# Patient Record
Sex: Male | Born: 1935 | Race: White | Hispanic: No | Marital: Single | Smoking: Former smoker
Health system: Southern US, Community
[De-identification: ages and names within clinical notes are randomized; demographics above are authoritative.]

## PROBLEM LIST (undated history)

## (undated) DIAGNOSIS — N400 Enlarged prostate without lower urinary tract symptoms: Secondary | ICD-10-CM

## (undated) DIAGNOSIS — K229 Disease of esophagus, unspecified: Secondary | ICD-10-CM

## (undated) DIAGNOSIS — J189 Pneumonia, unspecified organism: Secondary | ICD-10-CM

## (undated) DIAGNOSIS — L570 Actinic keratosis: Secondary | ICD-10-CM

## (undated) DIAGNOSIS — E440 Moderate protein-calorie malnutrition: Secondary | ICD-10-CM

## (undated) DIAGNOSIS — E785 Hyperlipidemia, unspecified: Secondary | ICD-10-CM

## (undated) DIAGNOSIS — D638 Anemia in other chronic diseases classified elsewhere: Secondary | ICD-10-CM

## (undated) DIAGNOSIS — M109 Gout, unspecified: Secondary | ICD-10-CM

## (undated) DIAGNOSIS — H25019 Cortical age-related cataract, unspecified eye: Secondary | ICD-10-CM

## (undated) DIAGNOSIS — K219 Gastro-esophageal reflux disease without esophagitis: Secondary | ICD-10-CM

## (undated) DIAGNOSIS — C801 Malignant (primary) neoplasm, unspecified: Secondary | ICD-10-CM

## (undated) DIAGNOSIS — F0390 Unspecified dementia without behavioral disturbance: Secondary | ICD-10-CM

## (undated) DIAGNOSIS — I10 Essential (primary) hypertension: Secondary | ICD-10-CM

## (undated) DIAGNOSIS — M1990 Unspecified osteoarthritis, unspecified site: Secondary | ICD-10-CM

## (undated) DIAGNOSIS — K224 Dyskinesia of esophagus: Secondary | ICD-10-CM

## (undated) DIAGNOSIS — I739 Peripheral vascular disease, unspecified: Secondary | ICD-10-CM

## (undated) HISTORY — DX: Dyskinesia of esophagus: K22.4

## (undated) HISTORY — PX: FOOT SURGERY: SHX648

## (undated) HISTORY — DX: Unspecified osteoarthritis, unspecified site: M19.90

## (undated) HISTORY — DX: Essential (primary) hypertension: I10

## (undated) HISTORY — DX: Hyperlipidemia, unspecified: E78.5

## (undated) HISTORY — DX: Gastro-esophageal reflux disease without esophagitis: K21.9

## (undated) HISTORY — PX: ESOPHAGOGASTRODUODENOSCOPY: SHX1529

## (undated) HISTORY — DX: Benign prostatic hyperplasia without lower urinary tract symptoms: N40.0

## (undated) HISTORY — PX: JOINT REPLACEMENT: SHX530

## (undated) HISTORY — DX: Cortical age-related cataract, unspecified eye: H25.019

## (undated) HISTORY — DX: Unspecified dementia without behavioral disturbance: F03.90

## (undated) HISTORY — PX: TONSILLECTOMY: SUR1361

## (undated) HISTORY — DX: Actinic keratosis: L57.0

## (undated) HISTORY — DX: Pneumonia, unspecified organism: J18.9

## (undated) HISTORY — DX: Peripheral vascular disease, unspecified: I73.9

## (undated) HISTORY — DX: Moderate protein-calorie malnutrition: E44.0

## (undated) HISTORY — DX: Anemia in other chronic diseases classified elsewhere: D63.8

---

## 2007-05-07 ENCOUNTER — Emergency Department: Payer: Self-pay | Admitting: Emergency Medicine

## 2007-05-08 ENCOUNTER — Other Ambulatory Visit: Payer: Self-pay

## 2008-05-20 ENCOUNTER — Emergency Department: Payer: Self-pay | Admitting: Emergency Medicine

## 2008-12-28 ENCOUNTER — Ambulatory Visit: Payer: Self-pay | Admitting: Internal Medicine

## 2009-01-15 ENCOUNTER — Ambulatory Visit: Payer: Self-pay | Admitting: Gastroenterology

## 2009-02-23 DEATH — deceased

## 2009-10-28 ENCOUNTER — Inpatient Hospital Stay: Payer: Self-pay | Admitting: Internal Medicine

## 2009-12-17 ENCOUNTER — Ambulatory Visit: Payer: Self-pay | Admitting: Gastroenterology

## 2010-02-11 ENCOUNTER — Emergency Department: Payer: Self-pay | Admitting: Emergency Medicine

## 2014-05-22 ENCOUNTER — Ambulatory Visit: Payer: Self-pay | Admitting: Gastroenterology

## 2015-08-29 ENCOUNTER — Emergency Department (HOSPITAL_COMMUNITY)
Admission: EM | Admit: 2015-08-29 | Discharge: 2015-08-29 | Disposition: A | Discharge status: Home / Self Care | Payer: Medicare Other | Source: Home / Self Care | Attending: Emergency Medicine | Admitting: Emergency Medicine

## 2015-08-29 ENCOUNTER — Encounter (HOSPITAL_COMMUNITY): Payer: Self-pay | Admitting: Emergency Medicine

## 2015-08-29 ENCOUNTER — Emergency Department (HOSPITAL_COMMUNITY): Payer: Medicare Other

## 2015-08-29 DIAGNOSIS — Z79899 Other long term (current) drug therapy: Secondary | ICD-10-CM

## 2015-08-29 DIAGNOSIS — Z7982 Long term (current) use of aspirin: Secondary | ICD-10-CM

## 2015-08-29 DIAGNOSIS — R000 Tachycardia, unspecified: Secondary | ICD-10-CM

## 2015-08-29 DIAGNOSIS — R05 Cough: Secondary | ICD-10-CM

## 2015-08-29 DIAGNOSIS — Z87891 Personal history of nicotine dependence: Secondary | ICD-10-CM | POA: Insufficient documentation

## 2015-08-29 DIAGNOSIS — R410 Disorientation, unspecified: Secondary | ICD-10-CM

## 2015-08-29 DIAGNOSIS — Z8719 Personal history of other diseases of the digestive system: Secondary | ICD-10-CM

## 2015-08-29 DIAGNOSIS — R1110 Vomiting, unspecified: Secondary | ICD-10-CM | POA: Insufficient documentation

## 2015-08-29 DIAGNOSIS — M109 Gout, unspecified: Secondary | ICD-10-CM

## 2015-08-29 DIAGNOSIS — Z859 Personal history of malignant neoplasm, unspecified: Secondary | ICD-10-CM

## 2015-08-29 DIAGNOSIS — A419 Sepsis, unspecified organism: Secondary | ICD-10-CM | POA: Diagnosis not present

## 2015-08-29 DIAGNOSIS — R112 Nausea with vomiting, unspecified: Secondary | ICD-10-CM | POA: Diagnosis not present

## 2015-08-29 HISTORY — DX: Malignant (primary) neoplasm, unspecified: C80.1

## 2015-08-29 HISTORY — DX: Gout, unspecified: M10.9

## 2015-08-29 HISTORY — DX: Disease of esophagus, unspecified: K22.9

## 2015-08-29 LAB — COMPREHENSIVE METABOLIC PANEL
ALT: 12 U/L — ABNORMAL LOW (ref 17–63)
AST: 24 U/L (ref 15–41)
Albumin: 3.6 g/dL (ref 3.5–5.0)
Alkaline Phosphatase: 78 U/L (ref 38–126)
Anion gap: 11 (ref 5–15)
BILIRUBIN TOTAL: 1 mg/dL (ref 0.3–1.2)
BUN: 11 mg/dL (ref 6–20)
CHLORIDE: 105 mmol/L (ref 101–111)
CO2: 23 mmol/L (ref 22–32)
CREATININE: 0.76 mg/dL (ref 0.61–1.24)
Calcium: 9.3 mg/dL (ref 8.9–10.3)
Glucose, Bld: 111 mg/dL — ABNORMAL HIGH (ref 65–99)
POTASSIUM: 3.7 mmol/L (ref 3.5–5.1)
Sodium: 139 mmol/L (ref 135–145)
TOTAL PROTEIN: 6.4 g/dL — AB (ref 6.5–8.1)

## 2015-08-29 LAB — CBC WITH DIFFERENTIAL/PLATELET
BASOS ABS: 0 10*3/uL (ref 0.0–0.1)
Basophils Relative: 0 %
EOS PCT: 0 %
Eosinophils Absolute: 0 10*3/uL (ref 0.0–0.7)
HEMATOCRIT: 40.3 % (ref 39.0–52.0)
Hemoglobin: 13.1 g/dL (ref 13.0–17.0)
LYMPHS ABS: 0.4 10*3/uL — AB (ref 0.7–4.0)
LYMPHS PCT: 2 %
MCH: 28.9 pg (ref 26.0–34.0)
MCHC: 32.5 g/dL (ref 30.0–36.0)
MCV: 88.8 fL (ref 78.0–100.0)
MONO ABS: 1.1 10*3/uL — AB (ref 0.1–1.0)
MONOS PCT: 6 %
NEUTROS ABS: 15.9 10*3/uL — AB (ref 1.7–7.7)
Neutrophils Relative %: 92 %
PLATELETS: 209 10*3/uL (ref 150–400)
RBC: 4.54 MIL/uL (ref 4.22–5.81)
RDW: 13.9 % (ref 11.5–15.5)
WBC: 17.4 10*3/uL — ABNORMAL HIGH (ref 4.0–10.5)

## 2015-08-29 LAB — URINE MICROSCOPIC-ADD ON

## 2015-08-29 LAB — URINALYSIS, ROUTINE W REFLEX MICROSCOPIC
Glucose, UA: NEGATIVE mg/dL
HGB URINE DIPSTICK: NEGATIVE
Ketones, ur: 15 mg/dL — AB
Leukocytes, UA: NEGATIVE
NITRITE: NEGATIVE
PROTEIN: 100 mg/dL — AB
Specific Gravity, Urine: 1.02 (ref 1.005–1.030)
pH: 7 (ref 5.0–8.0)

## 2015-08-29 LAB — I-STAT TROPONIN, ED: TROPONIN I, POC: 0.01 ng/mL (ref 0.00–0.08)

## 2015-08-29 LAB — LIPASE, BLOOD: LIPASE: 23 U/L (ref 11–51)

## 2015-08-29 MED ORDER — LEVOFLOXACIN 750 MG PO TABS: 750.0000 mg | ORAL_TABLET | Freq: Every day | ORAL | Status: DC

## 2015-08-29 MED ORDER — LEVOFLOXACIN 750 MG PO TABS
750.0000 mg | ORAL_TABLET | Freq: Once | ORAL | Status: AC
  Administered 2015-08-29: 750 mg via ORAL
  Filled 2015-08-29: qty 1

## 2015-08-29 MED ORDER — SODIUM CHLORIDE 0.9 % IV BOLUS (SEPSIS): 500.0000 mL | Freq: Once | INTRAVENOUS | Status: DC

## 2015-08-29 MED ORDER — SODIUM CHLORIDE 0.9 % IV BOLUS (SEPSIS)
500.0000 mL | Freq: Once | INTRAVENOUS | Status: AC
  Administered 2015-08-29: 500 mL via INTRAVENOUS

## 2015-08-29 NOTE — ED Provider Notes (Signed)
CSN: [id number]     Arrival date & time 08/29/15  1859 History   First MD Initiated Contact with Patient 08/29/15 1920     Chief Complaint  Patient presents with  . Altered Mental Status      HPI  82 year old male with history of hiatal hernia and gout presenting after he became transiently altered and confused at his skilled nursing facility. Per the patient's son who accompanies him to the ED, the patient was seen swerving in his powered wheelchair and at the time was confused and answered questions inappropriately (did not know where he was). This acute period of confusion lasted for about 5 minutes. Patient has now returned back to his baseline. He had no focal motor or sensory deficits at the time and has acted normal since. He has no history of stroke or TIA. Patient has had a cough for the last 2 or 3 days and has been coughing up yellow sputum. He chronically vomits up small amounts of whatever he eats or drinks, and has done so for years secondary to his hiatal hernia. He denies dysuria, frequency, abdominal pain. No chest pain or shortness of breath.  Past Medical History  Diagnosis Date  . Cancer ([place])   . Esophageal abnormality   . Gout    History reviewed. No pertinent past surgical history. No family history on file. Social History  Substance Use Topics  . Smoking status: Former Research scientist (life sciences)  . Smokeless tobacco: None  . Alcohol Use: No    Review of Systems  Constitutional: Negative for fever, chills, activity change and appetite change.  HENT: Negative for congestion, facial swelling, rhinorrhea and sore throat.   Eyes: Negative for visual disturbance.  Respiratory: Positive for cough. Negative for shortness of breath and wheezing.   Cardiovascular: Negative for chest pain, palpitations and leg swelling.  Gastrointestinal: Positive for vomiting (chronic, at baseline). Negative for nausea, abdominal pain, diarrhea, constipation and blood in stool.  Genitourinary: Negative for  dysuria, frequency, hematuria, flank pain and difficulty urinating.  Musculoskeletal: Negative for myalgias, back pain, joint swelling, arthralgias, neck pain and neck stiffness.  Skin: Negative for rash.  Neurological: Negative for dizziness, tremors, seizures, syncope, facial asymmetry, speech difficulty, weakness, light-headedness, numbness and headaches.  Psychiatric/Behavioral: Positive for confusion (resolved). Negative for behavioral problems and agitation.      Allergies  Review of patient's allergies indicates no known allergies.  Home Medications   Prior to Admission medications   Medication Sig Start Date End Date Taking? Authorizing Provider  allopurinol (ZYLOPRIM) 100 MG tablet Take 300 mg by mouth daily.  07/06/15  Yes Historical Provider, MD  aspirin EC 81 MG tablet Take 81 mg by mouth daily.   Yes Historical Provider, MD  donepezil (ARICEPT) 5 MG tablet Take 5 mg by mouth every evening. 07/27/15  Yes Historical Provider, MD  finasteride (PROSCAR) 5 MG tablet Take 5 mg by mouth daily. 06/15/15  Yes Historical Provider, MD  hyoscyamine (LEVBID) 0.375 MG 12 hr tablet Take 0.375 mg by mouth 2 (two) times daily. Cramping 08/18/15  Yes Historical Provider, MD  loratadine (CLARITIN) 10 MG tablet Take 10 mg by mouth daily.   Yes Historical Provider, MD  omeprazole (PRILOSEC) 40 MG capsule Take 40 mg by mouth daily. 08/03/15  Yes Historical Provider, MD  VITAMIN E PO Take 1 capsule by mouth every evening.   Yes Historical Provider, MD  levofloxacin (LEVAQUIN) 750 MG tablet Take 1 tablet (750 mg total) by mouth daily. 08/29/15 09/07/15  Eyal Greenhaw Algernon Huxley, MD   BP 143/85 mmHg  Pulse 85  Temp(Src) 97.6 F (36.4 C) (Oral)  Resp 16  Ht 5\' 3"  (1.6 m)  Wt 70.308 kg  BMI 27.46 kg/m2  SpO2 95% Physical Exam  Constitutional: He is oriented to person, place, and time. He appears well-developed and well-nourished. No distress.  HENT:  Head: Normocephalic and atraumatic.  Right Ear:  External ear normal.  Left Ear: External ear normal.  Nose: Nose normal.  Mouth/Throat: Oropharynx is clear and moist. No oropharyngeal exudate.  Eyes: Conjunctivae are normal. Pupils are equal, round, and reactive to light. Right eye exhibits no discharge. Left eye exhibits no discharge. No scleral icterus.  Neck: Normal range of motion. Neck supple. No tracheal deviation present.  Cardiovascular: Regular rhythm and normal heart sounds.  Exam reveals no gallop and no friction rub.   No murmur heard. tachcyardic to 110 bpm  Pulmonary/Chest: Effort normal and breath sounds normal. No respiratory distress. He has no wheezes. He has no rales.  Abdominal: Soft. Bowel sounds are normal. He exhibits no distension and no mass. There is no tenderness. There is no rebound and no guarding.  Musculoskeletal: Normal range of motion. He exhibits no edema or tenderness.  Neurological: He is alert and oriented to person, place, and time. He exhibits normal muscle tone.  5/5 strength in the upper and lower extremities, with intact sensation to light touch. Normal speech.   Skin: Skin is warm and dry. No rash noted. He is not diaphoretic.  Psychiatric: He has a normal mood and affect. His behavior is normal. Judgment and thought content normal.    ED Course  Procedures (including critical care time) Labs Review Labs Reviewed  CBC WITH DIFFERENTIAL/PLATELET - Abnormal; Notable for the following:    WBC 17.4 (*)    Neutro Abs 15.9 (*)    Lymphs Abs 0.4 (*)    Monocytes Absolute 1.1 (*)    All other components within normal limits  COMPREHENSIVE METABOLIC PANEL - Abnormal; Notable for the following:    Glucose, Bld 111 (*)    Total Protein 6.4 (*)    ALT 12 (*)    All other components within normal limits  URINALYSIS, ROUTINE W REFLEX MICROSCOPIC (NOT AT [facility]) - Abnormal; Notable for the following:    Color, Urine AMBER (*)    APPearance CLOUDY (*)    Bilirubin Urine SMALL (*)    Ketones, ur 15 (*)     Protein, ur 100 (*)    All other components within normal limits  URINE MICROSCOPIC-ADD ON - Abnormal; Notable for the following:    Squamous Epithelial / LPF 0-5 (*)    Bacteria, UA RARE (*)    All other components within normal limits  LIPASE, BLOOD  I-STAT TROPOININ, ED    Imaging Review Dg Chest 2 View  08/29/2015  CLINICAL DATA:  Nausea and vomiting for 2-3 days EXAM: CHEST  2 VIEW COMPARISON:  None. FINDINGS: Cardiac shadow is at the upper limits of normal in size. Elevation of left hemidiaphragm is noted. No focal infiltrate or sizable effusion is seen. No acute bony abnormality is noted. IMPRESSION: No active cardiopulmonary disease. Electronically Signed   By: Inez Catalina M.D.   On: 08/29/2015 21:56   Ct Head Wo Contrast  08/29/2015  CLINICAL DATA:  Confusion. EXAM: CT HEAD WITHOUT CONTRAST TECHNIQUE: Contiguous axial images were obtained from the base of the skull through the vertex without intravenous contrast. COMPARISON:  None. FINDINGS:  Atrophy and periventricular small vessel disease is identified. There are likely some old lacunar infarcts as well in the deep white matter structures. No evidence of hemorrhage, acute infarction, edema, mass effect, extra-axial fluid collection, hydrocephalus or mass lesion. The skull is unremarkable. IMPRESSION: No acute findings. Atrophy, small vessel disease and old lacunar infarcts identified. Electronically Signed   By: Aletta Edouard M.D.   On: 08/29/2015 20:53   I have personally reviewed and evaluated these images and lab results as part of my medical decision-making.   EKG Interpretation   Date/Time:  Wednesday August 29 2015 20:52:21 EDT Ventricular Rate:  107 PR Interval:  222 QRS Duration: 79 QT Interval:  286 QTC Calculation: 381 R Axis:   56 Text Interpretation:  Sinus tachycardia Prolonged PR interval Borderline  repolarization abnormality Confirmed by Christy Gentles  MD, DONALD ([id number]) on  08/29/2015 9:01:40 PM      MDM    Final diagnoses:  Confusion   Patient is generally well-appearing. Per both the patient and his son, he is back to his cognitive baseline. Patient does not remember his period of confusion earlier and states that "it was just because I was tired."  CBC reveals leukocytosis to 17.4 with left shift. Patient is noted to be coughing up thick yellow sputum. No pneumonia on chest x-ray however given leukocytosis and history of productive cough, will treat clinically for pneumonia with ten-day course of Levaquin as patient resides in a nursing facility.   Troponin is 0.01 and there are no ischemic changes on EKG. Patient denies chest pain and I do not suspect cardiac etiology as the cause of the patient's episodes of confusion. No evidence of UTI on urinalysis the patient denies dysuria or frequency. Doubt CVA or TIA as patient had no focal neurologic deficits during the episode.  He was noted to be tachycardic to 110 bpm on arrival. Patient and his son both report that he does not drink enough fluids. He was given 1 L of normal saline while in the ED with resolution of his tachycardia.  I had an extensive discussion with the patient and his son regarding the need to return to the emergency department immediately should he develop fevers, shortness of breath, recurrence of confusion, worsening symptoms. They expressed agreement and understanding with this plan and the patient was discharged in good condition.    Antonetta Clanton Algernon Huxley, MD 08/29/15 [id number]  Ripley Fraise, MD 08/29/15 2351

## 2015-08-29 NOTE — ED Notes (Signed)
Patient till off unit. Explained plan of care to son at bedside.

## 2015-08-29 NOTE — ED Notes (Signed)
Patient going to xray

## 2015-08-29 NOTE — ED Notes (Signed)
Dr Adela Glimpse explains levaquin for pneumonia, after review of xray, symptoms with cough and elevated Deysy Schabel count, antibiotic is needed at this time.

## 2015-08-29 NOTE — ED Notes (Addendum)
[facility] from [facility], per staff, nausea/vomiting X 2-3 days (chronic), then was acutely altered pta, difficulty with electric wheelchair, mental status improved quickly, per EMS, is A/O on arrival, VSS,   156/92, ST 110, CBG 113  Pupils unequal at baseline Speech normal per staff

## 2015-08-29 NOTE — ED Notes (Signed)
Spoke with Dr. Christy Gentles, no antibiotics needed for urine. Planning for discharge home.

## 2015-08-29 NOTE — ED Notes (Signed)
Dr. at the bedside. Allows patient to take home meds.

## 2015-08-29 NOTE — Discharge Instructions (Signed)
Please follow-up with your primary care doctor in 3 days for reevaluation after your emergency department visit. Return to the emergency department for confusion, lightheadedness, or new or concerning symptoms.

## 2015-08-29 NOTE — ED Notes (Signed)
reviwed plan of care with dr. Christy Gentles, Marvens Hollars count 17, patient has been to ct, awaiting results. MD acknowledges, no new orders.

## 2015-08-30 ENCOUNTER — Encounter (HOSPITAL_COMMUNITY): Payer: Self-pay | Admitting: Emergency Medicine

## 2015-08-30 ENCOUNTER — Inpatient Hospital Stay (HOSPITAL_COMMUNITY)
Admission: EM | Admit: 2015-08-30 | Discharge: 2015-09-07 | DRG: 871 | Disposition: A | Discharge status: Skilled Nursing Facility | Payer: Medicare Other | Attending: Internal Medicine | Admitting: Internal Medicine

## 2015-08-30 ENCOUNTER — Emergency Department (HOSPITAL_COMMUNITY): Payer: Medicare Other

## 2015-08-30 DIAGNOSIS — T18128A Food in esophagus causing other injury, initial encounter: Secondary | ICD-10-CM | POA: Diagnosis present

## 2015-08-30 DIAGNOSIS — A419 Sepsis, unspecified organism: Principal | ICD-10-CM | POA: Diagnosis present

## 2015-08-30 DIAGNOSIS — J9601 Acute respiratory failure with hypoxia: Secondary | ICD-10-CM

## 2015-08-30 DIAGNOSIS — E876 Hypokalemia: Secondary | ICD-10-CM | POA: Diagnosis present

## 2015-08-30 DIAGNOSIS — R609 Edema, unspecified: Secondary | ICD-10-CM

## 2015-08-30 DIAGNOSIS — R0600 Dyspnea, unspecified: Secondary | ICD-10-CM

## 2015-08-30 DIAGNOSIS — E860 Dehydration: Secondary | ICD-10-CM | POA: Diagnosis present

## 2015-08-30 DIAGNOSIS — F0390 Unspecified dementia without behavioral disturbance: Secondary | ICD-10-CM | POA: Diagnosis present

## 2015-08-30 DIAGNOSIS — N400 Enlarged prostate without lower urinary tract symptoms: Secondary | ICD-10-CM | POA: Diagnosis present

## 2015-08-30 DIAGNOSIS — F0391 Unspecified dementia with behavioral disturbance: Secondary | ICD-10-CM | POA: Diagnosis not present

## 2015-08-30 DIAGNOSIS — J690 Pneumonitis due to inhalation of food and vomit: Secondary | ICD-10-CM

## 2015-08-30 DIAGNOSIS — M109 Gout, unspecified: Secondary | ICD-10-CM | POA: Diagnosis present

## 2015-08-30 DIAGNOSIS — L89151 Pressure ulcer of sacral region, stage 1: Secondary | ICD-10-CM | POA: Diagnosis present

## 2015-08-30 DIAGNOSIS — J189 Pneumonia, unspecified organism: Secondary | ICD-10-CM | POA: Diagnosis present

## 2015-08-30 DIAGNOSIS — Z66 Do not resuscitate: Secondary | ICD-10-CM | POA: Diagnosis present

## 2015-08-30 DIAGNOSIS — L8990 Pressure ulcer of unspecified site, unspecified stage: Secondary | ICD-10-CM | POA: Diagnosis present

## 2015-08-30 DIAGNOSIS — T17908A Unspecified foreign body in respiratory tract, part unspecified causing other injury, initial encounter: Secondary | ICD-10-CM

## 2015-08-30 DIAGNOSIS — K449 Diaphragmatic hernia without obstruction or gangrene: Secondary | ICD-10-CM | POA: Diagnosis present

## 2015-08-30 DIAGNOSIS — K224 Dyskinesia of esophagus: Secondary | ICD-10-CM | POA: Diagnosis present

## 2015-08-30 DIAGNOSIS — Z87891 Personal history of nicotine dependence: Secondary | ICD-10-CM | POA: Diagnosis not present

## 2015-08-30 DIAGNOSIS — R1310 Dysphagia, unspecified: Secondary | ICD-10-CM

## 2015-08-30 DIAGNOSIS — H9193 Unspecified hearing loss, bilateral: Secondary | ICD-10-CM | POA: Diagnosis present

## 2015-08-30 DIAGNOSIS — K222 Esophageal obstruction: Secondary | ICD-10-CM | POA: Diagnosis present

## 2015-08-30 DIAGNOSIS — R1319 Other dysphagia: Secondary | ICD-10-CM

## 2015-08-30 DIAGNOSIS — R000 Tachycardia, unspecified: Secondary | ICD-10-CM

## 2015-08-30 DIAGNOSIS — R112 Nausea with vomiting, unspecified: Secondary | ICD-10-CM | POA: Diagnosis present

## 2015-08-30 DIAGNOSIS — R410 Disorientation, unspecified: Secondary | ICD-10-CM

## 2015-08-30 HISTORY — DX: Pneumonia, unspecified organism: J18.9

## 2015-08-30 LAB — COMPREHENSIVE METABOLIC PANEL
ALBUMIN: 4 g/dL (ref 3.5–5.0)
ALK PHOS: 84 U/L (ref 38–126)
ALT: 13 U/L — AB (ref 17–63)
ANION GAP: 9 (ref 5–15)
AST: 23 U/L (ref 15–41)
BILIRUBIN TOTAL: 1.4 mg/dL — AB (ref 0.3–1.2)
BUN: 19 mg/dL (ref 6–20)
CALCIUM: 9.7 mg/dL (ref 8.9–10.3)
CO2: 25 mmol/L (ref 22–32)
CREATININE: 0.82 mg/dL (ref 0.61–1.24)
Chloride: 106 mmol/L (ref 101–111)
GFR calc Af Amer: >60 mL/min (ref >60)
GFR calc non Af Amer: >60 mL/min (ref >60)
GLUCOSE: 133 mg/dL — AB (ref 65–99)
Potassium: 4 mmol/L (ref 3.5–5.1)
Sodium: 140 mmol/L (ref 135–145)
TOTAL PROTEIN: 7 g/dL (ref 6.5–8.1)

## 2015-08-30 LAB — URINALYSIS, ROUTINE W REFLEX MICROSCOPIC
BILIRUBIN URINE: NEGATIVE
Glucose, UA: NEGATIVE mg/dL
Hgb urine dipstick: NEGATIVE
Ketones, ur: NEGATIVE mg/dL
LEUKOCYTES UA: NEGATIVE
NITRITE: NEGATIVE
PROTEIN: 30 mg/dL — AB
Specific Gravity, Urine: 1.026 (ref 1.005–1.030)
pH: 6 (ref 5.0–8.0)

## 2015-08-30 LAB — I-STAT CG4 LACTIC ACID, ED
Lactic Acid, Venous: 1.71 mmol/L (ref 0.5–2.0)
Lactic Acid, Venous: 2.19 mmol/L (ref 0.5–2.0)

## 2015-08-30 LAB — CBC WITH DIFFERENTIAL/PLATELET
BASOS ABS: 0 10*3/uL (ref 0.0–0.1)
BASOS PCT: 0 %
EOS ABS: 0 10*3/uL (ref 0.0–0.7)
EOS PCT: 0 %
HEMATOCRIT: 42.9 % (ref 39.0–52.0)
Hemoglobin: 14.3 g/dL (ref 13.0–17.0)
Lymphocytes Relative: 2 %
Lymphs Abs: 0.5 10*3/uL — ABNORMAL LOW (ref 0.7–4.0)
MCH: 29.9 pg (ref 26.0–34.0)
MCHC: 33.3 g/dL (ref 30.0–36.0)
MCV: 89.6 fL (ref 78.0–100.0)
MONO ABS: 0.9 10*3/uL (ref 0.1–1.0)
Monocytes Relative: 4 %
NEUTROS ABS: 18.8 10*3/uL — AB (ref 1.7–7.7)
Neutrophils Relative %: 94 %
PLATELETS: 243 10*3/uL (ref 150–400)
RBC: 4.79 MIL/uL (ref 4.22–5.81)
RDW: 14.3 % (ref 11.5–15.5)
WBC: 20.1 10*3/uL — ABNORMAL HIGH (ref 4.0–10.5)

## 2015-08-30 LAB — I-STAT TROPONIN, ED
TROPONIN I, POC: 0.01 ng/mL (ref 0.00–0.08)
TROPONIN I, POC: 0.01 ng/mL (ref 0.00–0.08)

## 2015-08-30 LAB — URINE MICROSCOPIC-ADD ON

## 2015-08-30 LAB — MRSA PCR SCREENING: MRSA BY PCR: NEGATIVE

## 2015-08-30 LAB — BRAIN NATRIURETIC PEPTIDE: B NATRIURETIC PEPTIDE 5: 120.6 pg/mL — AB (ref 0.0–100.0)

## 2015-08-30 MED ORDER — FLUTICASONE PROPIONATE 50 MCG/ACT NA SUSP
1.0000 | Freq: Every day | NASAL | Status: DC | PRN
  Filled 2015-08-30: qty 16

## 2015-08-30 MED ORDER — ONDANSETRON HCL 4 MG/2ML IJ SOLN
4.0000 mg | Freq: Four times a day (QID) | INTRAMUSCULAR | Status: DC
  Administered 2015-08-31 – 2015-09-07 (×23): 4 mg via INTRAVENOUS
  Filled 2015-08-30 (×23): qty 2

## 2015-08-30 MED ORDER — GUAIFENESIN ER 600 MG PO TB12
600.0000 mg | ORAL_TABLET | Freq: Two times a day (BID) | ORAL | Status: DC
  Administered 2015-08-30: 600 mg via ORAL
  Filled 2015-08-30 (×3): qty 1

## 2015-08-30 MED ORDER — LEVALBUTEROL HCL 0.63 MG/3ML IN NEBU
0.6300 mg | INHALATION_SOLUTION | Freq: Once | RESPIRATORY_TRACT | Status: AC
  Administered 2015-08-30: 0.63 mg via RESPIRATORY_TRACT
  Filled 2015-08-30: qty 3

## 2015-08-30 MED ORDER — SODIUM CHLORIDE 0.9 % IV BOLUS (SEPSIS)
500.0000 mL | INTRAVENOUS | Status: AC
  Administered 2015-08-30: 500 mL via INTRAVENOUS

## 2015-08-30 MED ORDER — PANTOPRAZOLE SODIUM 40 MG IV SOLR
40.0000 mg | Freq: Two times a day (BID) | INTRAVENOUS | Status: DC
  Administered 2015-08-31 – 2015-09-04 (×9): 40 mg via INTRAVENOUS
  Filled 2015-08-30 (×12): qty 40

## 2015-08-30 MED ORDER — LORATADINE 10 MG PO TABS
10.0000 mg | ORAL_TABLET | Freq: Every day | ORAL | Status: DC
  Administered 2015-08-30: 10 mg via ORAL
  Filled 2015-08-30 (×2): qty 1

## 2015-08-30 MED ORDER — SODIUM CHLORIDE 0.9 % IV BOLUS (SEPSIS)
1000.0000 mL | INTRAVENOUS | Status: AC
  Administered 2015-08-30: 1000 mL via INTRAVENOUS

## 2015-08-30 MED ORDER — FINASTERIDE 5 MG PO TABS
5.0000 mg | ORAL_TABLET | Freq: Every day | ORAL | Status: DC
  Filled 2015-08-30: qty 1

## 2015-08-30 MED ORDER — ENOXAPARIN SODIUM 40 MG/0.4ML ~~LOC~~ SOLN
40.0000 mg | SUBCUTANEOUS | Status: DC
Start: 2015-08-30 — End: 2015-08-31
  Administered 2015-08-30: 40 mg via SUBCUTANEOUS
  Filled 2015-08-30 (×2): qty 0.4

## 2015-08-30 MED ORDER — VANCOMYCIN HCL IN DEXTROSE 750-5 MG/150ML-% IV SOLN
750.0000 mg | Freq: Two times a day (BID) | INTRAVENOUS | Status: DC
  Administered 2015-08-31: 750 mg via INTRAVENOUS
  Filled 2015-08-30: qty 150

## 2015-08-30 MED ORDER — PIPERACILLIN-TAZOBACTAM 3.375 G IVPB 30 MIN
3.3750 g | Freq: Once | INTRAVENOUS | Status: AC
  Administered 2015-08-30: 3.375 g via INTRAVENOUS
  Filled 2015-08-30: qty 50

## 2015-08-30 MED ORDER — IPRATROPIUM BROMIDE 0.02 % IN SOLN: 0.5000 mg | RESPIRATORY_TRACT | Status: DC | PRN

## 2015-08-30 MED ORDER — ASPIRIN EC 81 MG PO TBEC
81.0000 mg | DELAYED_RELEASE_TABLET | Freq: Every day | ORAL | Status: DC
  Filled 2015-08-30: qty 1

## 2015-08-30 MED ORDER — DONEPEZIL HCL 5 MG PO TABS
5.0000 mg | ORAL_TABLET | Freq: Every day | ORAL | Status: DC
  Administered 2015-08-30: 5 mg via ORAL
  Filled 2015-08-30 (×2): qty 1

## 2015-08-30 MED ORDER — ALLOPURINOL 300 MG PO TABS
300.0000 mg | ORAL_TABLET | Freq: Every day | ORAL | Status: DC
  Filled 2015-08-30: qty 1

## 2015-08-30 MED ORDER — RISAQUAD PO CAPS
1.0000 | ORAL_CAPSULE | Freq: Every day | ORAL | Status: DC
  Filled 2015-08-30: qty 1

## 2015-08-30 MED ORDER — VANCOMYCIN HCL IN DEXTROSE 1-5 GM/200ML-% IV SOLN
1000.0000 mg | Freq: Once | INTRAVENOUS | Status: AC
  Administered 2015-08-30: 1000 mg via INTRAVENOUS
  Filled 2015-08-30: qty 200

## 2015-08-30 MED ORDER — HYOSCYAMINE SULFATE ER 0.375 MG PO TB12
0.3750 mg | ORAL_TABLET | Freq: Two times a day (BID) | ORAL | Status: DC
  Administered 2015-08-30 – 2015-09-04 (×8): 0.375 mg via ORAL
  Filled 2015-08-30 (×11): qty 1

## 2015-08-30 MED ORDER — PIPERACILLIN-TAZOBACTAM 3.375 G IVPB
3.3750 g | Freq: Three times a day (TID) | INTRAVENOUS | Status: DC
  Administered 2015-08-31 (×2): 3.375 g via INTRAVENOUS
  Filled 2015-08-30 (×3): qty 50

## 2015-08-30 MED ORDER — ADULT MULTIVITAMIN W/MINERALS CH
1.0000 | ORAL_TABLET | Freq: Every day | ORAL | Status: DC
  Administered 2015-08-30: 1 via ORAL
  Filled 2015-08-30 (×2): qty 1

## 2015-08-30 MED ORDER — SODIUM CHLORIDE 0.9 % IV SOLN
INTRAVENOUS | Status: DC
  Administered 2015-08-30 – 2015-09-03 (×5): via INTRAVENOUS

## 2015-08-30 MED ORDER — PANTOPRAZOLE SODIUM 40 MG PO TBEC
40.0000 mg | DELAYED_RELEASE_TABLET | Freq: Two times a day (BID) | ORAL | Status: DC
  Administered 2015-08-30: 40 mg via ORAL
  Filled 2015-08-30: qty 1

## 2015-08-30 MED ORDER — LEVALBUTEROL HCL 0.63 MG/3ML IN NEBU: 0.6300 mg | INHALATION_SOLUTION | RESPIRATORY_TRACT | Status: DC | PRN

## 2015-08-30 NOTE — ED Notes (Signed)
Emesis is similar to phlegm. Patient coughs then spits.

## 2015-08-30 NOTE — ED Notes (Signed)
Pt desat on 2L oxygen increased to 3L now 100% previous sat 82

## 2015-08-30 NOTE — Progress Notes (Signed)
EDCM spoke to patient's son at bedside Jack Cantrell [phone] or [phone].  Patient is a resident at the [facility] on [address]. Per patient's son.  Patient lives in the [place] living section of the facility.  Patient's son reports patient receives all of his meals there.  Patient's son patient has a power chair and a chair lift at home.  Patient's son reports patient is unable to walk.  Patient's son reports he is at the facility every day assisting patient with his ADL's.  Patient's son reports patient does not have home health services.  He reports [name] is at the facility at takes patient's blood pressure weekly.  Patient has oxygen at home, he just doesn't use it per patient's son.  Patient has had oxygen at home for six months.  Patient's son reports patient has a concentrator and nasal cannula at home.  Patient's son rpeorts the patient was wearing the oxygen for a little while but then just stopped wearing it.  Patient's son reports the plan is to return to the patient's facility without a change in level of care.  Patient and patient's son are pleased with this facility.

## 2015-08-30 NOTE — H&P (Signed)
[facility] History and Physical  AMJED STRIKE [id number] DOB: 02-16-1915 DOA: 08/30/2015  Referring physician: ED PCP: Kirk Ruths., MD   Chief Complaint: Nausea and emesis  HPI:  Mr. Willhoite is a 82 year old male with a past medical history of a hiatal hernia, gout, nutcracker esophagus; who presents from the [facility] facility with a 4 day history of coughing. appearing to be dehydrated and confused. Was seen in the ED yesterday and thought to have pneumonia for which he was given Levaquin. He only took about 2 doses. Son notes that he is coughing and spitting up anytime he tries to eat or drink anything. Denies vomiting blood. This morning he had red Jell-O around 11 AM, but still appears to be spitting up thick reddish material. Son notes that he has previously been treated for esophageal dysmotility with a dilation in his early 35s and subsequently was advised to eat a repeat dilation for which the family declined due to his age. Putting him under general anesthesia. History of esophageal nutcracker is being currently treated with Hyoscyamine. Due to the patient's frequent coughing he's had decreased overall appetite and generalized weakness. Denies any chest pain, shortness of breath, fever, or diarrhea.  Upon arrival patient is evaluated and had a repeat chest x-ray which showed worsening bilateral pulmonary infiltrates. BNP was seen to be slightly elevated at 120.6. Initial lactic acid was 1.7 1 repeat was 2.19.  Review of Systems  Constitutional: Positive for chills and malaise/fatigue. Negative for fever.       Decreased appetite  HENT: Positive for hearing loss. Negative for tinnitus.   Eyes: Negative for pain and discharge.  Respiratory: Positive for cough and sputum production. Negative for hemoptysis and shortness of breath.   Cardiovascular: Negative for chest pain, palpitations and leg swelling.  Gastrointestinal: Positive for vomiting and  constipation. Negative for abdominal pain and diarrhea.  Genitourinary: Negative for urgency and frequency.  Musculoskeletal: Positive for back pain. Negative for neck pain.  Skin: Negative for itching and rash.  Neurological: Positive for tremors and weakness. Negative for sensory change.  Endo/Heme/Allergies: Negative for environmental allergies. Does not bruise/bleed easily.  Psychiatric/Behavioral: Positive for memory loss. Negative for hallucinations.     Past Medical History  Diagnosis Date  . Cancer ([place])   . Esophageal abnormality   . Gout      History reviewed. No pertinent past surgical history.    Social History:  reports that he has quit smoking. He does not have any smokeless tobacco history on file. He reports that he does not drink alcohol. His drug history is not on file. Where does patient live--SNF   Can patient participate in ADLs? Needs some assistance  No Known Allergies  No family history on file.      Prior to Admission medications   Medication Sig Start Date End Date Taking? Authorizing Provider  allopurinol (ZYLOPRIM) 300 MG tablet Take 300 mg by mouth daily.   Yes Historical Provider, MD  aspirin EC 81 MG tablet Take 81 mg by mouth daily.   Yes Historical Provider, MD  donepezil (ARICEPT) 5 MG tablet Take 5 mg by mouth every evening. 07/27/15  Yes Historical Provider, MD  finasteride (PROSCAR) 5 MG tablet Take 5 mg by mouth daily. 06/15/15  Yes Historical Provider, MD  hyoscyamine (LEVBID) 0.375 MG 12 hr tablet Take 0.375 mg by mouth 2 (two) times daily. Cramping 08/18/15  Yes Historical Provider, MD  levofloxacin (LEVAQUIN) 750 MG tablet Take 1 tablet (750  mg total) by mouth daily. 08/29/15 09/07/15 Yes Jenifer Algernon Huxley, MD  loratadine (CLARITIN) 10 MG tablet Take 10 mg by mouth daily.   Yes Historical Provider, MD  mometasone (NASONEX) 50 MCG/ACT nasal spray Place 1 spray into the nose daily as needed (cold symptoms).   Yes Historical Provider, MD   Multiple Vitamin (MULTIVITAMIN WITH MINERALS) TABS tablet Take 1 tablet by mouth daily.   Yes Historical Provider, MD  omeprazole (PRILOSEC) 40 MG capsule Take 40 mg by mouth daily. 08/03/15  Yes Historical Provider, MD  Probiotic CAPS Take 1 capsule by mouth daily.   Yes Historical Provider, MD     Physical Exam: Filed Vitals:   08/30/15 1619 08/30/15 1631 08/30/15 1735 08/30/15 1941  BP: 163/82  153/79 159/147  Pulse: 94  97 98  Temp:  98.5 F (36.9 C)    TempSrc:  Rectal    Resp: 18  18 20   SpO2: 100%  100% 98%     Constitutional: Vital signs reviewed. Patient is a elderly male who appears to be in some mild distress coughing. Head: Normocephalic and atraumatic  Ear: TM normal bilaterally  Mouth: no erythema or exudates, MMM  Eyes: PERRL, EOMI, conjunctivae normal, No scleral icterus.  Neck: Supple, Trachea midline normal ROM, No JVD, mass, thyromegaly, or carotid bruit present.  Cardiovascular: RRR, S1 normal, S2 normal, no MRG, pulses symmetric and intact bilaterally  Pulmonary/Chest: Frequently coughing up a thick reddish material which tested guaiac negative. Diffuse rhonchi appreciated and both lung fields. Abdominal: Soft. Non-tender, non-distended, bowel sounds are normal, no masses, organomegaly, or guarding present.   GU: no CVA tenderness Musculoskeletal: No joint deformities, erythema, or stiffness, ROM full and no nontender Ext: no edema and no cyanosis, pulses palpable bilaterally (DP and PT)  Hematology: no cervical, inginal, or axillary adenopathy.  Neurological: A&O x3, Strenght is normal and symmetric bilaterally, cranial nerve II-XII are grossly intact, no focal motor deficit, sensory intact to light touch bilaterally.  Skin: Warm, dry and intact. No rash, cyanosis, or clubbing.  Psychiatric: Normal mood and affect. speech and behavior is normal. Judgment and thought content normal. Cognition and memory are normal.      Data Review   Micro Results No  results found for this or any previous visit (from the past 240 hour(s)).  Radiology Reports Dg Chest 2 View  08/30/2015  CLINICAL DATA:  Nausea, vomiting and cough. EXAM: CHEST  2 VIEW COMPARISON:  None. FINDINGS: Normal heart size. Aortic atherosclerosis is identified. Decreased lung volumes with asymmetric elevation of the left hemidiaphragm. Diffuse opacities are identified in both lungs. Are identified bilaterally which may reflect multifocal pneumonia and or edema. IMPRESSION: 1. Diffuse bilateral pulmonary opacities compatible with edema versus multifocal infection. Electronically Signed   By: Kerby Moors M.D.   On: 08/30/2015 17:14   Dg Chest 2 View  08/29/2015  CLINICAL DATA:  Nausea and vomiting for 2-3 days EXAM: CHEST  2 VIEW COMPARISON:  None. FINDINGS: Cardiac shadow is at the upper limits of normal in size. Elevation of left hemidiaphragm is noted. No focal infiltrate or sizable effusion is seen. No acute bony abnormality is noted. IMPRESSION: No active cardiopulmonary disease. Electronically Signed   By: Inez Catalina M.D.   On: 08/29/2015 21:56   Ct Head Wo Contrast  08/29/2015  CLINICAL DATA:  Confusion. EXAM: CT HEAD WITHOUT CONTRAST TECHNIQUE: Contiguous axial images were obtained from the base of the skull through the vertex without intravenous contrast. COMPARISON:  None.  FINDINGS: Atrophy and periventricular small vessel disease is identified. There are likely some old lacunar infarcts as well in the deep white matter structures. No evidence of hemorrhage, acute infarction, edema, mass effect, extra-axial fluid collection, hydrocephalus or mass lesion. The skull is unremarkable. IMPRESSION: No acute findings. Atrophy, small vessel disease and old lacunar infarcts identified. Electronically Signed   By: Aletta Edouard M.D.   On: 08/29/2015 20:53     CBC  Recent Labs Lab 08/29/15 2025 08/30/15 1533  WBC 17.4* 20.1*  HGB 13.1 14.3  HCT 40.3 42.9  PLT 209 243  MCV 88.8 89.6   MCH 28.9 29.9  MCHC 32.5 33.3  RDW 13.9 14.3  LYMPHSABS 0.4* 0.5*  MONOABS 1.1* 0.9  EOSABS 0.0 0.0  BASOSABS 0.0 0.0    Chemistries   Recent Labs Lab 08/29/15 2025 08/30/15 1533  NA 139 140  K 3.7 4.0  CL 105 106  CO2 23 25  GLUCOSE 111* 133*  BUN 11 19  CREATININE 0.76 0.82  CALCIUM 9.3 9.7  AST 24 23  ALT 12* 13*  ALKPHOS 78 84  BILITOT 1.0 1.4*   ------------------------------------------------------------------------------------------------------------------ estimated creatinine clearance is 43.3 mL/min (by C-G formula based on Cr of 0.82). ------------------------------------------------------------------------------------------------------------------ No results for input(s): HGBA1C in the last 72 hours. ------------------------------------------------------------------------------------------------------------------ No results for input(s): CHOL, HDL, LDLCALC, TRIG, CHOLHDL, LDLDIRECT in the last 72 hours. ------------------------------------------------------------------------------------------------------------------ No results for input(s): TSH, T4TOTAL, T3FREE, THYROIDAB in the last 72 hours.  Invalid input(s): FREET3 ------------------------------------------------------------------------------------------------------------------ No results for input(s): VITAMINB12, FOLATE, FERRITIN, TIBC, IRON, RETICCTPCT in the last 72 hours.  Coagulation profile No results for input(s): INR, PROTIME in the last 168 hours.  No results for input(s): DDIMER in the last 72 hours.  Cardiac Enzymes No results for input(s): CKMB, TROPONINI, MYOGLOBIN in the last 168 hours.  Invalid input(s): CK ------------------------------------------------------------------------------------------------------------------ Invalid input(s): POCBNP   CBG: No results for input(s): GLUCAP in the last 168 hours.     EKG: Independently reviewed. Sinus  tachycardia   Assessment/Plan Sepsis secondary to Suspected Pneumonia: Acute. Patient reported to be coughing over the last 4 days previously treated as an outpatient with Levaquin. Patient developing worsening coughing/spitting up unable to tolerate po food or liquids. Initial white blood cell count 20.1, lactic acid 2.19, and heart rate up to 101 on EKG. Chest x-ray showing possible worsening bilateral infiltrates. Question healthcare associated versus aspiration pneumonia. - Admit to telemetry - Antibiotics of vancomycin and Zosyn - NS @ 75 cc/hr - Fever control prn tylenol - Repeat CBC in am - Follow-up sputum and blood cultures - DuoNeb' - mucinex  Cough/nausea and vomiting: Acute. Unclear if the patient has possibly aspirating food versus having nausea and vomiting and/or possibly aspirating. 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 for which she is on hyoscyamine  - Speech therapy consulted for barium esophagram - Will likely benefit from a consult to GI prior to trying any additional therapies  Benign prostatic hypertrophy - Continue Proscar when able  Dementia - Continue Aricept when able  History of gout  - Continue allopurinol when able  Jerrye Bushy - changed Protonix to IV  Pressure ulcer - Wound care consult - Low air mattress  Code Status:   Full for now, but will need to  address CODE STATUS  Family Communication: bedside Disposition Plan: admit   Total time spent 55 minutes.Greater than 50% of this time was spent in counseling, explanation of diagnosis, planning of further  management, and coordination of care  [facility] Pager [phone]  If 7PM-7AM, please contact night-coverage [email or web address] Password [facility] 08/30/2015, 8:18 PM

## 2015-08-30 NOTE — ED Provider Notes (Signed)
CSN: [id number]     Arrival date & time 08/30/15  1440 History   First MD Initiated Contact with Patient 08/30/15 1502     Chief Complaint  Patient presents with  . Nausea  . Emesis     (Consider location/radiation/quality/duration/timing/severity/associated sxs/prior Treatment) HPI Comments: [facility] reported he was confused, running into things with walker Productive cough, yellow/brown sputum Sunday was 89th birthday 4 days ago began to have emesis,  Appeared dehydrated and confused Went to hospital last night Felt clinically to have pneumonia and given levaquin  Cough since Tuesday, became productive last 2 days  Hx of nutcracker esophagus, 29yrs ago, having nausea/vomiting  Emesis every time he tries to eat or drink, not vomiting blood, vomiting mucous    Patient is a 82 y.o. male presenting with vomiting.  Emesis Associated symptoms: chills   Associated symptoms: no abdominal pain, no diarrhea, no headaches and no sore throat     Past Medical History  Diagnosis Date  . Cancer ([place])   . Esophageal abnormality   . Gout    History reviewed. No pertinent past surgical history. No family history on file. Social History  Substance Use Topics  . Smoking status: Former Research scientist (life sciences)  . Smokeless tobacco: None  . Alcohol Use: No    Review of Systems  Constitutional: Positive for chills and appetite change. Negative for fever.  HENT: Negative for sore throat.   Eyes: Negative for visual disturbance.  Respiratory: Positive for cough. Negative for shortness of breath.   Cardiovascular: Negative for chest pain.  Gastrointestinal: Positive for nausea, vomiting and constipation. Negative for abdominal pain and diarrhea.  Genitourinary: Negative for dysuria and difficulty urinating.  Musculoskeletal: Positive for back pain ("bottom of spine"). Negative for neck stiffness.  Skin: Negative for rash.  Neurological: Negative for syncope and  headaches.      Allergies  Review of patient's allergies indicates no known allergies.  Home Medications   Prior to Admission medications   Medication Sig Start Date End Date Taking? Authorizing Provider  allopurinol (ZYLOPRIM) 300 MG tablet Take 300 mg by mouth daily.   Yes Historical Provider, MD  aspirin EC 81 MG tablet Take 81 mg by mouth daily.   Yes Historical Provider, MD  donepezil (ARICEPT) 5 MG tablet Take 5 mg by mouth every evening. 07/27/15  Yes Historical Provider, MD  finasteride (PROSCAR) 5 MG tablet Take 5 mg by mouth daily. 06/15/15  Yes Historical Provider, MD  hyoscyamine (LEVBID) 0.375 MG 12 hr tablet Take 0.375 mg by mouth 2 (two) times daily. Cramping 08/18/15  Yes Historical Provider, MD  levofloxacin (LEVAQUIN) 750 MG tablet Take 1 tablet (750 mg total) by mouth daily. 08/29/15 09/07/15 Yes Jenifer Algernon Huxley, MD  loratadine (CLARITIN) 10 MG tablet Take 10 mg by mouth daily.   Yes Historical Provider, MD  mometasone (NASONEX) 50 MCG/ACT nasal spray Place 1 spray into the nose daily as needed (cold symptoms).   Yes Historical Provider, MD  Multiple Vitamin (MULTIVITAMIN WITH MINERALS) TABS tablet Take 1 tablet by mouth daily.   Yes Historical Provider, MD  omeprazole (PRILOSEC) 40 MG capsule Take 40 mg by mouth daily. 08/03/15  Yes Historical Provider, MD  Probiotic CAPS Take 1 capsule by mouth daily.   Yes Historical Provider, MD   BP 159/147 mmHg  Pulse 98  Temp(Src) 98.5 F (36.9 C) (Rectal)  Resp 20  Wt 135 lb 9.6 oz (61.508 kg)  SpO2 97% Physical Exam  Constitutional: He  is oriented to person, place, and time. He appears well-developed and well-nourished. No distress.  HENT:  Head: Normocephalic and atraumatic.  Eyes: Conjunctivae and EOM are normal.  Neck: Normal range of motion.  Cardiovascular: Normal rate, regular rhythm, normal heart sounds and intact distal pulses.  Exam reveals no gallop and no friction rub.   No murmur heard. Pulmonary/Chest:  Effort normal. No respiratory distress. He has no wheezes. He has rhonchi (RLL). He has no rales.  Abdominal: Soft. He exhibits no distension. There is no tenderness. There is no guarding.  Musculoskeletal: He exhibits no edema.  Neurological: He is alert and oriented to person, place, and time.  Skin: Skin is warm and dry. He is not diaphoretic.  Nursing note and vitals reviewed.   ED Course  Procedures (including critical care time) Labs Review Labs Reviewed  COMPREHENSIVE METABOLIC PANEL - Abnormal; Notable for the following:    Glucose, Bld 133 (*)    ALT 13 (*)    Total Bilirubin 1.4 (*)    All other components within normal limits  CBC WITH DIFFERENTIAL/PLATELET - Abnormal; Notable for the following:    WBC 20.1 (*)    Neutro Abs 18.8 (*)    Lymphs Abs 0.5 (*)    All other components within normal limits  URINALYSIS, ROUTINE W REFLEX MICROSCOPIC (NOT AT [facility]) - Abnormal; Notable for the following:    Color, Urine AMBER (*)    Protein, ur 30 (*)    All other components within normal limits  BRAIN NATRIURETIC PEPTIDE - Abnormal; Notable for the following:    B Natriuretic Peptide 120.6 (*)    All other components within normal limits  URINE MICROSCOPIC-ADD ON - Abnormal; Notable for the following:    Squamous Epithelial / LPF 0-5 (*)    Bacteria, UA RARE (*)    All other components within normal limits  I-STAT CG4 LACTIC ACID, ED - Abnormal; Notable for the following:    Lactic Acid, Venous 2.19 (*)    All other components within normal limits  MRSA PCR SCREENING  CULTURE, BLOOD (ROUTINE X 2)  CULTURE, BLOOD (ROUTINE X 2)  URINE CULTURE  CULTURE, EXPECTORATED SPUTUM-ASSESSMENT  GRAM STAIN  STREP PNEUMONIAE URINARY ANTIGEN  I-STAT CG4 LACTIC ACID, ED  I-STAT TROPOININ, ED  I-STAT TROPOININ, ED  Randolm Idol, ED    Imaging Review Dg Chest 2 View  08/30/2015  CLINICAL DATA:  Nausea, vomiting and cough. EXAM: CHEST  2 VIEW COMPARISON:  None. FINDINGS: Normal  heart size. Aortic atherosclerosis is identified. Decreased lung volumes with asymmetric elevation of the left hemidiaphragm. Diffuse opacities are identified in both lungs. Are identified bilaterally which may reflect multifocal pneumonia and or edema. IMPRESSION: 1. Diffuse bilateral pulmonary opacities compatible with edema versus multifocal infection. Electronically Signed   By: Kerby Moors M.D.   On: 08/30/2015 17:14   Dg Chest 2 View  08/29/2015  CLINICAL DATA:  Nausea and vomiting for 2-3 days EXAM: CHEST  2 VIEW COMPARISON:  None. FINDINGS: Cardiac shadow is at the upper limits of normal in size. Elevation of left hemidiaphragm is noted. No focal infiltrate or sizable effusion is seen. No acute bony abnormality is noted. IMPRESSION: No active cardiopulmonary disease. Electronically Signed   By: Inez Catalina M.D.   On: 08/29/2015 21:56   Ct Head Wo Contrast  08/29/2015  CLINICAL DATA:  Confusion. EXAM: CT HEAD WITHOUT CONTRAST TECHNIQUE: Contiguous axial images were obtained from the base of the skull through the vertex  without intravenous contrast. COMPARISON:  None. FINDINGS: Atrophy and periventricular small vessel disease is identified. There are likely some old lacunar infarcts as well in the deep white matter structures. No evidence of hemorrhage, acute infarction, edema, mass effect, extra-axial fluid collection, hydrocephalus or mass lesion. The skull is unremarkable. IMPRESSION: No acute findings. Atrophy, small vessel disease and old lacunar infarcts identified. Electronically Signed   By: Aletta Edouard M.D.   On: 08/29/2015 20:53   I have personally reviewed and evaluated these images and lab results as part of my medical decision-making.   EKG Interpretation   Date/Time:  Thursday August 30 2015 15:27:42 EDT Ventricular Rate:  101 PR Interval:  215 QRS Duration: 77 QT Interval:  372 QTC Calculation: 482 R Axis:   52 Text Interpretation:  Sinus tachycardia Atrial premature  complex  Borderline prolonged PR interval Abnormal R-wave progression, early  transition Borderline T abnormalities, lateral leads Borderline prolonged  QT interval No significant change since last tracing Confirmed by  [facility] MD, Shankar Silber ([id number]) on 08/30/2015 4:31:21 PM      MDM   Final diagnoses:  Aspiration pneumonia, unspecified aspiration pneumonia type  Acute respiratory failure with hypoxia (HCC)  Tachycardia  Disorientation   82 year old male with a history of gout and nutcracker esophagus presents with concern for 3 days of cough, emesis, and confusion. Patient was evaluated in the emergency department last night, and was given a clinical diagnosis of pneumonia with concern of leukocytosis of 17,000 and a productive cough, and he was started on Levaquin. Son reports since this time, every time he tries to eat or drink, he vomits.   Patient tachycardic and hypoxic to 88% on room air and evaluation in the emergency department. Chest x-ray from yesterday reviewed, and does not show signs of pneumonia, however feel clinically with cough, hypoxia and tachycardia, patient may be developing sepsis secondary to pneumonia. Pt in independent living, with symptoms concerning for aspiration. Vancomycin and Zosyn were ordered, and patient was given 30 mL/kg of IV fluids. Urinalysis shows no sign of UTI. White count today is 20,000. Lactic acid is within normal limits.  XR today shows concern for bilateral pneumonia.    Gareth Morgan, MD 08/31/15 [phone]

## 2015-08-30 NOTE — ED Notes (Signed)
[place], MD given patient lactic result.

## 2015-08-30 NOTE — ED Notes (Addendum)
Per EMS with n/v x2 days recent dx of pneumonia and is on Levaquin. Unable to keep much down, denies pain.   Received 4 mg Zofran and 200 ml fluid in route

## 2015-08-30 NOTE — ED Notes (Signed)
Bed: [id number] Expected date:  Expected time:  Means of arrival:  Comments: 34M/n/v

## 2015-08-30 NOTE — Progress Notes (Signed)
Pharmacy Antibiotic Follow-up Note  Jack Cantrell is a 82 y.o. year-old male admitted on 08/30/2015.  The patient is currently on day 1 of Vancomycin & Zosyn for rule out PNA. SNF patient, back to ED via EMS on 4/6 with confusion. Hx of N/V x 2 days, dx with PNA on ED visit 4/5 - prescribed Levaquin, sent back to SNF. CXray 4/5 negative, CXray 4/6 with bilateral opacities: edema vs infection  Assessment/Plan: Vancomycin 1gm x1, then 750mg   IV every q12 hours.  Goal trough 15-20 mcg/mL. Zosyn 3.375g IV q8h (4 hour infusion).  Temp (24hrs), Avg:97.6 F (36.4 C), Min:97.6 F (36.4 C), Max:97.6 F (36.4 C)   Recent Labs Lab 08/29/15 2025  WBC 17.4*    Recent Labs Lab 08/29/15 2025  CREATININE 0.76   Estimated Creatinine Clearance: 44.3 mL/min (by C-G formula based on Cr of 0.76).    No Known Allergies  Antimicrobials this admission: 4/6 Vancomycin  >>  4/6 Zosyn >>   Levels/dose changes this admission:  Microbiology results: 4/6 BCx: sent 4/6 UCx: sent  Thank you for allowing pharmacy to be a part of this patient's care.  Minda Ditto PharmD 08/30/2015 3:18 PM

## 2015-08-30 NOTE — Progress Notes (Signed)
Utilization Review completed.  Slate Debroux RN CM  

## 2015-08-31 ENCOUNTER — Inpatient Hospital Stay (HOSPITAL_COMMUNITY): Payer: Medicare Other

## 2015-08-31 ENCOUNTER — Encounter (HOSPITAL_COMMUNITY): Payer: Self-pay

## 2015-08-31 ENCOUNTER — Encounter (HOSPITAL_COMMUNITY)
Admission: EM | Disposition: A | Discharge status: Skilled Nursing Facility | Payer: Self-pay | Source: Home / Self Care | Attending: Internal Medicine

## 2015-08-31 ENCOUNTER — Inpatient Hospital Stay (HOSPITAL_COMMUNITY): Payer: Medicare Other | Admitting: Certified Registered"

## 2015-08-31 DIAGNOSIS — L8990 Pressure ulcer of unspecified site, unspecified stage: Secondary | ICD-10-CM | POA: Diagnosis present

## 2015-08-31 DIAGNOSIS — K224 Dyskinesia of esophagus: Secondary | ICD-10-CM

## 2015-08-31 DIAGNOSIS — N400 Enlarged prostate without lower urinary tract symptoms: Secondary | ICD-10-CM

## 2015-08-31 DIAGNOSIS — F0390 Unspecified dementia without behavioral disturbance: Secondary | ICD-10-CM

## 2015-08-31 DIAGNOSIS — A419 Sepsis, unspecified organism: Secondary | ICD-10-CM | POA: Diagnosis present

## 2015-08-31 HISTORY — DX: Unspecified dementia, unspecified severity, without behavioral disturbance, psychotic disturbance, mood disturbance, and anxiety: F03.90

## 2015-08-31 HISTORY — PX: ESOPHAGOGASTRODUODENOSCOPY (EGD) WITH PROPOFOL: SHX5813

## 2015-08-31 HISTORY — DX: Dyskinesia of esophagus: K22.4

## 2015-08-31 HISTORY — DX: Benign prostatic hyperplasia without lower urinary tract symptoms: N40.0

## 2015-08-31 LAB — URINE CULTURE: CULTURE: NO GROWTH

## 2015-08-31 LAB — STREP PNEUMONIAE URINARY ANTIGEN: Strep Pneumo Urinary Antigen: NEGATIVE

## 2015-08-31 SURGERY — ESOPHAGOGASTRODUODENOSCOPY (EGD) WITH PROPOFOL
Anesthesia: Monitor Anesthesia Care

## 2015-08-31 MED ORDER — PROPOFOL 10 MG/ML IV BOLUS
INTRAVENOUS | Status: AC
  Filled 2015-08-31: qty 20

## 2015-08-31 MED ORDER — PROPOFOL 10 MG/ML IV BOLUS
INTRAVENOUS | Status: DC | PRN
  Administered 2015-08-31: 20 mg via INTRAVENOUS
  Administered 2015-08-31: 30 mg via INTRAVENOUS
  Administered 2015-08-31: 80 mg via INTRAVENOUS
  Administered 2015-08-31: 30 mg via INTRAVENOUS

## 2015-08-31 MED ORDER — ONDANSETRON HCL 4 MG/2ML IJ SOLN
INTRAMUSCULAR | Status: DC | PRN
  Administered 2015-08-31: 4 mg via INTRAVENOUS

## 2015-08-31 MED ORDER — LIDOCAINE HCL (CARDIAC) 20 MG/ML IV SOLN
INTRAVENOUS | Status: AC
  Filled 2015-08-31: qty 5

## 2015-08-31 MED ORDER — VANCOMYCIN HCL IN DEXTROSE 1-5 GM/200ML-% IV SOLN: 1000.0000 mg | INTRAVENOUS | Status: DC

## 2015-08-31 MED ORDER — CETYLPYRIDINIUM CHLORIDE 0.05 % MT LIQD
7.0000 mL | Freq: Two times a day (BID) | OROMUCOSAL | Status: DC
  Administered 2015-08-31 – 2015-09-07 (×16): 7 mL via OROMUCOSAL

## 2015-08-31 MED ORDER — LIDOCAINE HCL (PF) 2 % IJ SOLN
INTRAMUSCULAR | Status: DC | PRN
  Administered 2015-08-31: 40 mg via INTRADERMAL

## 2015-08-31 MED ORDER — ONDANSETRON HCL 4 MG/2ML IJ SOLN
INTRAMUSCULAR | Status: AC
  Filled 2015-08-31: qty 2

## 2015-08-31 MED ORDER — AMPICILLIN-SULBACTAM SODIUM 3 (2-1) G IJ SOLR
3.0000 g | Freq: Four times a day (QID) | INTRAMUSCULAR | Status: DC
  Administered 2015-08-31 – 2015-09-07 (×29): 3 g via INTRAVENOUS
  Filled 2015-08-31 (×32): qty 3

## 2015-08-31 MED ORDER — SODIUM CHLORIDE 0.9 % IV SOLN: INTRAVENOUS | Status: DC

## 2015-08-31 SURGICAL SUPPLY — 14 items

## 2015-08-31 NOTE — Progress Notes (Signed)
Per [facility], patient will go to room 1512 with telemetry. Transfer order cancelled. Dr. Penelope Coop aware.

## 2015-08-31 NOTE — Progress Notes (Signed)
Patient in recovery in endoscopy after general anesthesia for EGD. Patient is responsive to voice, pulse ox 100% on simple mask, denies any pain. Order received from Dr. Penelope Coop to transfer patient to telemetry bed with continuous pulse oximetry. Dr. Conley Canal notified of transfer request, she agrees. Patient son Jack Cantrell notified of transfer.

## 2015-08-31 NOTE — Anesthesia Procedure Notes (Signed)
Procedure Name: Intubation Date/Time: 08/31/2015 2:44 PM Performed by: Lajuana Carry E Pre-anesthesia Checklist: Patient identified, Emergency Drugs available, Suction available and Patient being monitored Patient Re-evaluated:Patient Re-evaluated prior to inductionOxygen Delivery Method: Circle System Utilized Preoxygenation: Pre-oxygenation with 100% oxygen Intubation Type: IV induction and Cricoid Pressure applied Ventilation: Mask ventilation without difficulty Laryngoscope Size: Miller and 2 Grade View: Grade I Tube type: Oral Tube size: 7.5 mm Number of attempts: 1 Airway Equipment and Method: Stylet Placement Confirmation: ETT inserted through vocal cords under direct vision,  positive ETCO2 and breath sounds checked- equal and bilateral Secured at: 21 cm Tube secured with: Tape Dental Injury: Teeth and Oropharynx as per pre-operative assessment  Comments: Gentle mask ventilation prior to intubation, oral airway to posterior pharynx suctioned prior to mask ventilation

## 2015-08-31 NOTE — Progress Notes (Signed)
[facility] PROGRESS NOTE  Jack Cantrell [id number] DOB: 1914-08-21 DOA: 08/30/2015 PCP: Kirk Ruths., MD  Assessment/Plan:  Principal Problem:   Sepsis [facility]): per admitting MD. Does not appear septic at this time.  Active Problems:   Pneumonia, likely aspiration. From ILF, so NOT HCAP. Will narrow abx to unasyn only.  Dysphagia: Patient immediately regurgitates anything a swallows. Has been a problem in the past with history of nutcracker esophagus and also stricture per report. Last endoscopy was several years ago. Suspect stricture as he's been unable to swallow anything for at least 4 days. Will stop all by mouth medications and consult GI. Cancel speech therapy consult for now as it likely will not add anything. Patient and family willing to undergo endoscopy. He is 89, but otherwise quite independent. Would proceed with endoscopy if GI agrees. His gastroenterologist is Dr. Rivka Spring in [place] who performed EGD and dilatation. Will consult [facility] who is on-call for unassigned today. I don't think that speech therapy consult or esophagram would be terribly helpful. My suspicion for stricture is high, but will see what gastroenterology has to say. Discussed with Dr. Penelope Coop.    Pressure ulcer     BPH (benign prostatic hypertrophy)    Nutcracker esophagus/history of stricture per report.    hard of hearing: Patient wears hearing aids which assists and history gathering.  long discussion with patient who is lucid, and his son. They will like to be aggressive, but would not want resuscitation. Have changed CODE STATUS to DO NOT RESUSCITATE. He has a good quality of life and is fairly independent and a Management consultant".   Code Status:  DNR Family Communication:  son Disposition Plan:  From ILF. Will get PT eval to to see if higher level of care   Consultants:  [facility]  Procedures:     Antibiotics: Antibiotics Given (last 72 hours)    Date/Time Action Medication Dose  Rate   08/31/15 0029 Given   piperacillin-tazobactam (ZOSYN) IVPB 3.375 g 3.375 g 12.5 mL/hr   08/31/15 0520 Given   vancomycin (VANCOCIN) IVPB 750 mg/150 ml premix 750 mg 150 mL/hr   08/31/15 0752 Given   piperacillin-tazobactam (ZOSYN) IVPB 3.375 g 3.375 g 12.5 mL/hr      HPI/Subjective:  "I want another ice cold glass of water." Unable to swallow liquids or anything since Sunday. liquids "come right back up."   Objective: Filed Vitals:   08/30/15 1941 08/31/15 0529  BP: 159/147 148/74  Pulse: 98 82  Temp:  98.5 F (36.9 C)  Resp: 20 20   No intake or output data in the 24 hours ending 08/31/15 0856 Filed Weights   08/31/15 0100  Weight: 61.508 kg (135 lb 9.6 oz)    Exam:   General:   alert, oriented and appropriate.  HEENT: Dry mucous membranes. Oropharynx without obstruction. Hearing aids in place.   Cardiovascular:  regular rate rhythm without murmurs gallops rubs  Respiratory:  clear to auscultation bilaterally without wheezes rhonchi or rales   Abdomen:  soft nontender nondistended   Ext:  cyanosis or edema    psychiatric: Calm cooperative and appropriate. Normal affect.  Neurologic: Nonfocal.  Basic Metabolic Panel:  Recent Labs Lab 08/29/15 2025 08/30/15 1533  NA 139 140  K 3.7 4.0  CL 105 106  CO2 23 25  GLUCOSE 111* 133*  BUN 11 19  CREATININE 0.76 0.82  CALCIUM 9.3 9.7   Liver Function Tests:  Recent Labs Lab 08/29/15 2025 08/30/15 1533  AST 24 23  ALT 12* 13*  ALKPHOS 78 84  BILITOT 1.0 1.4*  PROT 6.4* 7.0  ALBUMIN 3.6 4.0    Recent Labs Lab 08/29/15 2035  LIPASE 23   No results for input(s): AMMONIA in the last 168 hours. CBC:  Recent Labs Lab 08/29/15 2025 08/30/15 1533  WBC 17.4* 20.1*  NEUTROABS 15.9* 18.8*  HGB 13.1 14.3  HCT 40.3 42.9  MCV 88.8 89.6  PLT 209 243   Cardiac Enzymes: No results for input(s): CKTOTAL, CKMB, CKMBINDEX, TROPONINI in the last 168 hours. BNP (last 3 results)  Recent Labs   08/30/15 1533  BNP 120.6*    ProBNP (last 3 results) No results for input(s): PROBNP in the last 8760 hours.  CBG: No results for input(s): GLUCAP in the last 168 hours.  Recent Results (from the past 240 hour(s))  Urine culture     Status: None (Preliminary result)   Collection Time: 08/30/15  5:24 PM  Result Value Ref Range Status   Specimen Description URINE, CLEAN CATCH  Final   Special Requests NONE  Final   Culture   Final    NO GROWTH < 12 HOURS Performed at [facility]    Report Status PENDING  Incomplete  MRSA PCR Screening     Status: None   Collection Time: 08/30/15  9:16 PM  Result Value Ref Range Status   MRSA by PCR NEGATIVE NEGATIVE Final    Comment:        The GeneXpert MRSA Assay (FDA approved for NASAL specimens only), is one component of a comprehensive MRSA colonization surveillance program. It is not intended to diagnose MRSA infection nor to guide or monitor treatment for MRSA infections.      Studies: Dg Chest 2 View  08/30/2015  CLINICAL DATA:  Nausea, vomiting and cough. EXAM: CHEST  2 VIEW COMPARISON:  None. FINDINGS: Normal heart size. Aortic atherosclerosis is identified. Decreased lung volumes with asymmetric elevation of the left hemidiaphragm. Diffuse opacities are identified in both lungs. Are identified bilaterally which may reflect multifocal pneumonia and or edema. IMPRESSION: 1. Diffuse bilateral pulmonary opacities compatible with edema versus multifocal infection. Electronically Signed   By: Kerby Moors M.D.   On: 08/30/2015 17:14   Dg Chest 2 View  08/29/2015  CLINICAL DATA:  Nausea and vomiting for 2-3 days EXAM: CHEST  2 VIEW COMPARISON:  None. FINDINGS: Cardiac shadow is at the upper limits of normal in size. Elevation of left hemidiaphragm is noted. No focal infiltrate or sizable effusion is seen. No acute bony abnormality is noted. IMPRESSION: No active cardiopulmonary disease. Electronically Signed   By: Inez Catalina  M.D.   On: 08/29/2015 21:56   Ct Head Wo Contrast  08/29/2015  CLINICAL DATA:  Confusion. EXAM: CT HEAD WITHOUT CONTRAST TECHNIQUE: Contiguous axial images were obtained from the base of the skull through the vertex without intravenous contrast. COMPARISON:  None. FINDINGS: Atrophy and periventricular small vessel disease is identified. There are likely some old lacunar infarcts as well in the deep white matter structures. No evidence of hemorrhage, acute infarction, edema, mass effect, extra-axial fluid collection, hydrocephalus or mass lesion. The skull is unremarkable. IMPRESSION: No acute findings. Atrophy, small vessel disease and old lacunar infarcts identified. Electronically Signed   By: Aletta Edouard M.D.   On: 08/29/2015 20:53    Scheduled Meds: . acidophilus  1 capsule Oral Daily  . allopurinol  300 mg Oral Daily  . antiseptic oral rinse  7  mL Mouth Rinse BID  . aspirin EC  81 mg Oral Daily  . donepezil  5 mg Oral QHS  . enoxaparin (LOVENOX) injection  40 mg Subcutaneous Q24H  . finasteride  5 mg Oral Daily  . guaiFENesin  600 mg Oral BID  . hyoscyamine  0.375 mg Oral BID  . loratadine  10 mg Oral Daily  . multivitamin with minerals  1 tablet Oral Daily  . ondansetron (ZOFRAN) IV  4 mg Intravenous 4 times per day  . pantoprazole (PROTONIX) IV  40 mg Intravenous Q12H  . piperacillin-tazobactam (ZOSYN)  IV  3.375 g Intravenous Q8H  . vancomycin  750 mg Intravenous Q12H   Continuous Infusions: . sodium chloride 75 mL/hr at 08/30/15 2209    Time spent: 35 minutes  [facility]  [email or web address], password [facility] 08/31/2015, 8:56 AM  LOS: 1 day

## 2015-08-31 NOTE — Consult Note (Signed)
Subjective:   HPI  The patient is a 82 year old male who has been experiencing problems with dysphagia for the past 5 days. He went out to eat on Sunday night and was eating chicken as well as other foods and seem to be fine. He was not having any problems swallowing prior to this recently. His son states that he saw him the following day and then after breakfast started to notice that he was vomiting up anything he swallowed and ate. According to the patient feels like foods not getting down into his stomach. He does have a history of esophageal problems in the past and has either had a stricture or some type of motility disorder. He states that he had his esophagus stretched in the past. He also states that he has been on hyoscyamine for his esophagus. In any event he is having ongoing problems with dysphagia. He denies heartburn.  Review of Systems Denies chest pain or shortness of breath  Past Medical History  Diagnosis Date  . Cancer ([place])   . Esophageal abnormality   . Gout    History reviewed. No pertinent past surgical history. Social History   Social History  . Marital Status: Single    Spouse Name: N/A  . Number of Children: N/A  . Years of Education: N/A   Occupational History  . Not on file.   Social History Main Topics  . Smoking status: Former Research scientist (life sciences)  . Smokeless tobacco: Not on file  . Alcohol Use: No  . Drug Use: Not on file  . Sexual Activity: Not on file   Other Topics Concern  . Not on file   Social History Narrative   family history is not on file.  Current facility-administered medications:  .  0.9 %  sodium chloride infusion, , Intravenous, Continuous, Delfina Redwood, MD, Last Rate: 100 mL/hr at 08/31/15 0950 .  Ampicillin-Sulbactam (UNASYN) 3 g in sodium chloride 0.9 % 100 mL IVPB, 3 g, Intravenous, Q6H, Delfina Redwood, MD, 3 g at 08/31/15 1041 .  antiseptic oral rinse (CPC / CETYLPYRIDINIUM CHLORIDE 0.05%) solution 7 mL, 7 mL, Mouth Rinse,  BID, Rondell A Smith, MD, 7 mL at 08/31/15 1000 .  fluticasone (FLONASE) 50 MCG/ACT nasal spray 1 spray, 1 spray, Each Nare, Daily PRN, Norval Morton, MD .  hyoscyamine (LEVBID) 0.375 MG 12 hr tablet 0.375 mg, 0.375 mg, Oral, BID, Norval Morton, MD, 0.375 mg at 08/30/15 2210 .  ipratropium (ATROVENT) nebulizer solution 0.5 mg, 0.5 mg, Nebulization, Q4H PRN, Norval Morton, MD .  levalbuterol (XOPENEX) nebulizer solution 0.63 mg, 0.63 mg, Nebulization, Q4H PRN, Norval Morton, MD .  ondansetron (ZOFRAN) injection 4 mg, 4 mg, Intravenous, 4 times per day, Norval Morton, MD, 4 mg at 08/31/15 1215 .  pantoprazole (PROTONIX) injection 40 mg, 40 mg, Intravenous, Q12H, Norval Morton, MD, 40 mg at 08/31/15 1041 No Known Allergies   Objective:     BP 148/74 mmHg  Pulse 82  Temp(Src) 98.5 F (36.9 C) (Oral)  Resp 20  Ht 5\' 3"  (1.6 m)  Wt 61.508 kg (135 lb 9.6 oz)  BMI 24.03 kg/m2  SpO2 95%  He is alert and oriented  No acute distress  Seems quite healthy for 82 years old  Heart regular rhythm grade 3/6 systolic murmur  Lungs clear  Abdomen: Bowel sounds normal, soft, nontender  Laboratory No components found for: D1    Assessment:     Dysphagia. The abrupt onset  of this makes me wonder whether or not he might have a food impaction in the esophagus. He wasn't having any problems swallowing recently before beginning of the week. He could also have an esophageal stricture.      Plan:     I discussed with patient and his son the options. We talked about doing a barium swallow. We talked about doing endoscopy. They are aware of the potential risks of endoscopy but would like to proceed with this not only for diagnostic but therapeutic benefit if something is found.

## 2015-08-31 NOTE — Anesthesia Postprocedure Evaluation (Signed)
Anesthesia Post Note  Patient: ARIAM LAMPER  Procedure(s) Performed: Procedure(s) (LRB): ESOPHAGOGASTRODUODENOSCOPY (EGD) WITH PROPOFOL (N/A)  Patient location during evaluation: PACU Anesthesia Type: General Level of consciousness: awake and alert and oriented Pain management: pain level controlled Vital Signs Assessment: post-procedure vital signs reviewed and stable Respiratory status: spontaneous breathing, nonlabored ventilation, respiratory function stable and patient connected to face mask oxygen Cardiovascular status: blood pressure returned to baseline and stable Postop Assessment: no signs of nausea or vomiting Anesthetic complications: no Comments: Case converted to GETA with RSI after esophagus found to be full of food. ETT suctioned with some particulate matter noted.     Last Vitals:  Filed Vitals:   08/31/15 1540 08/31/15 1550  BP: 177/76 181/78  Pulse: 79 78  Temp:    Resp: 24 24    Last Pain: There were no vitals filed for this visit.               Syana Degraffenreid A.

## 2015-08-31 NOTE — Consult Note (Signed)
WOC wound consult note Reason for Consult: sacral pressure ulcer Wound type: Stage 1 Pressure INjury coccyx Noted to have skin tear to the right arm upon my arrival to the patient's room  Pressure Ulcer POA: Yes Measurement: 2cm x 2cm x 0 on coccyx non blanchable.  Skin tear aprox. 0.7cm x 5cm x 0.2cm  Wound bed: Coccyx: pink, skin intact Skin tear right arm: unable to completely reaproximate the skin flap (happened Sunday), exposed wound bed is clean, pink, a bit dry Drainage (amount, consistency, odor) none either site, no dressing on the right arm at the time of my assessment Periwound: intact, some bruising on the right arm Dressing procedure/placement/frequency: Add Vaseline gauze for the skin tear, cover and change every 3 days. NO TAPE.  Silicone foam dressing for the sacrum and coccyx wound.  Change both every 3 days.  Patient turns well, and since he only has one affected turning surface will DC order for air mattress at this time.  May need chair pressure redistribution pad for his chair at home, however after discussion with son will wait until after his GI workup to order.  Nada Godley [place] RN,CWOCN [id number]

## 2015-08-31 NOTE — Transfer of Care (Signed)
Immediate Anesthesia Transfer of Care Note  Patient: Jack Cantrell  Procedure(s) Performed: Procedure(s): ESOPHAGOGASTRODUODENOSCOPY (EGD) WITH PROPOFOL (N/A)  Patient Location: PACU  Anesthesia Type:General  Level of Consciousness:  sedated, patient cooperative and responds to stimulation  Airway & Oxygen Therapy:Patient Spontanous Breathing and Patient connected to face mask oxgen  Post-op Assessment:  Report given to PACU RN and Post -op Vital signs reviewed and stable  Post vital signs:  Reviewed and stable  Last Vitals:  Filed Vitals:   08/31/15 1355 08/31/15 1356  BP:    Pulse: 75   Temp:  37.1 C  Resp: 25     Complications: No apparent anesthesia complications

## 2015-08-31 NOTE — Progress Notes (Signed)
Pharmacy Antibiotic Follow-up Note  Jack Cantrell is a 82 y.o. year-old male admitted on 08/30/2015.  The patient is currently on day 1 of Vancomycin & Zosyn for rule out PNA. SNF patient, back to ED via EMS on 4/6 with confusion. Hx of N/V x 2 days, dx with PNA on ED visit 4/5 - prescribed Levaquin, sent back to SNF. CXray 4/5 negative, CXray 4/6 with bilateral opacities: edema vs infection  Assessment/Plan: Adjust vancomycin to 1g IV q24h, goal trough 15-20 mcg/ml Continue Zosyn 3.375gm IV q8h (4hr extended infusions) Follow up renal function & cultures De-escalate as appropriate  Temp (24hrs), Avg:98.5 F (36.9 C), Min:98.5 F (36.9 C), Max:98.5 F (36.9 C)   Recent Labs Lab 08/29/15 2025 08/30/15 1533  WBC 17.4* 20.1*     Recent Labs Lab 08/29/15 2025 08/30/15 1533  CREATININE 0.76 0.82   Estimated Creatinine Clearance: 39.5 mL/min (by C-G formula based on Cr of 0.82).    No Known Allergies  Antimicrobials this admission: 4/6 Vancomycin  >>  4/6 Zosyn >>   Levels/dose changes this admission: 4/7: adjust vancomycin from 750mg  IV q12h to 1g IV q24h  Microbiology results: 4/6 BCx: sent 4/6 UCx: sent 4/6 MRSA PCR (-) 4/7 Urine strep (-)  Thank you for allowing pharmacy to be a part of this patient's care.  Peggyann Juba, PharmD, BCPS Pager: [phone] 08/31/2015 8:56 AM

## 2015-08-31 NOTE — Anesthesia Preprocedure Evaluation (Addendum)
Anesthesia Evaluation  Patient identified by MRN, date of birth, ID band Patient awake    Reviewed: Allergy & Precautions, NPO status , Patient's Chart, lab work & pertinent test results  Airway Mallampati: III  TM Distance: >3 FB Neck ROM: Full    Dental  (+) Lower Dentures, Upper Dentures, Edentulous Upper, Edentulous Lower   Pulmonary pneumonia, resolved, former smoker,    Pulmonary exam normal breath sounds clear to auscultation       Cardiovascular negative cardio ROS Normal cardiovascular exam Rhythm:Regular Rate:Normal     Neuro/Psych negative neurological ROS  negative psych ROS   GI/Hepatic Neg liver ROS, Esophageal FB vs. Stricture Dysphagia Nutcracker esophagus   Endo/Other  Hyperglycemia  Renal/GU negative Renal ROS  negative genitourinary   Musculoskeletal negative musculoskeletal ROS (+)   Abdominal   Peds  Hematology sepsis   Anesthesia Other Findings   Reproductive/Obstetrics                            Anesthesia Physical Anesthesia Plan  ASA: III  Anesthesia Plan: MAC   Post-op Pain Management:    Induction: Intravenous  Airway Management Planned: Natural Airway and Nasal Cannula  Additional Equipment:   Intra-op Plan:   Post-operative Plan:   Informed Consent: I have reviewed the patients History and Physical, chart, labs and discussed the procedure including the risks, benefits and alternatives for the proposed anesthesia with the patient or authorized representative who has indicated his/her understanding and acceptance.     Plan Discussed with: CRNA, Anesthesiologist and Surgeon  Anesthesia Plan Comments:         Anesthesia Quick Evaluation

## 2015-08-31 NOTE — Op Note (Signed)
[facility] Patient Name: Jack Cantrell Procedure Date: 08/31/2015 MRN: [id number] Attending MD: Wonda Horner , MD Date of Birth: 08/02/1914 CSN:  Age: 82 Admit Type: Inpatient Procedure:                Upper GI endoscopy Indications:              Dysphagia Providers:                Wonda Horner, MD, Laverta Baltimore, RN, William Dalton, Technician Referring MD:              Medicines:                General Anesthesia, patient was intubated to                            protect airway Complications:            No immediate complications. Estimated Blood Loss:     Estimated blood loss: none. Procedure:                Pre-Anesthesia Assessment:                           - Prior to the procedure, a History and Physical                            was performed, and patient medications and                            allergies were reviewed. The patient's tolerance of                            previous anesthesia was also reviewed. The risks                            and benefits of the procedure and the sedation                            options and risks were discussed with the patient.                            All questions were answered, and informed consent                            was obtained. Prior Anticoagulants: The patient has                            taken no previous anticoagulant or antiplatelet                            agents. ASA Grade Assessment: II - A patient with                            mild systemic  disease. After reviewing the risks                            and benefits, the patient was deemed in                            satisfactory condition to undergo the procedure.                           After obtaining informed consent, the endoscope was                            passed under direct vision. Throughout the                            procedure, the patient's blood pressure, pulse, and                 oxygen saturations were monitored continuously. The                            [id number]) scope was introduced through the                            mouth, and advanced to the fundus of the stomach.                            The upper GI endoscopy was performed with                            difficulty due to presence of food. The patient                            tolerated the procedure well. Scope In: Scope Out: Findings:      Food was found in the entire esophagus. Medications were found in the       esophagus. Removal of food was accomplished. There was still some       liquidy food in the esophgus but it should pass.      Patchy erythema was found in the entire esophagus.      One moderate stenosis was found. This measured 1 cm (inner diameter) and       was traversed. Friable at this time and not dilated. Impression:               - Food in the esophagus. Removal was successful.                           - Erythema in the esophagus.                           - Esophageal stenosis. Moderate Sedation:      . Recommendation:           - Full liquid diet.                           - Continue present medications. Procedure Code(s):        ---  Professional ---                           [phone], Esophagoscopy, flexible, transoral; with                            removal of foreign body(s) Diagnosis Code(s):        --- Professional ---                           [id number], Food in esophagus causing other injury,                            initial encounter                           K22.8, Other specified diseases of esophagus                           K22.2, Esophageal obstruction                           R13.10, Dysphagia, unspecified CPT copyright 2016 American [employer] Association. All rights reserved. The codes documented in this report are preliminary and upon coder review may  be revised to meet current compliance requirements. Anson Fret, MD Wonda Horner, MD 08/31/2015 3:24:12 PM This report has been signed electronically. Number of Addenda: 0

## 2015-08-31 NOTE — Progress Notes (Signed)
Patient was able to tolerate PO medications however he does "spit up" water after drinking it. Some coughing was noted. No pills were spit up. MD made patient NPO shortly after med administration. Pt made aware.

## 2015-09-01 LAB — CBC
HEMATOCRIT: 33.9 % — AB (ref 39.0–52.0)
HEMOGLOBIN: 10.9 g/dL — AB (ref 13.0–17.0)
MCH: 29.7 pg (ref 26.0–34.0)
MCHC: 32.2 g/dL (ref 30.0–36.0)
MCV: 92.4 fL (ref 78.0–100.0)
Platelets: 177 10*3/uL (ref 150–400)
RBC: 3.67 MIL/uL — AB (ref 4.22–5.81)
RDW: 14.6 % (ref 11.5–15.5)
WBC: 9.4 10*3/uL (ref 4.0–10.5)

## 2015-09-01 LAB — BASIC METABOLIC PANEL
ANION GAP: 8 (ref 5–15)
BUN: 19 mg/dL (ref 6–20)
CHLORIDE: 113 mmol/L — AB (ref 101–111)
CO2: 22 mmol/L (ref 22–32)
Calcium: 8.8 mg/dL — ABNORMAL LOW (ref 8.9–10.3)
Creatinine, Ser: 0.72 mg/dL (ref 0.61–1.24)
GLUCOSE: 74 mg/dL (ref 65–99)
POTASSIUM: 3.2 mmol/L — AB (ref 3.5–5.1)
Sodium: 143 mmol/L (ref 135–145)

## 2015-09-01 LAB — LACTIC ACID, PLASMA: Lactic Acid, Venous: 0.8 mmol/L (ref 0.5–2.0)

## 2015-09-01 NOTE — Progress Notes (Signed)
PROGRESS NOTE  Jack Cantrell [id number] DOB: [birth year] DOA: 08/30/2015 PCP: Kirk Ruths., MD Outpatient Specialists:    LOS: 2 days   Brief Narrative: 82 year old male with a past medical history of a hiatal hernia, gout, nutcracker esophagus; who presents from the [facility] living facility with a 4 day history of coughing. Appearing to be dehydrated and confused.   Assessment & Plan: Principal Problem:   Sepsis ([place]) Active Problems:   Pneumonia   Pressure ulcer   BPH (benign prostatic hypertrophy)   Nutcracker esophagus  Dysphagia - This problem has been chronic, history of nutcracker esophagus and strictures. Last endoscopy several years ago. - Gastroenterology was consulted, and patient underwent an EGD yesterday which showed food stuck in the esophagus status post removal, with local irritation and erythema as well as esophageal stenosis which was not dilated due to friable appearance. I discussed with Dr. Oletta Lamas today, we'll continue clear liquid diet for now and consider repeat endoscopy early next week for dilatation / and a barium swallow - continue zofran and protonix  Sepsis likely due to aspiration pneumonia  - On admission, sepsis physiology resolved, continue Unasyn  BPH (benign prostatic hypertrophy)  HOH  Dementia - continue Aricept  DVT prophylaxis: SCDs Code Status: DO NOT RESUSCITATE Family Communication: Discussed with son Nadara Mustard over the phone Disposition Plan: ILF when ready Barriers for discharge: GI workup  Consultants:   Gastroenterology  Procedures:   EGD 4/7  Antimicrobials:  Unasyn   Subjective: - No complaints this morning, he is confused, denies any nausea or vomiting. No chest pain or shortness of breath  Objective: Filed Vitals:   08/31/15 1640 08/31/15 1654 08/31/15 2100 09/01/15 0442  BP: 188/76 171/70 151/69 149/70  Pulse: 76 74 68 68  Temp:   98.2 F (36.8 C) 98.9 F (37.2 C)  TempSrc:   Oral  Oral  Resp: 22 22 20 19   Height:      Weight:      SpO2: 98% 100% 100% 99%    Intake/Output Summary (Last 24 hours) at 09/01/15 1257 Last data filed at 09/01/15 0917  Gross per 24 hour  Intake    520 ml  Output    100 ml  Net    420 ml   Filed Weights   08/31/15 0100  Weight: 61.508 kg (135 lb 9.6 oz)   Examination: Constitutional: NAD, calm, comfortable, confused Filed Vitals:   08/31/15 1640 08/31/15 1654 08/31/15 2100 09/01/15 0442  BP: 188/76 171/70 151/69 149/70  Pulse: 76 74 68 68  Temp:   98.2 F (36.8 C) 98.9 F (37.2 C)  TempSrc:   Oral Oral  Resp: 22 22 20 19   Height:      Weight:      SpO2: 98% 100% 100% 99%  Eyes: PERRL ENMT: Mucous membranes are moist. Respiratory: clear to auscultation bilaterally, no wheezing, no crackles.  Cardiovascular: Regular rate and rhythm, no murmurs / rubs / gallops.  Abdomen: no tenderness, no masses palpated. Bowel sounds positive.  Musculoskeletal: no clubbing / cyanosis.  Neurologic: non focal  Psychiatric: AxO to person   Data Reviewed: I have personally reviewed following labs and imaging studies  CBC:  Recent Labs Lab 08/29/15 2025 08/30/15 1533 09/01/15 0557  WBC 17.4* 20.1* 9.4  NEUTROABS 15.9* 18.8*  --   HGB 13.1 14.3 10.9*  HCT 40.3 42.9 33.9*  MCV 88.8 89.6 92.4  PLT 209 243 [id number]   Basic Metabolic Panel:  Recent Labs Lab 08/29/15 2025 08/30/15  1533 09/01/15 0557  NA 139 140 143  K 3.7 4.0 3.2*  CL 105 106 113*  CO2 23 25 22   GLUCOSE 111* 133* 74  BUN 11 19 19   CREATININE 0.76 0.82 0.72  CALCIUM 9.3 9.7 8.8*   GFR: Estimated Creatinine Clearance: 40.5 mL/min (by C-G formula based on Cr of 0.72). Liver Function Tests:  Recent Labs Lab 08/29/15 2025 08/30/15 1533  AST 24 23  ALT 12* 13*  ALKPHOS 78 84  BILITOT 1.0 1.4*  PROT 6.4* 7.0  ALBUMIN 3.6 4.0    Recent Labs Lab 08/29/15 2035  LIPASE 23   No results for input(s): AMMONIA in the last 168 hours. Coagulation  Profile: No results for input(s): INR, PROTIME in the last 168 hours. Cardiac Enzymes: No results for input(s): CKTOTAL, CKMB, CKMBINDEX, TROPONINI in the last 168 hours. BNP (last 3 results) No results for input(s): PROBNP in the last 8760 hours. HbA1C: No results for input(s): HGBA1C in the last 72 hours. CBG: No results for input(s): GLUCAP in the last 168 hours. Lipid Profile: No results for input(s): CHOL, HDL, LDLCALC, TRIG, CHOLHDL, LDLDIRECT in the last 72 hours. Thyroid Function Tests: No results for input(s): TSH, T4TOTAL, FREET4, T3FREE, THYROIDAB in the last 72 hours. Anemia Panel: No results for input(s): VITAMINB12, FOLATE, FERRITIN, TIBC, IRON, RETICCTPCT in the last 72 hours. Urine analysis:    Component Value Date/Time   COLORURINE AMBER* 08/30/2015 [place] 08/30/2015 1724   LABSPEC 1.026 08/30/2015 1724   PHURINE 6.0 08/30/2015 1724   GLUCOSEU NEGATIVE 08/30/2015 1724   HGBUR NEGATIVE 08/30/2015 1724   BILIRUBINUR NEGATIVE 08/30/2015 1724   [place] 08/30/2015 1724   PROTEINUR 30* 08/30/2015 1724   NITRITE NEGATIVE 08/30/2015 1724   LEUKOCYTESUR NEGATIVE 08/30/2015 1724   Sepsis Labs: Invalid input(s): PROCALCITONIN, LACTICIDVEN  Recent Results (from the past 240 hour(s))  Blood Culture (routine x 2)     Status: None (Preliminary result)   Collection Time: 08/30/15  3:37 PM  Result Value Ref Range Status   Specimen Description BLOOD LEFT ANTECUBITAL  Final   Special Requests BOTTLES DRAWN AEROBIC AND ANAEROBIC 5CC  Final   Culture   Final    NO GROWTH 2 DAYS Performed at [facility]    Report Status PENDING  Incomplete  Blood Culture (routine x 2)     Status: None (Preliminary result)   Collection Time: 08/30/15  3:38 PM  Result Value Ref Range Status   Specimen Description BLOOD RIGHT ANTECUBITAL  Final   Special Requests BOTTLES DRAWN AEROBIC AND ANAEROBIC 5CC  Final   Culture   Final    NO GROWTH 2  DAYS Performed at [facility]    Report Status PENDING  Incomplete  Urine culture     Status: None   Collection Time: 08/30/15  5:24 PM  Result Value Ref Range Status   Specimen Description URINE, CLEAN CATCH  Final   Special Requests NONE  Final   Culture   Final    NO GROWTH 1 DAY Performed at [facility]    Report Status 08/31/2015 FINAL  Final  MRSA PCR Screening     Status: None   Collection Time: 08/30/15  9:16 PM  Result Value Ref Range Status   MRSA by PCR NEGATIVE NEGATIVE Final    Comment:        The GeneXpert MRSA Assay (FDA approved for NASAL specimens only), is one component of a comprehensive MRSA  colonization surveillance program. It is not intended to diagnose MRSA infection nor to guide or monitor treatment for MRSA infections.     Radiology Studies: Dg Chest 2 View  08/30/2015  CLINICAL DATA:  Nausea, vomiting and cough. EXAM: CHEST  2 VIEW COMPARISON:  None. FINDINGS: Normal heart size. Aortic atherosclerosis is identified. Decreased lung volumes with asymmetric elevation of the left hemidiaphragm. Diffuse opacities are identified in both lungs. Are identified bilaterally which may reflect multifocal pneumonia and or edema. IMPRESSION: 1. Diffuse bilateral pulmonary opacities compatible with edema versus multifocal infection. Electronically Signed   By: Kerby Moors M.D.   On: 08/30/2015 17:14   Dg Chest Port 1 View  08/31/2015  CLINICAL DATA:  82 year old male status post endoscopy with food impaction in esophagus discovered. Query aspiration. Initial encounter. EXAM: PORTABLE CHEST 1 VIEW COMPARISON:  08/30/2015. FINDINGS: Portable AP semi upright view at 1541 hours. Continued moderate elevation of the left hemidiaphragm and patchy opacity at the left lung base. No confluent opacity in the right lung. Stable cardiomegaly and mediastinal contours. No pneumothorax. No definite effusion. IMPRESSION: Continued moderate elevation of the left  hemidiaphragm with nonspecific left lung base opacity which could reflect some left lung aspiration. No definite acute findings on the right. Electronically Signed   By: Genevie Ann M.D.   On: 08/31/2015 15:56   Scheduled Meds: . ampicillin-sulbactam (UNASYN) IV  3 g Intravenous Q6H  . antiseptic oral rinse  7 mL Mouth Rinse BID  . hyoscyamine  0.375 mg Oral BID  . ondansetron (ZOFRAN) IV  4 mg Intravenous 4 times per day  . pantoprazole (PROTONIX) IV  40 mg Intravenous Q12H   Continuous Infusions: . sodium chloride 100 mL/hr at 09/01/15 0317   Time spent: 25 minutes, > 50% bedside and discussing with son  Marzetta Board, MD, PhD [facility] Pager [phone]  If 7PM-7AM, please contact night-coverage [email or web address] Password [facility] 09/01/2015, 12:57 PM

## 2015-09-01 NOTE — Evaluation (Signed)
Physical Therapy Evaluation Patient Details Name: XAIVIER BELDIN MRN: [id number] DOB: [birth year] Today's Date: 09/01/2015   History of Present Illness  Pt admitted with coughing, dehydration and altered mental status. Found to have esophageal impaction and is now on liquid only diet   Clinical Impression  Mr. Ekleberry has been doing a different technique for his pivot transfer at home than the way we tried to do it today and he had difficulty adjusting to the IV, telemetry, oxygen and catheter tubes.  I recommend that nursing use the [facility] for bed to chair/BSC transfers while he is here and home health PT visit him in his environment to work on his functional independence at home.  We will see him here for strengthening and sit to stand work.    Follow Up Recommendations Home health PT    Equipment Recommendations  None recommended by PT    Recommendations for Other Services       Precautions / Restrictions Precautions Precautions: Fall Restrictions Weight Bearing Restrictions: No      Mobility  Bed Mobility Overal bed mobility: +2 for physical assistance             General bed mobility comments: needs assist to bring shoulders back and bring legs up onto bed   Transfers Overall transfer level: Needs assistance   Transfers: Squat Pivot Transfers     Squat pivot transfers: +2 physical assistance     General transfer comment: pt to bedside commode for BM, then back to bed via [place].  He is able to stand, but is not able to move feet to complete pivot to chair  Ambulation/Gait                Stairs            Wheelchair Mobility    Modified Rankin (Stroke Patients Only)       Balance Overall balance assessment: Modified Independent (maintains kyphotic posture)                                           Pertinent Vitals/Pain Pain Assessment: No/denies pain    Home Living Family/patient expects to be discharged to:: Assisted  living                 Additional Comments: lives at assisted living     Prior Function Level of Independence: Needs assistance   Gait / Transfers Assistance Needed: pt has only been doing stand pivot only . he has not ambulated            Hand Dominance        Extremity/Trunk Assessment   Upper Extremity Assessment: Overall WFL for tasks assessed           Lower Extremity Assessment: Generalized weakness      Cervical / Trunk Assessment: Kyphotic (severe, unable to stand up straight )  Communication   Communication: No difficulties  Cognition Arousal/Alertness: Awake/alert Behavior During Therapy: Anxious (pt wanted to do things his way, bothered by lines and tubes) Overall Cognitive Status: Within Functional Limits for tasks assessed (Pt not able to adjust to transfers in different setting )                      General Comments      Exercises        Assessment/Plan  PT Assessment Patient needs continued PT services  PT Diagnosis Generalized weakness   PT Problem List Decreased strength;Decreased range of motion;Decreased activity tolerance;Decreased balance;Decreased mobility;Decreased knowledge of use of DME;Decreased safety awareness  PT Treatment Interventions DME instruction;Functional mobility training;Therapeutic activities;Therapeutic exercise   PT Goals (Current goals can be found in the Care Plan section) Acute Rehab PT Goals Patient Stated Goal: to return to his assisted living PT Goal Formulation: With patient Time For Goal Achievement: 09/15/15 Potential to Achieve Goals: Good    Frequency Min 3X/week   Barriers to discharge        Co-evaluation               End of Session Equipment Utilized During Treatment: Oxygen Activity Tolerance: Patient tolerated treatment well Patient left: in bed;with family/visitor present Nurse Communication: Mobility status;Need for lift equipment         Time:  [id number] PT Time Calculation (min) (ACUTE ONLY): 34 min   Charges:   PT Evaluation $PT Eval Low Complexity: 1 Procedure     PT G Codes:       Jamicia Haaland K. Owens Shark, PT  Norwood Levo 09/01/2015, 5:42 PM

## 2015-09-01 NOTE — Progress Notes (Signed)
[facility] PROGRESS NOTE Subjective patient had EGD with an esophagus full food that was this impacted and removed by Dr. Penelope Coop yesterday. Did have slight stricture. Due to the difficulty removing all the food material marked irritation to the esophagus no dilation has yet been done.  Objective: Vital signs in last 24 hours: Temp:  [97.6 F (36.4 C)-98.9 F (37.2 C)] 98.9 F (37.2 C) (04/08 0442) Pulse Rate:  [68-80] 68 (04/08 0442) Resp:  [19-25] 19 (04/08 0442) BP: (138-194)/(61-81) 149/70 mmHg (04/08 0442) SpO2:  [93 %-100 %] 99 % (04/08 0442) Last BM Date: 08/29/15  Intake/Output from previous day: 04/07 0701 - 04/08 0700 In: 400 [I.V.:400] Out: 300 [Urine:300] Intake/Output this shift:    PE: General-- alert and oriented somewhat hard of hearing  Lungs-- grossly normal Abdomen-- soft and nontender  Lab Results:  Recent Labs  08/29/15 2025 08/30/15 1533 09/01/15 0557  WBC 17.4* 20.1* 9.4  HGB 13.1 14.3 10.9*  HCT 40.3 42.9 33.9*  PLT 209 243 177   BMET  Recent Labs  08/29/15 2025 08/30/15 1533 09/01/15 0557  NA 139 140 143  K 3.7 4.0 3.2*  CL 105 106 113*  CO2 23 25 22   CREATININE 0.76 0.82 0.72   LFT  Recent Labs  08/29/15 2025 08/30/15 1533  PROT 6.4* 7.0  AST 24 23  ALT 12* 13*  ALKPHOS 78 84  BILITOT 1.0 1.4*   PT/INR No results for input(s): LABPROT, INR in the last 72 hours. PANCREAS  Recent Labs  08/29/15 2035  LIPASE 23         Studies/Results: Dg Chest 2 View  08/30/2015  CLINICAL DATA:  Nausea, vomiting and cough. EXAM: CHEST  2 VIEW COMPARISON:  None. FINDINGS: Normal heart size. Aortic atherosclerosis is identified. Decreased lung volumes with asymmetric elevation of the left hemidiaphragm. Diffuse opacities are identified in both lungs. Are identified bilaterally which may reflect multifocal pneumonia and or edema. IMPRESSION: 1. Diffuse bilateral pulmonary opacities compatible with edema versus multifocal  infection. Electronically Signed   By: Kerby Moors M.D.   On: 08/30/2015 17:14   Dg Chest Port 1 View  08/31/2015  CLINICAL DATA:  82 year old male status post endoscopy with food impaction in esophagus discovered. Query aspiration. Initial encounter. EXAM: PORTABLE CHEST 1 VIEW COMPARISON:  08/30/2015. FINDINGS: Portable AP semi upright view at 1541 hours. Continued moderate elevation of the left hemidiaphragm and patchy opacity at the left lung base. No confluent opacity in the right lung. Stable cardiomegaly and mediastinal contours. No pneumothorax. No definite effusion. IMPRESSION: Continued moderate elevation of the left hemidiaphragm with nonspecific left lung base opacity which could reflect some left lung aspiration. No definite acute findings on the right. Electronically Signed   By: Genevie Ann M.D.   On: 08/31/2015 15:56    Medications: I have reviewed the patient's current medications.  Assessment/Plan: 1. Food material in the esophagus. Question is this due to dysmotility or to the minimal stricture seen. The stricture was not very impressive but if he does have some stricture in some dysmotility due to aging that may be enough to cause [place] problems. At this point, we will resume a liquid diet have him eat while sitting up and likely start with a barium swallow to evaluate the stricture and evaluate for motility.   Allura Doepke JR,Uziel Covault L 09/01/2015, 7:36 AM  This note was created using voice recognition software. Minor errors may Have occurred unintentionally.  Pager: [phone] If no answer or after hours  call [phone]

## 2015-09-02 ENCOUNTER — Inpatient Hospital Stay (HOSPITAL_COMMUNITY): Payer: Medicare Other

## 2015-09-02 LAB — COMPREHENSIVE METABOLIC PANEL
ALT: 11 U/L — AB (ref 17–63)
AST: 17 U/L (ref 15–41)
Albumin: 3.1 g/dL — ABNORMAL LOW (ref 3.5–5.0)
Alkaline Phosphatase: 60 U/L (ref 38–126)
Anion gap: 6 (ref 5–15)
BILIRUBIN TOTAL: 0.9 mg/dL (ref 0.3–1.2)
BUN: 12 mg/dL (ref 6–20)
CHLORIDE: 109 mmol/L (ref 101–111)
CO2: 28 mmol/L (ref 22–32)
CREATININE: 0.73 mg/dL (ref 0.61–1.24)
Calcium: 8.9 mg/dL (ref 8.9–10.3)
Glucose, Bld: 118 mg/dL — ABNORMAL HIGH (ref 65–99)
Potassium: 3 mmol/L — ABNORMAL LOW (ref 3.5–5.1)
Sodium: 143 mmol/L (ref 135–145)
TOTAL PROTEIN: 5.5 g/dL — AB (ref 6.5–8.1)

## 2015-09-02 LAB — CBC
HCT: 33.1 % — ABNORMAL LOW (ref 39.0–52.0)
Hemoglobin: 11.1 g/dL — ABNORMAL LOW (ref 13.0–17.0)
MCH: 29.8 pg (ref 26.0–34.0)
MCHC: 33.5 g/dL (ref 30.0–36.0)
MCV: 89 fL (ref 78.0–100.0)
PLATELETS: 172 10*3/uL (ref 150–400)
RBC: 3.72 MIL/uL — ABNORMAL LOW (ref 4.22–5.81)
RDW: 14 % (ref 11.5–15.5)
WBC: 11.7 10*3/uL — AB (ref 4.0–10.5)

## 2015-09-02 MED ORDER — ALLOPURINOL 300 MG PO TABS
300.0000 mg | ORAL_TABLET | Freq: Every day | ORAL | Status: DC
  Administered 2015-09-02 – 2015-09-07 (×6): 300 mg via ORAL
  Filled 2015-09-02 (×6): qty 1

## 2015-09-02 MED ORDER — COLCHICINE 0.6 MG PO TABS
0.6000 mg | ORAL_TABLET | Freq: Every day | ORAL | Status: DC
  Administered 2015-09-02 – 2015-09-07 (×6): 0.6 mg via ORAL
  Filled 2015-09-02 (×6): qty 1

## 2015-09-02 NOTE — Progress Notes (Signed)
PROGRESS NOTE  Jack Cantrell [id number] DOB: [birth year] DOA: 08/30/2015 PCP: Jack Ruths., MD Outpatient Specialists:    LOS: 3 days   Brief Narrative: 82 year old male with a past medical history of a hiatal hernia, gout, nutcracker esophagus; who presents from the [facility] living facility with a 4 day history of coughing. Appearing to be dehydrated and confused.   Assessment & Plan: Principal Problem:   Sepsis ([place]) Active Problems:   Pneumonia   Pressure ulcer   BPH (benign prostatic hypertrophy)   Nutcracker esophagus  Dysphagia - This problem has been chronic, history of nutcracker esophagus and strictures. Last endoscopy several years ago. - Gastroenterology was consulted, and patient underwent an EGD 4/7 which showed food stuck in the esophagus status post removal, with local irritation and erythema as well as esophageal stenosis which was not dilated due to friable appearance. I discussed with Dr. Oletta Cantrell 4/8, we'll continue clear liquid diet for now and consider repeat endoscopy early next week for dilatation and / or a barium swallow - continue zofran and protonix  Sepsis likely due to aspiration pneumonia  - On admission, sepsis physiology resolved, continue Unasyn  BPH (benign prostatic hypertrophy)  HOH  Dementia - continue Aricept  Knee swelling - chronic, bilateral, patient endorses history of gout, resumed his home allopurinol, give colchicine for few days - XR negative for fractures, has swelling   DVT prophylaxis: SCDs Code Status: DO NOT RESUSCITATE Family Communication: no family at bedside Disposition Plan: ILF when ready Barriers for discharge: GI workup  Consultants:   Gastroenterology  Procedures:   EGD 4/7  Antimicrobials:  Unasyn   Subjective: - complains of left knee pain  Objective: Filed Vitals:   09/01/15 0442 09/01/15 2116 09/01/15 2304 09/02/15 0628  BP: 149/70 167/66 149/68 158/60  Pulse: 68 81 87  72  Temp: 98.9 F (37.2 C) 98.4 F (36.9 C) 99.5 F (37.5 C) 97.6 F (36.4 C)  TempSrc: Oral Oral Axillary Axillary  Resp: 19 18  16   Height:      Weight:      SpO2: 99% 100% 98% 99%    Intake/Output Summary (Last 24 hours) at 09/02/15 1321 Last data filed at 09/02/15 1100  Gross per 24 hour  Intake   2387 ml  Output      0 ml  Net   2387 ml   Filed Weights   08/31/15 0100  Weight: 61.508 kg (135 lb 9.6 oz)   Examination: Constitutional: NAD, calm, comfortable, confused Filed Vitals:   09/01/15 0442 09/01/15 2116 09/01/15 2304 09/02/15 0628  BP: 149/70 167/66 149/68 158/60  Pulse: 68 81 87 72  Temp: 98.9 F (37.2 C) 98.4 F (36.9 C) 99.5 F (37.5 C) 97.6 F (36.4 C)  TempSrc: Oral Oral Axillary Axillary  Resp: 19 18  16   Height:      Weight:      SpO2: 99% 100% 98% 99%  Eyes: PERRL ENMT: Mucous membranes are moist. Respiratory: clear to auscultation bilaterally, no wheezing, no crackles.  Cardiovascular: Regular rate and rhythm, no murmurs / rubs / gallops.  Abdomen: no tenderness, no masses palpated. Bowel sounds positive.  Musculoskeletal: no clubbing / cyanosis. Bilateral knee swelling, L > R, no erythema, minimally tender to palpation Neurologic: non focal  Psychiatric: AxO to person   Data Reviewed: I have personally reviewed following labs and imaging studies  CBC:  Recent Labs Lab 08/29/15 2025 08/30/15 1533 09/01/15 0557 09/02/15 0541  WBC 17.4* 20.1* 9.4 11.7*  NEUTROABS 15.9* 18.8*  --   --   HGB 13.1 14.3 10.9* 11.1*  HCT 40.3 42.9 33.9* 33.1*  MCV 88.8 89.6 92.4 89.0  PLT 209 243 177 [id number]   Basic Metabolic Panel:  Recent Labs Lab 08/29/15 2025 08/30/15 1533 09/01/15 0557 09/02/15 0541  NA 139 140 143 143  K 3.7 4.0 3.2* 3.0*  CL 105 106 113* 109  CO2 23 25 22 28   GLUCOSE 111* 133* 74 118*  BUN 11 19 19 12   CREATININE 0.76 0.82 0.72 0.73  CALCIUM 9.3 9.7 8.8* 8.9   GFR: Estimated Creatinine Clearance: 40.5 mL/min (by C-G  formula based on Cr of 0.73). Liver Function Tests:  Recent Labs Lab 08/29/15 2025 08/30/15 1533 09/02/15 0541  AST 24 23 17   ALT 12* 13* 11*  ALKPHOS 78 84 60  BILITOT 1.0 1.4* 0.9  PROT 6.4* 7.0 5.5*  ALBUMIN 3.6 4.0 3.1*    Recent Labs Lab 08/29/15 2035  LIPASE 23   No results for input(s): AMMONIA in the last 168 hours. Coagulation Profile: No results for input(s): INR, PROTIME in the last 168 hours. Cardiac Enzymes: No results for input(s): CKTOTAL, CKMB, CKMBINDEX, TROPONINI in the last 168 hours. BNP (last 3 results) No results for input(s): PROBNP in the last 8760 hours. HbA1C: No results for input(s): HGBA1C in the last 72 hours. CBG: No results for input(s): GLUCAP in the last 168 hours. Lipid Profile: No results for input(s): CHOL, HDL, LDLCALC, TRIG, CHOLHDL, LDLDIRECT in the last 72 hours. Thyroid Function Tests: No results for input(s): TSH, T4TOTAL, FREET4, T3FREE, THYROIDAB in the last 72 hours. Anemia Panel: No results for input(s): VITAMINB12, FOLATE, FERRITIN, TIBC, IRON, RETICCTPCT in the last 72 hours. Urine analysis:    Component Value Date/Time   COLORURINE AMBER* 08/30/2015 Jack Cantrell 08/30/2015 1724   LABSPEC 1.026 08/30/2015 1724   PHURINE 6.0 08/30/2015 1724   GLUCOSEU NEGATIVE 08/30/2015 1724   HGBUR NEGATIVE 08/30/2015 1724   BILIRUBINUR NEGATIVE 08/30/2015 1724   Jack Cantrell 08/30/2015 1724   PROTEINUR 30* 08/30/2015 1724   NITRITE NEGATIVE 08/30/2015 1724   LEUKOCYTESUR NEGATIVE 08/30/2015 1724   Sepsis Labs: Invalid input(s): PROCALCITONIN, LACTICIDVEN  Recent Results (from the past 240 hour(s))  Blood Culture (routine x 2)     Status: None (Preliminary result)   Collection Time: 08/30/15  3:37 PM  Result Value Ref Range Status   Specimen Description BLOOD LEFT ANTECUBITAL  Final   Special Requests BOTTLES DRAWN AEROBIC AND ANAEROBIC 5CC  Final   Culture   Final    NO GROWTH 2 DAYS Performed at [facility]    Report Status PENDING  Incomplete  Blood Culture (routine x 2)     Status: None (Preliminary result)   Collection Time: 08/30/15  3:38 PM  Result Value Ref Range Status   Specimen Description BLOOD RIGHT ANTECUBITAL  Final   Special Requests BOTTLES DRAWN AEROBIC AND ANAEROBIC 5CC  Final   Culture   Final    NO GROWTH 2 DAYS Performed at [facility]    Report Status PENDING  Incomplete  Urine culture     Status: None   Collection Time: 08/30/15  5:24 PM  Result Value Ref Range Status   Specimen Description URINE, CLEAN CATCH  Final   Special Requests NONE  Final   Culture   Final    NO GROWTH 1 DAY Performed at [facility]    Report Status 08/31/2015  FINAL  Final  MRSA PCR Screening     Status: None   Collection Time: 08/30/15  9:16 PM  Result Value Ref Range Status   MRSA by PCR NEGATIVE NEGATIVE Final    Comment:        The GeneXpert MRSA Assay (FDA approved for NASAL specimens only), is one component of a comprehensive MRSA colonization surveillance program. It is not intended to diagnose MRSA infection nor to guide or monitor treatment for MRSA infections.     Radiology Studies: Dg Knee 1-2 Views Left  09/12/15  CLINICAL DATA:  82 year old with acute onset of left knee pain and swelling which began today. No known injury. EXAM: LEFT KNEE - 1-2 VIEW COMPARISON:  None. FINDINGS: Very large joint effusion. No evidence of acute or subacute fracture or dislocation. Chondrocalcinosis involving the lateral and medial menisci. Mild to moderate medial compartment joint space narrowing. Patellofemoral and lateral compartment joint spaces well preserved. Mild osseous demineralization. No other intrinsic osseous abnormality. IMPRESSION: 1. No acute or subacute osseous abnormality. 2. CPPD. 3. 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 4. Very large joint effusion. Electronically Signed   By: Evangeline Dakin M.D.   On: 2015/09/12 09:28    Dg Chest Port 1 View  08/31/2015  CLINICAL DATA:  82 year old male status post endoscopy with food impaction in esophagus discovered. Query aspiration. Initial encounter. EXAM: PORTABLE CHEST 1 VIEW COMPARISON:  08/30/2015. FINDINGS: Portable AP semi upright view at 1541 hours. Continued moderate elevation of the left hemidiaphragm and patchy opacity at the left lung base. No confluent opacity in the right lung. Stable cardiomegaly and mediastinal contours. No pneumothorax. No definite effusion. IMPRESSION: Continued moderate elevation of the left hemidiaphragm with nonspecific left lung base opacity which could reflect some left lung aspiration. No definite acute findings on the right. Electronically Signed   By: Genevie Ann M.D.   On: 08/31/2015 15:56   Scheduled Meds: . allopurinol  300 mg Oral Daily  . ampicillin-sulbactam (UNASYN) IV  3 g Intravenous Q6H  . antiseptic oral rinse  7 mL Mouth Rinse BID  . colchicine  0.6 mg Oral Daily  . hyoscyamine  0.375 mg Oral BID  . ondansetron (ZOFRAN) IV  4 mg Intravenous 4 times per day  . pantoprazole (PROTONIX) IV  40 mg Intravenous Q12H   Continuous Infusions: . sodium chloride 100 mL/hr at 09/01/15 0317   Time spent: 25 minutes, > 50% bedside and discussing with son  Marzetta Board, MD, PhD [facility] Pager [phone]  If 7PM-7AM, please contact night-coverage [email or web address] Password [email or web address] 09/12/15, 1:21 PM

## 2015-09-02 NOTE — Progress Notes (Signed)
[facility] PROGRESS NOTE Subjective Pt tolerating liquids ok with some coughing Son in room notes pt has had this problem for some time. He was dilated by GI in [place] about 7-8 years ago  Objective: Vital signs in last 24 hours: Temp:  [97.6 F (36.4 C)-99.5 F (37.5 C)] 97.6 F (36.4 C) (04/09 0628) Pulse Rate:  [72-87] 72 (04/09 0628) Resp:  [16-18] 16 (04/09 0628) BP: (149-167)/(60-68) 158/60 mmHg (04/09 0628) SpO2:  [98 %-100 %] 99 % (04/09 0628) Last BM Date: 08/29/15  Intake/Output from previous day: 04/08 0701 - 04/09 0700 In: 2147 [P.O.:420; I.V.:1727] Out: -  Intake/Output this shift: Total I/O In: 240 [P.O.:240] Out: -     Lab Results:  Recent Labs  08/30/15 1533 09/01/15 0557 09/02/15 0541  WBC 20.1* 9.4 11.7*  HGB 14.3 10.9* 11.1*  HCT 42.9 33.9* 33.1*  PLT 243 177 172   BMET  Recent Labs  08/30/15 1533 09/01/15 0557 09/02/15 0541  NA 140 143 143  K 4.0 3.2* 3.0*  CL 106 113* 109  CO2 25 22 28   CREATININE 0.82 0.72 0.73   LFT  Recent Labs  08/30/15 1533 09/02/15 0541  PROT 7.0 5.5*  AST 23 17  ALT 13* 11*  ALKPHOS 84 60  BILITOT 1.4* 0.9   PT/INR No results for input(s): LABPROT, INR in the last 72 hours. PANCREAS No results for input(s): LIPASE in the last 72 hours.       Studies/Results: Dg Knee 1-2 Views Left  09/02/2015  CLINICAL DATA:  82 year old with acute onset of left knee pain and swelling which began today. No known injury. EXAM: LEFT KNEE - 1-2 VIEW COMPARISON:  None. FINDINGS: Very large joint effusion. No evidence of acute or subacute fracture or dislocation. Chondrocalcinosis involving the lateral and medial menisci. Mild to moderate medial compartment joint space narrowing. Patellofemoral and lateral compartment joint spaces well preserved. Mild osseous demineralization. No other intrinsic osseous abnormality. IMPRESSION: 1. No acute or subacute osseous abnormality. 2. CPPD. 3. Mild to moderate  medial compartment osteoarthritis. 4. Very large joint effusion. Electronically Signed   By: Evangeline Dakin M.D.   On: 09/02/2015 09:28   Dg Chest Port 1 View  08/31/2015  CLINICAL DATA:  82 year old male status post endoscopy with food impaction in esophagus discovered. Query aspiration. Initial encounter. EXAM: PORTABLE CHEST 1 VIEW COMPARISON:  08/30/2015. FINDINGS: Portable AP semi upright view at 1541 hours. Continued moderate elevation of the left hemidiaphragm and patchy opacity at the left lung base. No confluent opacity in the right lung. Stable cardiomegaly and mediastinal contours. No pneumothorax. No definite effusion. IMPRESSION: Continued moderate elevation of the left hemidiaphragm with nonspecific left lung base opacity which could reflect some left lung aspiration. No definite acute findings on the right. Electronically Signed   By: Genevie Ann M.D.   On: 08/31/2015 15:56    Medications: I have reviewed the patient's current medications.  Assessment/Plan: 1. Dysphagia. Esophagus packed with food ? Motility or due to stricture or both. Will get BS with pill and problem gentle dilation next few days. Discussed with son.   Letticia Bhattacharyya JR,Brealyn Baril L 09/02/2015, 12:37 PM  This note was created using voice recognition software. Minor errors may Have occurred unintentionally.  Pager: [phone] If no answer or after hours call [phone]

## 2015-09-03 ENCOUNTER — Inpatient Hospital Stay (HOSPITAL_COMMUNITY): Payer: Medicare Other

## 2015-09-03 MED ORDER — POTASSIUM CHLORIDE 20 MEQ/15ML (10%) PO SOLN
40.0000 meq | Freq: Once | ORAL | Status: AC
  Administered 2015-09-03: 40 meq via ORAL
  Filled 2015-09-03: qty 30

## 2015-09-03 NOTE — Progress Notes (Signed)
Jack Cantrell 3:10 PM  Subjective: Patient without any new complaints and his case discussed with my partner Dr. Oletta Lamas and he initially had not had his barium swallow when I saw him but has since had it  Objective: Vital signs stable afebrile no acute distress abdomen is soft nontender barium swallow with severe esophageal dysmotility  Assessment: Severe esophageal dysmotility  Plan: Might need to have speech therapy evaluate to help decide on appropriate diet and nutrition team as well and can hold off on endoscopy and possible dilation for now but consider it at some point in the future if symptoms continue and might need to consider possibly low-dose Reglan or erythromycin as motility agents  Jack Cantrell E  Pager [phone] After 5PM or if no answer call [phone]

## 2015-09-03 NOTE — Progress Notes (Signed)
PROGRESS NOTE  Jack Cantrell [id number] DOB: 02/06/1915 DOA: 08/30/2015 PCP: Kirk Ruths., MD Outpatient Specialists:    LOS: 4 days   Brief Narrative: 82 year old male with a past medical history of a hiatal hernia, gout, nutcracker esophagus; who presents from the [place] living facility with a 4 day history of coughing. Appearing to be dehydrated and confused.   Assessment & Plan: Principal Problem:   Sepsis ([place]) Active Problems:   Pneumonia   Pressure ulcer   BPH (benign prostatic hypertrophy)   Nutcracker esophagus  Dysphagia - This problem has been chronic, history of nutcracker esophagus and strictures. Last endoscopy several years ago. - Gastroenterology was consulted, and patient underwent an EGD 4/7 which showed food stuck in the esophagus status post removal, with local irritation and erythema as well as esophageal stenosis which was not dilated due to friable appearance. - management per GI, esophagogram today and will be re-evaluated for repeat EGD - continue zofran and protonix  Sepsis likely due to aspiration pneumonia  - On admission, sepsis physiology resolved, continue Unasyn  BPH (benign prostatic hypertrophy)  HOH  Dementia - continue Aricept  Knee swelling - chronic, bilateral, patient endorses history of gout, resumed his home allopurinol, give colchicine for few days - XR negative for fractures, has swelling - improving today    DVT prophylaxis: SCDs Code Status: DO NOT RESUSCITATE Family Communication: no family at bedside Disposition Plan: ILF when ready Barriers for discharge: GI workup  Consultants:   Gastroenterology  Procedures:   EGD 4/7  Antimicrobials:  Unasyn   Subjective: - no complaints this morning, knee pain improving  Objective: Filed Vitals:   09/02/15 0628 09/02/15 2158 09/03/15 0243 09/03/15 0623  BP: 158/60 167/78 157/71 149/74  Pulse: 72 96 76 76  Temp: 97.6 F (36.4 C) 100.1 F  (37.8 C) 98.6 F (37 C) 99.2 F (37.3 C)  TempSrc: Axillary Oral Oral Oral  Resp: 16   20  Height:      Weight:      SpO2: 99% 96% 100% 98%    Intake/Output Summary (Last 24 hours) at 09/03/15 1155 Last data filed at 09/03/15 0512  Gross per 24 hour  Intake   1523 ml  Output    700 ml  Net    823 ml   Filed Weights   08/31/15 0100  Weight: 61.508 kg (135 lb 9.6 oz)   Examination: Constitutional: NAD, calm, comfortable, confused  Filed Vitals:   09/02/15 0628 09/02/15 2158 09/03/15 0243 09/03/15 0623  BP: 158/60 167/78 157/71 149/74  Pulse: 72 96 76 76  Temp: 97.6 F (36.4 C) 100.1 F (37.8 C) 98.6 F (37 C) 99.2 F (37.3 C)  TempSrc: Axillary Oral Oral Oral  Resp: 16   20  Height:      Weight:      SpO2: 99% 96% 100% 98%  Eyes: PERRL  ENMT: Mucous membranes are moist.  Respiratory: clear to auscultation bilaterally, no wheezing, no crackles.  Cardiovascular: Regular rate and rhythm, no murmurs / rubs / gallops.  Abdomen: no tenderness, no masses palpated. Bowel sounds positive.  Musculoskeletal: no clubbing / cyanosis. Bilateral knee swelling, L > R, no erythema, minimally tender to palpation  Neurologic: non focal  Psychiatric: AxO to person    Data Reviewed: I have personally reviewed following labs and imaging studies  CBC:  Recent Labs Lab 08/29/15 2025 08/30/15 1533 09/01/15 0557 09/02/15 0541  WBC 17.4* 20.1* 9.4 11.7*  NEUTROABS 15.9* 18.8*  --   --  HGB 13.1 14.3 10.9* 11.1*  HCT 40.3 42.9 33.9* 33.1*  MCV 88.8 89.6 92.4 89.0  PLT 209 243 177 [id number]   Basic Metabolic Panel:  Recent Labs Lab 08/29/15 2025 08/30/15 1533 09/01/15 0557 09/02/15 0541  NA 139 140 143 143  K 3.7 4.0 3.2* 3.0*  CL 105 106 113* 109  CO2 23 25 22 28   GLUCOSE 111* 133* 74 118*  BUN 11 19 19 12   CREATININE 0.76 0.82 0.72 0.73  CALCIUM 9.3 9.7 8.8* 8.9   GFR: Estimated Creatinine Clearance: 40.5 mL/min (by C-G formula based on Cr of 0.73). Liver Function  Tests:  Recent Labs Lab 08/29/15 2025 08/30/15 1533 09/02/15 0541  AST 24 23 17   ALT 12* 13* 11*  ALKPHOS 78 84 60  BILITOT 1.0 1.4* 0.9  PROT 6.4* 7.0 5.5*  ALBUMIN 3.6 4.0 3.1*    Recent Labs Lab 08/29/15 2035  LIPASE 23   No results for input(s): AMMONIA in the last 168 hours. Coagulation Profile: No results for input(s): INR, PROTIME in the last 168 hours. Cardiac Enzymes: No results for input(s): CKTOTAL, CKMB, CKMBINDEX, TROPONINI in the last 168 hours. BNP (last 3 results) No results for input(s): PROBNP in the last 8760 hours. HbA1C: No results for input(s): HGBA1C in the last 72 hours. CBG: No results for input(s): GLUCAP in the last 168 hours. Lipid Profile: No results for input(s): CHOL, HDL, LDLCALC, TRIG, CHOLHDL, LDLDIRECT in the last 72 hours. Thyroid Function Tests: No results for input(s): TSH, T4TOTAL, FREET4, T3FREE, THYROIDAB in the last 72 hours. Anemia Panel: No results for input(s): VITAMINB12, FOLATE, FERRITIN, TIBC, IRON, RETICCTPCT in the last 72 hours. Urine analysis:    Component Value Date/Time   COLORURINE AMBER* 08/30/2015 [place] 08/30/2015 1724   LABSPEC 1.026 08/30/2015 1724   PHURINE 6.0 08/30/2015 1724   GLUCOSEU NEGATIVE 08/30/2015 1724   HGBUR NEGATIVE 08/30/2015 1724   BILIRUBINUR NEGATIVE 08/30/2015 1724   [place] 08/30/2015 1724   PROTEINUR 30* 08/30/2015 1724   NITRITE NEGATIVE 08/30/2015 1724   LEUKOCYTESUR NEGATIVE 08/30/2015 1724   Sepsis Labs: Invalid input(s): PROCALCITONIN, LACTICIDVEN  Recent Results (from the past 240 hour(s))  Blood Culture (routine x 2)     Status: None (Preliminary result)   Collection Time: 08/30/15  3:37 PM  Result Value Ref Range Status   Specimen Description BLOOD LEFT ANTECUBITAL  Final   Special Requests BOTTLES DRAWN AEROBIC AND ANAEROBIC 5CC  Final   Culture   Final    NO GROWTH 3 DAYS Performed at [facility]    Report Status PENDING   Incomplete  Blood Culture (routine x 2)     Status: None (Preliminary result)   Collection Time: 08/30/15  3:38 PM  Result Value Ref Range Status   Specimen Description BLOOD RIGHT ANTECUBITAL  Final   Special Requests BOTTLES DRAWN AEROBIC AND ANAEROBIC 5CC  Final   Culture   Final    NO GROWTH 3 DAYS Performed at [facility]    Report Status PENDING  Incomplete  Urine culture     Status: None   Collection Time: 08/30/15  5:24 PM  Result Value Ref Range Status   Specimen Description URINE, CLEAN CATCH  Final   Special Requests NONE  Final   Culture   Final    NO GROWTH 1 DAY Performed at [facility]    Report Status 08/31/2015 FINAL  Final  MRSA PCR Screening  Status: None   Collection Time: 08/30/15  9:16 PM  Result Value Ref Range Status   MRSA by PCR NEGATIVE NEGATIVE Final    Comment:        The GeneXpert MRSA Assay (FDA approved for NASAL specimens only), is one component of a comprehensive MRSA colonization surveillance program. It is not intended to diagnose MRSA infection nor to guide or monitor treatment for MRSA infections.     Radiology Studies: Dg Knee 1-2 Views Left  09-29-15  CLINICAL DATA:  82 year old with acute onset of left knee pain and swelling which began today. No known injury. EXAM: LEFT KNEE - 1-2 VIEW COMPARISON:  None. FINDINGS: Very large joint effusion. No evidence of acute or subacute fracture or dislocation. Chondrocalcinosis involving the lateral and medial menisci. Mild to moderate medial compartment joint space narrowing. Patellofemoral and lateral compartment joint spaces well preserved. Mild osseous demineralization. No other intrinsic osseous abnormality. IMPRESSION: 1. No acute or subacute osseous abnormality. 2. CPPD. 3. 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 4. Very large joint effusion. Electronically Signed   By: Evangeline Dakin M.D.   On: 09-29-15 09:28   Scheduled Meds: . allopurinol  300 mg Oral  Daily  . ampicillin-sulbactam (UNASYN) IV  3 g Intravenous Q6H  . antiseptic oral rinse  7 mL Mouth Rinse BID  . colchicine  0.6 mg Oral Daily  . hyoscyamine  0.375 mg Oral BID  . ondansetron (ZOFRAN) IV  4 mg Intravenous 4 times per day  . pantoprazole (PROTONIX) IV  40 mg Intravenous Q12H   Continuous Infusions: . sodium chloride 100 mL/hr at 09/01/15 0317   Marzetta Board, MD, PhD [facility] Pager [phone]  If 7PM-7AM, please contact night-coverage [email or web address] Password [email or web address] 09/03/2015, 11:55 AM

## 2015-09-03 NOTE — Care Management Important Message (Signed)
Important Message  Patient Details  Name: Jack [place] MRN: [id number] Date of Birth: [birth year]   Medicare Important Message Given:  Yes    Camillo Flaming 09/03/2015, 11:15 AMImportant Message  Patient Details  Name: Jack [place] MRN: [id number] Date of Birth: Nov 01, 1914   Medicare Important Message Given:  Yes    Camillo Flaming 09/03/2015, 11:15 AM

## 2015-09-04 ENCOUNTER — Inpatient Hospital Stay (HOSPITAL_COMMUNITY): Payer: Medicare Other

## 2015-09-04 ENCOUNTER — Encounter (HOSPITAL_COMMUNITY): Payer: Self-pay | Admitting: Gastroenterology

## 2015-09-04 LAB — URINALYSIS, ROUTINE W REFLEX MICROSCOPIC
BILIRUBIN URINE: NEGATIVE
Glucose, UA: NEGATIVE mg/dL
HGB URINE DIPSTICK: NEGATIVE
Ketones, ur: NEGATIVE mg/dL
Leukocytes, UA: NEGATIVE
Nitrite: NEGATIVE
PROTEIN: 30 mg/dL — AB
Specific Gravity, Urine: 1.03 (ref 1.005–1.030)
pH: 6 (ref 5.0–8.0)

## 2015-09-04 LAB — COMPREHENSIVE METABOLIC PANEL
ALBUMIN: 2.5 g/dL — AB (ref 3.5–5.0)
ALK PHOS: 52 U/L (ref 38–126)
ALT: 11 U/L — AB (ref 17–63)
AST: 15 U/L (ref 15–41)
Anion gap: 9 (ref 5–15)
BUN: 11 mg/dL (ref 6–20)
CALCIUM: 8.6 mg/dL — AB (ref 8.9–10.3)
CO2: 25 mmol/L (ref 22–32)
CREATININE: 0.63 mg/dL (ref 0.61–1.24)
Chloride: 106 mmol/L (ref 101–111)
GFR calc Af Amer: >60 mL/min (ref >60)
GFR calc non Af Amer: >60 mL/min (ref >60)
GLUCOSE: 112 mg/dL — AB (ref 65–99)
Potassium: 3.2 mmol/L — ABNORMAL LOW (ref 3.5–5.1)
SODIUM: 140 mmol/L (ref 135–145)
Total Bilirubin: 0.6 mg/dL (ref 0.3–1.2)
Total Protein: 5.3 g/dL — ABNORMAL LOW (ref 6.5–8.1)

## 2015-09-04 LAB — CBC
HCT: 32.5 % — ABNORMAL LOW (ref 39.0–52.0)
Hemoglobin: 10.9 g/dL — ABNORMAL LOW (ref 13.0–17.0)
MCH: 29.5 pg (ref 26.0–34.0)
MCHC: 33.5 g/dL (ref 30.0–36.0)
MCV: 88.1 fL (ref 78.0–100.0)
PLATELETS: 173 10*3/uL (ref 150–400)
RBC: 3.69 MIL/uL — ABNORMAL LOW (ref 4.22–5.81)
RDW: 14 % (ref 11.5–15.5)
WBC: 19.8 10*3/uL — ABNORMAL HIGH (ref 4.0–10.5)

## 2015-09-04 LAB — URINE MICROSCOPIC-ADD ON

## 2015-09-04 LAB — CULTURE, BLOOD (ROUTINE X 2)
CULTURE: NO GROWTH
CULTURE: NO GROWTH

## 2015-09-04 MED ORDER — ALUM & MAG HYDROXIDE-SIMETH 200-200-20 MG/5ML PO SUSP
15.0000 mL | Freq: Four times a day (QID) | ORAL | Status: DC | PRN
Start: 2015-09-04 — End: 2015-09-07

## 2015-09-04 MED ORDER — HYOSCYAMINE SULFATE ER 0.375 MG PO TB12
0.3750 mg | ORAL_TABLET | Freq: Two times a day (BID) | ORAL | Status: DC
  Administered 2015-09-05 – 2015-09-07 (×5): 0.375 mg via ORAL
  Filled 2015-09-04 (×7): qty 1

## 2015-09-04 MED ORDER — PANTOPRAZOLE SODIUM 40 MG PO TBEC
40.0000 mg | DELAYED_RELEASE_TABLET | Freq: Two times a day (BID) | ORAL | Status: DC
  Administered 2015-09-05 – 2015-09-07 (×5): 40 mg via ORAL
  Filled 2015-09-04 (×8): qty 1

## 2015-09-04 MED ORDER — FUROSEMIDE 10 MG/ML IJ SOLN
20.0000 mg | Freq: Once | INTRAMUSCULAR | Status: AC
  Administered 2015-09-04: 20 mg via INTRAVENOUS
  Filled 2015-09-04: qty 2

## 2015-09-04 MED ORDER — SUCRALFATE 1 GM/10ML PO SUSP
1.0000 g | Freq: Three times a day (TID) | ORAL | Status: DC
  Administered 2015-09-04 – 2015-09-07 (×12): 1 g via ORAL
  Filled 2015-09-04 (×15): qty 10

## 2015-09-04 NOTE — Progress Notes (Signed)
The patient is receiving Protonix by the intravenous route.  Based on criteria approved by the Pharmacy and [place], the medication is being converted to the equivalent oral dose form.  These criteria include: -No Active GI bleeding -Able to tolerate diet of full liquids (or better) or tube feeding -Able to tolerate other medications by the oral or enteral route  If you have any questions about this conversion, please contact the Pharmacy Department (phone [phone]).  Thank you.

## 2015-09-04 NOTE — Evaluation (Signed)
Clinical/Bedside Swallow Evaluation Patient Details  Name: LERIN CHAPPLE MRN: [id number] Date of Birth: May 14, 1915  Today's Date: 09/04/2015 Time: SLP Start Time (ACUTE ONLY): 1140 SLP Stop Time (ACUTE ONLY): 1220 SLP Time Calculation (min) (ACUTE ONLY): 40 min  Past Medical History:  Past Medical History  Diagnosis Date  . Esophageal abnormality   . Gout   . Cancer [facility])    Past Surgical History:  Past Surgical History  Procedure Laterality Date  . Joint replacement      Hip  . Esophagogastroduodenoscopy (egd) with propofol N/A 08/31/2015    Procedure: ESOPHAGOGASTRODUODENOSCOPY (EGD) WITH PROPOFOL;  Surgeon: Wonda Horner, MD;  Location: [facility] ENDOSCOPY;  Service: Endoscopy;  Laterality: N/A;   HPI:  82 year old male resident of facility with a past medical history of a hiatal hernia, gout, nutcracker esophagus - 4 day h/o coughing prompting admit on 09/02/15.  Pt had endoscopy 08/31/15 and food was lodged in esophagus requiring removal, dilatation not completed due to risks.   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 GI recommended SLP evaluation to assist with mitigation strategies/diet,etc.       Assessment / Plan / Recommendation Clinical Impression  BSE completed, full report to follow. Pt with appearance of decreased right facial/labial movement, symmetry = he reports uncertain if baseline. Pt denies neurological hx. SLP assisted pt with some of his lunch time meal.  Provided him with pork chop, greens, bite of roll and mashed potatoes as well as water.  No indication of aspiration with small single boluses of thin water however pt required cues to take small sips.  Anticipate his aspiration risk is due to known severe esophageal dysmotilty.    SLP recommends dys3/ground meat/thin with strict precautions to mitigate aspiration/esophageal risk. Provided  information in writing for pt and using teach back, reinforced strategies.  SLP reviewed items that may clear better with his dysmotilty including warmer liquids and avoiding hard/tough foods.    Further pt does report difficulty with sensing that liquids may lodge in his throat at times and problemswith food clearing esophagus. advised him to consume several small meals/day to mitigate symptoms and consider liquid nutritional supplement to aid nutrition.    Pt does appear with good awareness of his dysphagia.  Left written tips for family, please reorder if indicated.  Pt requested SLP not call his son stating "That's not necessary".    Thanks for this consult on this most pleasant pt.       Aspiration Risk  Moderate aspiration risk;Severe aspiration risk    Diet Recommendation   dys3/ground meat/thin  Medication Administration: Whole meds with puree    Other  Recommendations Oral Care Recommendations: Oral care BID   Follow up Recommendations     none  Frequency and Duration            Prognosis Prognosis for Safe Diet Advancement: Guarded Barriers to Reach Goals: Cognitive deficits;Time post onset;Severity of deficits      Swallow Study   General Date of Onset: 09/04/15 HPI: 82 year old male resident of facility with a past medical history of a hiatal hernia, gout, nutcracker esophagus - 4 day h/o coughing prompting admit on 09/02/15.  Pt had endoscopy 08/31/15 and food was lodged in esophagus requiring removal, dilatation not completed due to risks.   Pt underwent esophagram 4/10 with findings of severe esophageal dysmotility likely due to presbyesophagus, barium tablet was slow  to clear esophagus per radiologist note. GI recommended SLP evaluation to assist with mitigation strategies/diet,etc.     Type of Study: Bedside Swallow Evaluation Previous Swallow Assessment: see hhx Diet Prior to this Study: Dysphagia 3 (soft);Thin liquids Temperature Spikes Noted: Yes Respiratory  Status: Room air History of Recent Intubation: No Behavior/Cognition: Alert;Cooperative;Pleasant mood Oral Cavity Assessment: Erythema (uvula appears with abrasion, pt denies pain) Oral Care Completed by SLP: No Oral Cavity - Dentition: Adequate natural dentition Vision: Functional for self-feeding Self-Feeding Abilities: Needs assist (due to arm edema, pt reports arm swelling x six months) Patient Positioning: Upright in bed Baseline Vocal Quality: Normal Volitional Cough: Strong Volitional Swallow: Unable to elicit    Oral/Motor/Sensory Function Overall Oral Motor/Sensory Function:  (generalized weakness)   Ice Chips Ice chips: Not tested   Thin Liquid Thin Liquid: Impaired Presentation: Straw Pharyngeal  Phase Impairments: Cough - Delayed;Cough - Immediate Other Comments: cough with sequential boluses of thin, single small boluses tolerated    Nectar Thick Nectar Thick Liquid: Not tested   Honey Thick Honey Thick Liquid: Not tested   Puree Puree: Within functional limits Presentation: Spoon   Solid   GO   Solid: Impaired Pharyngeal Phase Impairments: Multiple swallows;Other (comments) Other Comments: sensation of residuals in pharynx, liquid-wash reportedly helped to clear once SLP provided to him        Luanna Salk, [facility] [place] [phone]

## 2015-09-04 NOTE — Progress Notes (Signed)
Patient has two PO medications ordered tonight. Patient took a sip of water but was unable to swallow medications in applesauce. Patient held applesauce and then spit it out. Patient drowsy, unable to focus to swallow, patient does not keep his eyes open very long. Holding PO medications for the evening.

## 2015-09-04 NOTE — Progress Notes (Signed)
BSE completed, full report to follow.  Pt with appearance of decreased right facial/labial movement, symmetry = he reports uncertain if baseline. Pt denies neurological hx.  SLP recommends dys3/ground meat/thin with strict precautions to mitigate aspiration/esophageal risk.  Provided information in writing for pt and using teach back, reinforced strategies.    Recommend dys3/ground meats/thin SINGLE sips of thin Start meal with liquids Follow solids with liquids  (consider warm) Cease intake if coughing or sense residuals that will not clear Several small meals/day  Pt advised this SLP at 1234 that I did not need to call his son Nadara Mustard, stating "It's not necessary".  Left written information with SLP name and number for family.  Thanks for this referral.    Luanna Salk, [place] [facility] SLP [phone]  .

## 2015-09-04 NOTE — Progress Notes (Signed)
Jack [place] 9:56 AM  Subjective: Patient without any new complaints and I had a long conversation with the patient's son yesterday on the phone and today in person including reviewing of his barium swallow and today with the daughter-in-law as well and we answered all of their many questions  Objective: Vital signs stable afebrile no acute distress abdomen is soft nontender white count increased other labs stable  Assessment: Esophageal dysmotility  Plan: After our long conversation about medicine options to include Reglan or erythromycin motolin or antispasmodics they request increasing the Levbid i.e. hyoscyamine dose to 1-1/2 which I tried to order on the computer and see if that helps and will follow with you and will advance diet to soft and consider speech therapy evaluation to assess if any dysphasia diet as needed otherwise discussed any of liquids sitting upright and possibly pured and soft foods and we discussed foods to avoid or chew well and either myself or Dr. Oletta Lamas happy to see back in a few weeks in follow-up  [facility]  Pager [phone] After 5PM or if no answer call [phone]

## 2015-09-04 NOTE — Progress Notes (Signed)
PROGRESS NOTE  Jack Cantrell [id number] DOB: 02-Jul-1914 DOA: 08/30/2015 PCP: Kirk Ruths., MD Outpatient Specialists:    LOS: 5 days   Brief Narrative: 82 year old male with a past medical history of a hiatal hernia, gout, nutcracker esophagus; who presents from the [facility] living facility with a 4 day history of coughing. Appearing to be dehydrated and confused.   Interim summary: Patient admitted with dysphagia and 4/6 as well as aspiration pneumonia, gastric neurology was consulted and patient underwent an EGD on 4/7 (as below). Following the EGD patient's diet was advanced, he was monitored, and underwent an esophagogram on 4/10, per GI no need for repeat EGD. 4/10-4/7 overnight, patient with increased fever curve (Tmax of 100), and 4/11 AM he was found to have worsening leukocytosis up to 19  Assessment & Plan: Principal Problem:   Sepsis ([place]) Active Problems:   Pneumonia   Pressure ulcer   BPH (benign prostatic hypertrophy)   Nutcracker esophagus  Dysphagia - This problem has been chronic, history of nutcracker esophagus and strictures. Last endoscopy several years ago. - Gastroenterology was consulted, and patient underwent an EGD 4/7 which showed food stuck in the esophagus status post removal, with local irritation and erythema as well as esophageal stenosis which was not dilated due to friable appearance. - management per GI, esophagogram 4/10 with severe dysmotility without any specific dominant esophageal stricture  - continue zofran and protonix  Fever/increased leukocytosis - Repeat chest x-ray this morning showed slight interval increase of air space opacity in the left lung - Clinically he looks well, continue current antibiotic regimen for aspiration pneumonia - Obtain urinalysis to evaluate for UTI - Continue to monitor  Sepsis likely due to aspiration pneumonia  - On admission, sepsis physiology resolved, continue Unasyn - increased WBC,  unclear etiology now, continue antibiotics  BPH (benign prostatic hypertrophy)  HOH  Dementia - continue Aricept  Knee swelling - chronic, bilateral, patient endorses history of gout, resumed his home allopurinol, give colchicine for few days - XR negative for fractures, has swelling - improving, Unlikely to be the source for leukocytosis and fever as pain is decreasing and clinically feels better as far as his knees are concerned   DVT prophylaxis: SCDs Code Status: DO NOT RESUSCITATE Family Communication: d/w son Nadara Mustard bedside Disposition Plan: ILF when ready Barriers for discharge: GI workup  Consultants:   Gastroenterology  Procedures:   EGD 4/7  Antimicrobials:  Unasyn   Subjective: - no complaints this morning, knee pain improving  Objective: Filed Vitals:   09/03/15 1300 09/03/15 2243 09/04/15 0442 09/04/15 0600  BP: 150/67 154/70 157/84   Pulse: 80 90 99   Temp: 98.8 F (37.1 C) 100 F (37.8 C) 99.9 F (37.7 C)   TempSrc: Axillary Oral Oral   Resp: 20 19 19 26   Height:      Weight:      SpO2: 98% 98% 97%     Intake/Output Summary (Last 24 hours) at 09/04/15 1219 Last data filed at 09/04/15 1147  Gross per 24 hour  Intake 1441.67 ml  Output   1325 ml  Net 116.67 ml   Filed Weights   08/31/15 0100  Weight: 61.508 kg (135 lb 9.6 oz)   Examination: Constitutional: NAD, calm, comfortable, confused  Filed Vitals:   09/03/15 1300 09/03/15 2243 09/04/15 0442 09/04/15 0600  BP: 150/67 154/70 157/84   Pulse: 80 90 99   Temp: 98.8 F (37.1 C) 100 F (37.8 C) 99.9 F (37.7 C)  TempSrc: Axillary Oral Oral   Resp: 20 19 19 26   Height:      Weight:      SpO2: 98% 98% 97%   ENMT: Mucous membranes are moist.  Respiratory: clear to auscultation bilaterally, no wheezing, no crackles.  Cardiovascular: Regular rate and rhythm, no murmurs / rubs / gallops.  Abdomen: no tenderness, no masses palpated. Bowel sounds positive.  Musculoskeletal: no  clubbing / cyanosis. Bilateral knee swelling, L > R, no erythema, minimally tender to palpation  Neurologic: non focal  Psychiatric: AxO to person    Data Reviewed: I have personally reviewed following labs and imaging studies  CBC:  Recent Labs Lab 08/29/15 2025 08/30/15 1533 09/01/15 0557 09/02/15 0541 09/04/15 0529  WBC 17.4* 20.1* 9.4 11.7* 19.8*  NEUTROABS 15.9* 18.8*  --   --   --   HGB 13.1 14.3 10.9* 11.1* 10.9*  HCT 40.3 42.9 33.9* 33.1* 32.5*  MCV 88.8 89.6 92.4 89.0 88.1  PLT 209 243 177 172 [id number]   Basic Metabolic Panel:  Recent Labs Lab 08/29/15 2025 08/30/15 1533 09/01/15 0557 09/02/15 0541 09/04/15 0529  NA 139 140 143 143 140  K 3.7 4.0 3.2* 3.0* 3.2*  CL 105 106 113* 109 106  CO2 23 25 22 28 25   GLUCOSE 111* 133* 74 118* 112*  BUN 11 19 19 12 11   CREATININE 0.76 0.82 0.72 0.73 0.63  CALCIUM 9.3 9.7 8.8* 8.9 8.6*   GFR: Estimated Creatinine Clearance: 40.5 mL/min (by C-G formula based on Cr of 0.63). Liver Function Tests:  Recent Labs Lab 08/29/15 2025 08/30/15 1533 09/02/15 0541 09/04/15 0529  AST 24 23 17 15   ALT 12* 13* 11* 11*  ALKPHOS 78 84 60 52  BILITOT 1.0 1.4* 0.9 0.6  PROT 6.4* 7.0 5.5* 5.3*  ALBUMIN 3.6 4.0 3.1* 2.5*    Recent Labs Lab 08/29/15 2035  LIPASE 23   No results for input(s): AMMONIA in the last 168 hours. Coagulation Profile: No results for input(s): INR, PROTIME in the last 168 hours. Cardiac Enzymes: No results for input(s): CKTOTAL, CKMB, CKMBINDEX, TROPONINI in the last 168 hours. BNP (last 3 results) No results for input(s): PROBNP in the last 8760 hours. HbA1C: No results for input(s): HGBA1C in the last 72 hours. CBG: No results for input(s): GLUCAP in the last 168 hours. Lipid Profile: No results for input(s): CHOL, HDL, LDLCALC, TRIG, CHOLHDL, LDLDIRECT in the last 72 hours. Thyroid Function Tests: No results for input(s): TSH, T4TOTAL, FREET4, T3FREE, THYROIDAB in the last 72 hours. Anemia  Panel: No results for input(s): VITAMINB12, FOLATE, FERRITIN, TIBC, IRON, RETICCTPCT in the last 72 hours. Urine analysis:    Component Value Date/Time   COLORURINE YELLOW 09/04/2015 [place] 09/04/2015 0939   LABSPEC 1.030 09/04/2015 0939   PHURINE 6.0 09/04/2015 0939   GLUCOSEU NEGATIVE 09/04/2015 0939   HGBUR NEGATIVE 09/04/2015 0939   BILIRUBINUR NEGATIVE 09/04/2015 0939   KETONESUR NEGATIVE 09/04/2015 0939   PROTEINUR 30* 09/04/2015 0939   NITRITE NEGATIVE 09/04/2015 0939   LEUKOCYTESUR NEGATIVE 09/04/2015 0939   Sepsis Labs: Invalid input(s): PROCALCITONIN, LACTICIDVEN  Recent Results (from the past 240 hour(s))  Blood Culture (routine x 2)     Status: None (Preliminary result)   Collection Time: 08/30/15  3:37 PM  Result Value Ref Range Status   Specimen Description BLOOD LEFT ANTECUBITAL  Final   Special Requests BOTTLES DRAWN AEROBIC AND ANAEROBIC 5CC  Final   Culture   Final  NO GROWTH 4 DAYS Performed at [facility]    Report Status PENDING  Incomplete  Blood Culture (routine x 2)     Status: None (Preliminary result)   Collection Time: 08/30/15  3:38 PM  Result Value Ref Range Status   Specimen Description BLOOD RIGHT ANTECUBITAL  Final   Special Requests BOTTLES DRAWN AEROBIC AND ANAEROBIC 5CC  Final   Culture   Final    NO GROWTH 4 DAYS Performed at [facility]    Report Status PENDING  Incomplete  Urine culture     Status: None   Collection Time: 08/30/15  5:24 PM  Result Value Ref Range Status   Specimen Description URINE, CLEAN CATCH  Final   Special Requests NONE  Final   Culture   Final    NO GROWTH 1 DAY Performed at [facility]    Report Status 08/31/2015 FINAL  Final  MRSA PCR Screening     Status: None   Collection Time: 08/30/15  9:16 PM  Result Value Ref Range Status   MRSA by PCR NEGATIVE NEGATIVE Final    Comment:        The GeneXpert MRSA Assay (FDA approved for NASAL specimens only), is  one component of a comprehensive MRSA colonization surveillance program. It is not intended to diagnose MRSA infection nor to guide or monitor treatment for MRSA infections.     Radiology Studies: Dg Esophagus  09/03/2015  CLINICAL DATA:  Evaluate esophageal dysmotility and dysphagia. History of "Nutcracker esophagus" . EXAM: ESOPHOGRAM/BARIUM SWALLOW TECHNIQUE: Single contrast examination was performed using  thin barium. FLUOROSCOPY TIME:  Radiation Exposure Index (as provided by the fluoroscopic device): 32.4 mg Y Number of Acquired Images:  None COMPARISON:  Chest radiograph of 08/31/2015 and back to 08/29/2015. FINDINGS: Focused, single-contrast exam performed in an LPO position (secondary to patient clinical status). Incomplete primary peristaltic wave with tertiary contractions and contrast stasis throughout the thoracic esophagus. No persistent dominant esophageal stricture identified. Contrast passage into the stomach, which is normal in caliber and positioned under in elevated left hemidiaphragm. 13 mm barium tablet has delayed passage in the distal esophagus, including on series 41. IMPRESSION: 1. Limited exam, as detailed above. 2. Severe esophageal dysmotility, likely presbyesophagus. 3. No dominant esophageal stricture. 4. Stomach positioned under an elevated left hemidiaphragm. 5. Delayed passage of 13 mm barium tablet, likely due to esophageal dysmotility. Electronically Signed   By: Abigail Miyamoto M.D.   On: 09/03/2015 12:18   Dg Chest Port 1 View  09/04/2015  CLINICAL DATA:  Onset of shortness of breath today, no other complaints, history of pneumonia, former smoker. EXAM: PORTABLE CHEST 1 VIEW COMPARISON:  Portable chest x-ray of August 31, 2015 FINDINGS: Chronic elevation of the left hemidiaphragm is again observed. Interstitial density in the inferior aspect of the aerated portion of the left lung is slightly more conspicuous today. The right lung is adequately inflated. The  interstitial markings are coarse though stable. There is stable mild blunting of the right lateral costophrenic angle. The cardiac silhouette is enlarged but its left border indistinct. There is multilevel degenerative disc disease of the thoracic spine. IMPRESSION: Slight interval increase in airspace opacity in the inferior aspect of the left lung. This is accentuated by persistent elevation of the left hemidiaphragm. Stable chronic bronchitic changes in the right lung. Stable cardiomegaly without pulmonary vascular congestion. Electronically Signed   By: David  Martinique M.D.   On: 09/04/2015 07:49   Scheduled Meds: . allopurinol  300 mg Oral Daily  . ampicillin-sulbactam (UNASYN) IV  3 g Intravenous Q6H  . antiseptic oral rinse  7 mL Mouth Rinse BID  . colchicine  0.6 mg Oral Daily  . hyoscyamine  0.375 mg Oral BID  . ondansetron (ZOFRAN) IV  4 mg Intravenous 4 times per day  . pantoprazole  40 mg Oral BID   Continuous Infusions:   Marzetta Board, MD, PhD [facility] Pager [phone]  If 7PM-7AM, please contact night-coverage [email or web address] Password [facility] 09/04/2015, 12:19 PM

## 2015-09-04 NOTE — Progress Notes (Signed)
Initial Nutrition Assessment  DOCUMENTATION CODES:   Not applicable  INTERVENTION:  - Diet per SLP recommendation - RD will continue to monitor for needs based on SLP recommendation  NUTRITION DIAGNOSIS:   Swallowing difficulty related to lethargy/confusion, dysphagia as evidenced by per patient/family report, other (see comment) (RN report).   GOAL:   Patient will meet greater than or equal to 90% of their needs  MONITOR:   PO intake, Weight trends, Labs, Skin, I & O's  REASON FOR ASSESSMENT:   Low Braden  ASSESSMENT:   82 year old male with a past medical history of a hiatal hernia, gout, nutcracker esophagus; who presents from the [facility] living facility with a 4 day history of coughing. appearing to be dehydrated and confused. Was seen in the ED yesterday and thought to have pneumonia for which he was given Levaquin. He only took about 2 doses. Son notes that he is coughing and spitting up anytime he tries to eat or drink anything. Denies vomiting blood. This morning he had red Jell-O around 11 AM, but still appears to be spitting up thick reddish material. Son notes that he has previously been treated for esophageal dysmotility with a dilation in his early 45s and subsequently was advised to eat a repeat dilation for which the family declined due to his age. Putting him under general anesthesia. History of esophageal nutcracker is being currently treated with Hyoscyamine. Due to the patient's frequent coughing he's had decreased overall appetite and generalized weakness. Denies any chest pain, shortness of breath, fever, or diarrhea.  Pt seen for low Braden. BMI indicates normal weight. Pt with hx of dementia and was unable to provide relevant information; no family/visitors at bedside. Notes indicate that pt had decreased appetite and weakness PTA. Noted spots of food on pt's hospital gown. Lunch arrived during RD visit. Spoke with RN outside of the room and she  reports pt coughed and spit with intakes of thin liquids and eggs this AM but tolerated applesauce without issue. She states SLP to evaluate pt (SLP currently working with pt at time of writing this note).   Physical assessment shows no muscle or fat wasting, mild edema to RUE and RLE and severe edema to LUE. Per chart review, pt was 155 lbs on 08/29/15 and 135 lbs on 08/31/15 with no other weight recordings available; will monitor weight trends during admission in light of edema.   Per MD note 4/10 @ 1155:  Dysphagia - This problem has been chronic, history of nutcracker esophagus and strictures. Last endoscopy several years ago. - Gastroenterology was consulted, and patient underwent an EGD 4/7 which showed food stuck in the esophagus status post removal, with local irritation and erythema as well as esophageal stenosis which was not dilated due to friable appearance. - management per GI, esophagogram today and will be re-evaluated for repeat EGD Per GI note 4/10 @ [id number]: Might need to have speech therapy evaluate to help decide on appropriate diet and nutrition team as well and can hold off on endoscopy and possible dilation for now but consider it at some point in the future if symptoms continue and might need to consider possibly low-dose Reglan or erythromycin as motility agents   Will monitor for SLP recommendations concerning diet and intervene accordingly.Not meeting needs. Medications reviewed. Labs reviewed; K: 3.2 mmol/L, Ca: 8.6 mg/dL.   Diet Order:  DIET SOFT Room service appropriate?: Yes; Fluid consistency:: Thin  Skin:  Wound (see comment) (Stage 1 sacral pressure injury)  Last BM:  4/8  Height:   Ht Readings from Last 1 Encounters:  08/31/15 5\' 3"  (1.6 m)    Weight:   Wt Readings from Last 1 Encounters:  08/31/15 135 lb 9.6 oz (61.508 kg)    Ideal Body Weight:  56.36 kg (kg)  BMI:  Body mass index is 24.03 kg/(m^2).  Estimated Nutritional Needs:   Kcal:  1230-1415  (20-23 kcal/kg)  Protein:  60-70 grams  Fluid:  >/= 1.5 L/day  EDUCATION NEEDS:   No education needs identified at this time    Jarome Matin, RD, LDN Inpatient Clinical Dietitian Pager # [phone] After hours/weekend pager # [phone]

## 2015-09-05 DIAGNOSIS — N400 Enlarged prostate without lower urinary tract symptoms: Secondary | ICD-10-CM

## 2015-09-05 DIAGNOSIS — L8990 Pressure ulcer of unspecified site, unspecified stage: Secondary | ICD-10-CM

## 2015-09-05 DIAGNOSIS — A419 Sepsis, unspecified organism: Principal | ICD-10-CM

## 2015-09-05 DIAGNOSIS — K224 Dyskinesia of esophagus: Secondary | ICD-10-CM

## 2015-09-05 DIAGNOSIS — J690 Pneumonitis due to inhalation of food and vomit: Secondary | ICD-10-CM

## 2015-09-05 DIAGNOSIS — F0391 Unspecified dementia with behavioral disturbance: Secondary | ICD-10-CM

## 2015-09-05 LAB — BASIC METABOLIC PANEL
Anion gap: 9 (ref 5–15)
BUN: 15 mg/dL (ref 6–20)
CHLORIDE: 103 mmol/L (ref 101–111)
CO2: 29 mmol/L (ref 22–32)
Calcium: 8.7 mg/dL — ABNORMAL LOW (ref 8.9–10.3)
Creatinine, Ser: 0.63 mg/dL (ref 0.61–1.24)
GFR calc Af Amer: >60 mL/min (ref >60)
GFR calc non Af Amer: >60 mL/min (ref >60)
GLUCOSE: 100 mg/dL — AB (ref 65–99)
POTASSIUM: 3 mmol/L — AB (ref 3.5–5.1)
Sodium: 141 mmol/L (ref 135–145)

## 2015-09-05 LAB — CBC
HEMATOCRIT: 31.7 % — AB (ref 39.0–52.0)
HEMOGLOBIN: 10.4 g/dL — AB (ref 13.0–17.0)
MCH: 29.1 pg (ref 26.0–34.0)
MCHC: 32.8 g/dL (ref 30.0–36.0)
MCV: 88.5 fL (ref 78.0–100.0)
Platelets: 179 10*3/uL (ref 150–400)
RBC: 3.58 MIL/uL — AB (ref 4.22–5.81)
RDW: 13.8 % (ref 11.5–15.5)
WBC: 12.1 10*3/uL — ABNORMAL HIGH (ref 4.0–10.5)

## 2015-09-05 MED ORDER — POTASSIUM CHLORIDE CRYS ER 20 MEQ PO TBCR
40.0000 meq | EXTENDED_RELEASE_TABLET | Freq: Four times a day (QID) | ORAL | Status: AC
  Administered 2015-09-05 (×2): 40 meq via ORAL
  Filled 2015-09-05 (×2): qty 2

## 2015-09-05 MED ORDER — FUROSEMIDE 10 MG/ML IJ SOLN
40.0000 mg | Freq: Once | INTRAMUSCULAR | Status: AC
  Administered 2015-09-05: 40 mg via INTRAVENOUS
  Filled 2015-09-05: qty 4

## 2015-09-05 NOTE — Progress Notes (Signed)
Jack Cantrell 1:02 PM  Subjective: Patient without any complaints today and swallowing and eating well and no new complaints  Objective: Vital signs stable afebrile abdomen is soft nontender no acute distress decreased white count  Assessment: Improved  Plan: Please let me know if I can help any further with this hospital stay otherwise patient will follow up with either me or my partner Dr. Oletta Lamas as an outpatient in a month and unless needed sooner when necessary  [facility] E  Pager [phone] After 5PM or if no answer call [phone]

## 2015-09-05 NOTE — Progress Notes (Signed)
Physical Therapy Treatment Patient Details Name: Jack Cantrell MRN: [id number] DOB: 1914/08/03 Today's Date: 09/05/2015    History of Present Illness Pt admitted with coughing, dehydration and altered mental status. Found to have esophageal impaction. Lives in independent living performing transfers to /from bed to [facility] and toilet independently per patient.     PT Comments    Pt attempted bed to recliner transfer (as if he was doing it in his independent apartment) and pt needed assist with supine to sit with bed mobility, and required great assist to attempt transfer the way he does in his own environment. We were not able to achieve that, however were able to do a Max A of 2 person slide over to the recliner. Pt did realize that it was difficult and he was not able to perform this transfer as he has before. It was also difficult for him to utilize BUEs due to his increased edema over the past few days. Hand exercises given to help with muscle pump for the edema.   Changed recommendation of DC after learning he was in a total independent apartment and is unable to functional at that level at this time. Feel pt will benefit from SNF with rehab in hopes to regain some independence as before.     Follow Up Recommendations  SNF     Equipment Recommendations  None recommended by PT    Recommendations for Other Services       Precautions / Restrictions Precautions Precautions: Fall Restrictions Weight Bearing Restrictions: No    Mobility  Bed Mobility Overal bed mobility: +2 for physical assistance;Needs Assistance Bed Mobility: Supine to Sit     Supine to sit: Max assist;HOB elevated     General bed mobility comments: HOB elevated about 30 degrees, pt stated to let him come to EOB, however pt able to only move R LE to EOB, needed assist with LLE , and upper body as well as scooting hips to EOB. Pt did asssit some, but Max A still . Pt was worried about falling.    Transfers Overall transfer level: Needs assistance Equipment used: None Transfers: Lateral/Scoot Transfers          Lateral/Scoot Transfers: Max assist;+2 physical assistance General transfer comment: Pt stated he pivots to his WC in his apartmetn adn wants to try it, however with how I was standing to offer assistance, he was bothered by me being in the way. He did attempt and was unable to do any kind of lift off to attempt pivot. Then we used a drop arm recliner , and scooted MAx A 2 person level surface from bed to recliner .   Ambulation/Gait                 Stairs            Wheelchair Mobility    Modified Rankin (Stroke Patients Only)       Balance Overall balance assessment: Needs assistance (sitting EOB was with posteriror lean with attempt to scoot to EOB, and very kyphotic posture at EOB. )                                  Cognition Arousal/Alertness: Awake/alert Behavior During Therapy: Anxious (again, pt today did want to perfomr things his way and bothered and worried about lines and tubes. Continued to reassure and asist patient as needed. ) Overall Cognitive Status: Within Functional  Limits for tasks assessed                      Exercises      General Comments General comments (skin integrity, edema, etc.): per nursing and patient great increase in B UE edema and made it more difficult for patient to assist himslef with UEs due to the swelling.       Pertinent Vitals/Pain Pain Assessment: No/denies pain (excpet with swelling in B UES increasing, pt states "it feels like  my hands are going to bust open" )    Home Living                      Prior Function            PT Goals (current goals can now be found in the care plan section) Progress towards PT goals: Progressing toward goals    Frequency  Min 3X/week    PT Plan Discharge plan needs to be updated    Co-evaluation             End of  Session Equipment Utilized During Treatment: Oxygen Activity Tolerance: Patient tolerated treatment well Patient left: in chair;with chair alarm set;with call bell/phone within reach;with nursing/sitter in room (also cushion in recliner )     Time: [id number] PT Time Calculation (min) (ACUTE ONLY): 33 min  Charges:  $Therapeutic Activity: 23-37 mins                    G CodesClide Dales 2015/09/17, 7:22 PM  Clide Dales, PT Pager: [phone] 09/17/15

## 2015-09-05 NOTE — Progress Notes (Signed)
PROGRESS NOTE  Jack Cantrell [id number] DOB: November 14, 1914 DOA: 08/30/2015 PCP: Kirk Ruths., MD Outpatient Specialists:    LOS: 6 days   Subjective: Seen with son at bedside, questions answered. Looks deconditioned, PT to reevaluate might need SNF, he is lives in independent living facility.  Brief Narrative: 82 year old male with a past medical history of a hiatal hernia, gout, nutcracker esophagus; who presents from the [facility] facility with a 4 day history of coughing. Appearing to be dehydrated and confused.   Interim summary: Patient admitted with dysphagia and 4/6 as well as aspiration pneumonia, gastric neurology was consulted and patient underwent an EGD on 4/7 (as below). Following the EGD patient's diet was advanced, he was monitored, and underwent an esophagogram on 4/10, per GI no need for repeat EGD. 4/10-4/7 overnight, patient with increased fever curve (Tmax of 100), and 4/11 AM he was found to have worsening leukocytosis up to 19  Assessment & Plan: Principal Problem:   Sepsis ([place]) Active Problems:   Pneumonia   Pressure ulcer   BPH (benign prostatic hypertrophy)   Nutcracker esophagus   Dysphagia - This problem has been chronic, history of nutcracker esophagus and strictures. Last endoscopy several years ago. - Gastroenterology was consulted, and patient underwent an EGD 4/7 which showed food stuck in the esophagus status post removal, with local irritation and erythema as well as esophageal stenosis which was not dilated due to friable appearance. - management per GI, esophagogram 4/10 with severe dysmotility without any specific dominant esophageal stricture  - GI recommended hyoscyamine, and to hold on esophageal dilatation.  Fever/increased leukocytosis - Repeat chest x-ray this morning showed slight interval increase of air space opacity in the left lung - Clinically he looks well, continue current antibiotic regimen for aspiration  pneumonia - Obtain urinalysis to evaluate for UTI - Continue to monitor  Sepsis likely due to aspiration pneumonia  - On admission, sepsis physiology resolved, continue Unasyn - increased WBC, unclear etiology now, continue antibiotics  BPH (benign prostatic hypertrophy)  HOH  Dementia - continue Aricept  Knee and arms swelling - chronic, bilateral, patient endorses history of gout, resumed his home allopurinol, give colchicine for few days - XR negative for fractures, has swelling -Keep arms elevated and provide Lasix.  Hypokalemia Repleted with oral supplements, check BMP in a.m.   DVT prophylaxis: SCDs Code Status: DO NOT RESUSCITATE Family Communication: d/w son Nadara Mustard bedside Disposition Plan: ILF when ready Barriers for discharge: GI workup  Consultants:   Gastroenterology  Procedures:   EGD 4/7  Antimicrobials:  Unasyn    Objective: Filed Vitals:   09/05/15 0457 09/05/15 1006 09/05/15 1409 09/05/15 1517  BP: 130/59 140/64 161/75 158/71  Pulse: 73 73 90   Temp: 98.4 F (36.9 C) 98.1 F (36.7 C) 98.2 F (36.8 C)   TempSrc: Axillary Oral Axillary   Resp: 26 20 21    Height:      Weight:      SpO2: 97% 95% 95%     Intake/Output Summary (Last 24 hours) at 09/05/15 1544 Last data filed at 09/05/15 1241  Gross per 24 hour  Intake    720 ml  Output    750 ml  Net    -30 ml   Filed Weights   08/31/15 0100  Weight: 61.508 kg (135 lb 9.6 oz)   Examination: Constitutional: NAD, calm, comfortable, confused  Filed Vitals:   09/05/15 0457 09/05/15 1006 09/05/15 1409 09/05/15 1517  BP: 130/59 140/64 161/75 158/71  Pulse: 73 73 90   Temp: 98.4 F (36.9 C) 98.1 F (36.7 C) 98.2 F (36.8 C)   TempSrc: Axillary Oral Axillary   Resp: 26 20 21    Height:      Weight:      SpO2: 97% 95% 95%   ENMT: Mucous membranes are moist.  Respiratory: clear to auscultation bilaterally, no wheezing, no crackles.  Cardiovascular: Regular rate and rhythm, no  murmurs / rubs / gallops.  Abdomen: no tenderness, no masses palpated. Bowel sounds positive.  Musculoskeletal: no clubbing / cyanosis. Bilateral knee swelling, L > R, no erythema, minimally tender to palpation  Neurologic: non focal  Psychiatric: AxO to person    Data Reviewed: I have personally reviewed following labs and imaging studies  CBC:  Recent Labs Lab 08/29/15 2025 08/30/15 1533 09/01/15 0557 09/02/15 0541 09/04/15 0529 09/05/15 0515  WBC 17.4* 20.1* 9.4 11.7* 19.8* 12.1*  NEUTROABS 15.9* 18.8*  --   --   --   --   HGB 13.1 14.3 10.9* 11.1* 10.9* 10.4*  HCT 40.3 42.9 33.9* 33.1* 32.5* 31.7*  MCV 88.8 89.6 92.4 89.0 88.1 88.5  PLT 209 243 177 172 173 0000000   Basic Metabolic Panel:  Recent Labs Lab 08/30/15 1533 09/01/15 0557 09/02/15 0541 09/04/15 0529 09/05/15 0515  NA 140 143 143 140 141  K 4.0 3.2* 3.0* 3.2* 3.0*  CL 106 113* 109 106 103  CO2 25 22 28 25 29   GLUCOSE 133* 74 118* 112* 100*  BUN 19 19 12 11 15   CREATININE 0.82 0.72 0.73 0.63 0.63  CALCIUM 9.7 8.8* 8.9 8.6* 8.7*   GFR: Estimated Creatinine Clearance: 40.5 mL/min (by C-G formula based on Cr of 0.63). Liver Function Tests:  Recent Labs Lab 08/29/15 2025 08/30/15 1533 09/02/15 0541 09/04/15 0529  AST 24 23 17 15   ALT 12* 13* 11* 11*  ALKPHOS 78 84 60 52  BILITOT 1.0 1.4* 0.9 0.6  PROT 6.4* 7.0 5.5* 5.3*  ALBUMIN 3.6 4.0 3.1* 2.5*    Recent Labs Lab 08/29/15 2035  LIPASE 23   No results for input(s): AMMONIA in the last 168 hours. Coagulation Profile: No results for input(s): INR, PROTIME in the last 168 hours. Cardiac Enzymes: No results for input(s): CKTOTAL, CKMB, CKMBINDEX, TROPONINI in the last 168 hours. BNP (last 3 results) No results for input(s): PROBNP in the last 8760 hours. HbA1C: No results for input(s): HGBA1C in the last 72 hours. CBG: No results for input(s): GLUCAP in the last 168 hours. Lipid Profile: No results for input(s): CHOL, HDL, LDLCALC,  TRIG, CHOLHDL, LDLDIRECT in the last 72 hours. Thyroid Function Tests: No results for input(s): TSH, T4TOTAL, FREET4, T3FREE, THYROIDAB in the last 72 hours. Anemia Panel: No results for input(s): VITAMINB12, FOLATE, FERRITIN, TIBC, IRON, RETICCTPCT in the last 72 hours. Urine analysis:    Component Value Date/Time   COLORURINE YELLOW 09/04/2015 [place] 09/04/2015 0939   LABSPEC 1.030 09/04/2015 0939   PHURINE 6.0 09/04/2015 0939   GLUCOSEU NEGATIVE 09/04/2015 0939   HGBUR NEGATIVE 09/04/2015 0939   BILIRUBINUR NEGATIVE 09/04/2015 0939   KETONESUR NEGATIVE 09/04/2015 0939   PROTEINUR 30* 09/04/2015 0939   NITRITE NEGATIVE 09/04/2015 0939   LEUKOCYTESUR NEGATIVE 09/04/2015 0939   Sepsis Labs: Invalid input(s): PROCALCITONIN, LACTICIDVEN  Recent Results (from the past 240 hour(s))  Blood Culture (routine x 2)     Status: None   Collection Time: 08/30/15  3:37 PM  Result Value Ref Range  Status   Specimen Description BLOOD LEFT ANTECUBITAL  Final   Special Requests BOTTLES DRAWN AEROBIC AND ANAEROBIC 5CC  Final   Culture   Final    NO GROWTH 5 DAYS Performed at [facility]    Report Status 09/04/2015 FINAL  Final  Blood Culture (routine x 2)     Status: None   Collection Time: 08/30/15  3:38 PM  Result Value Ref Range Status   Specimen Description BLOOD RIGHT ANTECUBITAL  Final   Special Requests BOTTLES DRAWN AEROBIC AND ANAEROBIC 5CC  Final   Culture   Final    NO GROWTH 5 DAYS Performed at [facility]    Report Status 09/04/2015 FINAL  Final  Urine culture     Status: None   Collection Time: 08/30/15  5:24 PM  Result Value Ref Range Status   Specimen Description URINE, CLEAN CATCH  Final   Special Requests NONE  Final   Culture   Final    NO GROWTH 1 DAY Performed at [facility]    Report Status 08/31/2015 FINAL  Final  MRSA PCR Screening     Status: None   Collection Time: 08/30/15  9:16 PM  Result Value Ref Range  Status   MRSA by PCR NEGATIVE NEGATIVE Final    Comment:        The GeneXpert MRSA Assay (FDA approved for NASAL specimens only), is one component of a comprehensive MRSA colonization surveillance program. It is not intended to diagnose MRSA infection nor to guide or monitor treatment for MRSA infections.     Radiology Studies: Dg Chest Port 1 View  09/04/2015  CLINICAL DATA:  Onset of shortness of breath today, no other complaints, history of pneumonia, former smoker. EXAM: PORTABLE CHEST 1 VIEW COMPARISON:  Portable chest x-ray of August 31, 2015 FINDINGS: Chronic elevation of the left hemidiaphragm is again observed. Interstitial density in the inferior aspect of the aerated portion of the left lung is slightly more conspicuous today. The right lung is adequately inflated. The interstitial markings are coarse though stable. There is stable mild blunting of the right lateral costophrenic angle. The cardiac silhouette is enlarged but its left border indistinct. There is multilevel degenerative disc disease of the thoracic spine. IMPRESSION: Slight interval increase in airspace opacity in the inferior aspect of the left lung. This is accentuated by persistent elevation of the left hemidiaphragm. Stable chronic bronchitic changes in the right lung. Stable cardiomegaly without pulmonary vascular congestion. Electronically Signed   By: David  Martinique M.D.   On: 09/04/2015 07:49   Scheduled Meds: . allopurinol  300 mg Oral Daily  . ampicillin-sulbactam (UNASYN) IV  3 g Intravenous Q6H  . antiseptic oral rinse  7 mL Mouth Rinse BID  . colchicine  0.6 mg Oral Daily  . hyoscyamine  0.375 mg Oral BID  . ondansetron (ZOFRAN) IV  4 mg Intravenous 4 times per day  . pantoprazole  40 mg Oral BID  . sucralfate  1 g Oral TID WC & HS   Continuous Infusions:   Marzetta Board, MD, PhD [facility] Pager [phone]  If 7PM-7AM, please contact night-coverage [email or web address] Password  [facility] 09/05/2015, 3:44 PM

## 2015-09-05 NOTE — Evaluation (Addendum)
Clinical/Bedside Swallow Evaluation Patient Details  Name: Jack Cantrell MRN: [id number] Date of Birth: 1915-05-02  Today's Date: 09/05/2015 Time: SLP Start Time (ACUTE ONLY): 1208 SLP Stop Time (ACUTE ONLY): 1235 SLP Time Calculation (min) (ACUTE ONLY): 27 min  Past Medical History:  Past Medical History  Diagnosis Date  . Esophageal abnormality   . Gout   . Cancer [facility])    Past Surgical History:  Past Surgical History  Procedure Laterality Date  . Joint replacement      Hip  . Esophagogastroduodenoscopy (egd) with propofol N/A 08/31/2015    Procedure: ESOPHAGOGASTRODUODENOSCOPY (EGD) WITH PROPOFOL;  Surgeon: Wonda Horner, MD;  Location: [facility] ENDOSCOPY;  Service: Endoscopy;  Laterality: N/A;   HPI:  82 year old male resident of facility with a past medical history of a hiatal hernia, gout, nutcracker esophagus - 4 day h/o coughing prompting admit on 09/02/15.  Pt had endoscopy 08/31/15 and food was lodged in esophagus requiring removal, dilatation not completed due to risks.   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 GI recommended SLP evaluation to assist with mitigation strategies/diet,etc.      SLP received call from RN stating son requesting information re: pt's swallow evaluation information.  Advised RN reorder needed as pt dc from caseload yesterday.    Assessment / Plan / Recommendation Clinical Impression  Note, pt reported to this SLP that any healthcare worker during this hospital coarse may speak to his son Jack Cantrell regarding his care.  SLP reviewed/confirmed this information in front of pt and son.  Pt continues with appearance of decreased right facial/labial movement, symmetry = he reports uncertain if baseline. Pt denies neurological hx.   SLP observed pt being fed by student RN with his lunch time meal.  Productive cough x1 to food noted after 1/2  spaghetti bowl consumed - RN provided thin tea to facilitate clearance. Reviewed with son Jack Cantrell and pt findings of evaluation and recommendations.  Reviewed chronicity of aspiration risk and mitigation strategies with son and pt.    Son expressed concern witth pt being able to follow through with his new swallow strategies and SLP agrees - therefore recommend follow up at [facility].    SLP continues to recommend dys3/ground meat/thin with strict precautions to mitigate aspiration/esophageal risk.    SLP reviewed items that may clear better with his dysmotiilty including warmer liquids and avoiding hard/tough foods.  Advised son/pt to encourage pt to eat/consume several small meals/day especially if he becomes symptomatic during a meal.    Recommend consider liquid nutritional supplement to aid nutrition.  Recommend SNF for dysphagia management to help pt generalize compensation strategies for maximal safety with po.     Aspiration Risk  Moderate aspiration risk;Severe aspiration risk    Diet Recommendation Dysphagia 2 (Fine chop);Thin liquid   Liquid Administration via: Cup;Straw Medication Administration: Whole meds with puree Supervision: Full supervision/cueing for compensatory strategies;Staff to assist with self feeding Compensations: Slow rate;Small sips/bites;Follow solids with liquid (start meals with liquids, cough and expectorate prn, cease intake if decreased clearance)    Other  Recommendations Recommended Consults: Other (Comment) (OT for self feeding, arm weakness/edema) Oral Care Recommendations: Oral care BID   Follow up Recommendations  Skilled Nursing facility    Frequency and Duration     follow up at SNF       Prognosis Prognosis for Safe Diet Advancement: Guarded Barriers to Reach Goals: Cognitive  deficits;Time post onset;Severity of deficits      Swallow Study   General Date of Onset: 09/04/15 HPI: 82 year old male resident of facility with a past medical  history of a hiatal hernia, gout, nutcracker esophagus - 4 day h/o coughing prompting admit on 09/02/15.  Pt had endoscopy 08/31/15 and food was lodged in esophagus requiring removal, dilatation not completed due to risks.   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 GI recommended SLP evaluation to assist with mitigation strategies/diet,etc.      SLP received call from RN stating son requesting information re: pt's swallow evaluation information.  Advised RN reorder needed as pt dc from caseload yesterday.  Type of Study: Bedside Swallow Evaluation Previous Swallow Assessment: see hhx Diet Prior to this Study: Dysphagia 3 (soft);Thin liquids Respiratory Status: Room air History of Recent Intubation: No Behavior/Cognition: Alert;Cooperative;Pleasant mood Oral Care Completed by SLP: No Oral Cavity - Dentition: Adequate natural dentition Vision: Functional for self-feeding Self-Feeding Abilities: Total assist Patient Positioning: Upright in bed Baseline Vocal Quality: Normal Volitional Cough: Strong Volitional Swallow: Unable to elicit    Oral/Motor/Sensory Function Overall Oral Motor/Sensory Function:  (? decreased right facial movement, symmetry)   Ice Chips Ice chips: Not tested   Thin Liquid Thin Liquid: Within functional limits Presentation: Straw Other Comments: single small boluses tolerated    Nectar Thick Nectar Thick Liquid: Not tested   Honey Thick Honey Thick Liquid: Not tested   Puree Puree: Not tested   Solid   GO   Solid: Impaired Presentation: Self Fed Oral Phase Impairments: Impaired mastication (slow but effective mastication) Oral Phase Functional Implications: Prolonged oral transit;Impaired mastication Pharyngeal Phase Impairments: Multiple swallows;Other (comments) Other Comments: sensation of residuals in pharynx, cough x1 noted after  intake of approximately 1/2 cup of spaghetti-productive to small amount of food - nursing student offered thin tea to faciliate clearance        Luanna Salk, [facility] SLP [phone]

## 2015-09-06 LAB — BASIC METABOLIC PANEL
ANION GAP: 9 (ref 5–15)
BUN: 16 mg/dL (ref 6–20)
CALCIUM: 8.7 mg/dL — AB (ref 8.9–10.3)
CO2: 31 mmol/L (ref 22–32)
CREATININE: 0.76 mg/dL (ref 0.61–1.24)
Chloride: 99 mmol/L — ABNORMAL LOW (ref 101–111)
GLUCOSE: 96 mg/dL (ref 65–99)
Potassium: 3.3 mmol/L — ABNORMAL LOW (ref 3.5–5.1)
Sodium: 139 mmol/L (ref 135–145)

## 2015-09-06 MED ORDER — ENSURE ENLIVE PO LIQD: 237.0000 mL | Freq: Two times a day (BID) | ORAL | Status: DC

## 2015-09-06 MED ORDER — MAGNESIUM SULFATE 2 GM/50ML IV SOLN
2.0000 g | Freq: Once | INTRAVENOUS | Status: AC
  Administered 2015-09-06: 2 g via INTRAVENOUS
  Filled 2015-09-06: qty 50

## 2015-09-06 MED ORDER — POTASSIUM CHLORIDE CRYS ER 20 MEQ PO TBCR
60.0000 meq | EXTENDED_RELEASE_TABLET | Freq: Four times a day (QID) | ORAL | Status: AC
  Administered 2015-09-06 (×2): 60 meq via ORAL
  Filled 2015-09-06 (×2): qty 3

## 2015-09-06 MED ORDER — FUROSEMIDE 10 MG/ML IJ SOLN
40.0000 mg | Freq: Once | INTRAMUSCULAR | Status: AC
  Administered 2015-09-06: 40 mg via INTRAVENOUS
  Filled 2015-09-06: qty 4

## 2015-09-06 MED ORDER — GUAIFENESIN ER 600 MG PO TB12
600.0000 mg | ORAL_TABLET | Freq: Two times a day (BID) | ORAL | Status: DC
  Administered 2015-09-06 – 2015-09-07 (×3): 600 mg via ORAL
  Filled 2015-09-06 (×4): qty 1

## 2015-09-06 MED ORDER — LIP MEDEX EX OINT
TOPICAL_OINTMENT | CUTANEOUS | Status: AC
  Administered 2015-09-06: 19:00:00
  Filled 2015-09-06: qty 7

## 2015-09-06 NOTE — Evaluation (Signed)
Occupational Therapy Evaluation Patient Details Name: Jack Cantrell MRN: [id number] DOB: [birth year] Today's Date: 09/06/2015    History of Present Illness Pt admitted with coughing, dehydration and altered mental status. Found to have esophageal impaction. Lives in independent living performing transfers to /from bed to [facility] and toilet independently per patient.    Clinical Impression   This 82 year old man was admitted for the above.  He will benefit from skilled OT to increase safety and independence with adls.  Will address UE strength and edema management in acute setting. Goals are for min to mod A in acute.  He needs up to max +2 assistance for bed mobility and total +2 A for LB adls at this time    Follow Up Recommendations  SNF (vs assistance for adls and mobility at ALF)    Equipment Recommendations  3 in 1 bedside comode (drop arm)    Recommendations for Other Services       Precautions / Restrictions Precautions Precautions: Fall Restrictions Weight Bearing Restrictions: No      Mobility Bed Mobility   Bed Mobility: Rolling Rolling: Max assist         General bed mobility comments: assist to bend leg and roll.  Pt held to rail with LUE  Transfers                 General transfer comment: not tested this session    Balance                                            ADL Overall ADL's : Needs assistance/impaired Eating/Feeding: Set up;Bed level   Grooming: Oral care;Wash/dry face;Minimal assistance;Bed level   Upper Body Bathing: Moderate assistance;Bed level   Lower Body Bathing: Total assistance;+2 for physical assistance   Upper Body Dressing : Moderate assistance;Bed level   Lower Body Dressing: Total assistance;+2 for physical assistance;Bed level       Toileting- Clothing Manipulation and Hygiene: Total assistance;+2 for physical assistance;Bed level         General ADL Comments: pt washed face and brushed  teeth with min A.  When rolling, found that bed was saturated in urine despite catheter.  Called for assistance and NT/SN came in to help.  Assisted pt with cleaning up and changing gown. Nursing changed catheter.  I returned to position UEs and hung sign for edema management     Vision     Perception     Praxis      Pertinent Vitals/Pain Pain Assessment: No/denies pain (until nursing came to remove/replace catheter)     Hand Dominance     Extremity/Trunk Assessment Upper Extremity Assessment Upper Extremity Assessment: RUE deficits/detail;LUE deficits/detail RUE Deficits / Details: difficulty lifting R shoulder, AROM approximately 30. Able to make a fist.  Minimal swelling LUE Deficits / Details: able to use for adls, reaching to 90; brushing teeth and washing face.  swelling moreso in forearm and elbow.  Pt tends to use LUE more but says he is really R handed           Communication Communication Communication:  (wears hearing aides)   Cognition Arousal/Alertness: Awake/alert Behavior During Therapy: WFL for tasks assessed/performed Overall Cognitive Status: Within Functional Limits for tasks assessed                     General  Comments       Exercises       Shoulder Instructions      Home Living                                   Additional Comments: may need SNF; from [place]      Prior Functioning/Environment          Comments: unsure of ADL assistance prior to admission; pt states he cares for himself--unable to verify    OT Diagnosis: Generalized weakness   OT Problem List: Decreased strength;Decreased activity tolerance;Decreased knowledge of use of DME or AE;Impaired UE functional use;Increased edema (balance NT)   OT Treatment/Interventions: Self-care/ADL training;DME and/or AE instruction;Therapeutic activities;Balance training;Patient/family education;Therapeutic exercise    OT Goals(Current goals can be found in the  care plan section) Acute Rehab OT Goals Patient Stated Goal: to return to his assisted living OT Goal Formulation: With patient Time For Goal Achievement: 09/20/15 Potential to Achieve Goals: Fair ADL Goals Pt Will Transfer to Toilet: with mod assist;squat pivot transfer;bedside commode (vs lateral with drop arm commode) Additional ADL Goal #1: pt will perform UB adls with min A supported sitting or HOB raised Additional ADL Goal #2: pt will perform bed mobility at mod A level in preparation for adls/toilet transfers Additional ADL Goal #3: pt will strengthening RUE to 60 degrees AROM and use for adls  OT Frequency: Min 2X/week   Barriers to D/C:            Co-evaluation              End of Session Nurse Communication:  (edema management; catheter leaking)  Activity Tolerance: Patient limited by fatigue Patient left: in bed;with call bell/phone within reach;with bed alarm set;with nursing/sitter in room   Time: [id number] OT Time Calculation (min): 32 min Charges:  OT General Charges $OT Visit: 1 Procedure OT Evaluation $OT Eval Low Complexity: 1 Procedure OT Treatments $Self Care/Home Management : 8-22 mins G-Codes:    Hernandez Losasso 08-Sep-2015, 4:22 PM  Lesle Chris, OTR/L [phone] Sep 08, 2015

## 2015-09-06 NOTE — NC FL2 (Signed)
[place] LEVEL OF CARE SCREENING TOOL     IDENTIFICATION  Patient Name: Jack Cantrell Birthdate: 1914-11-09 Sex: male Admission Date (Current Location): 08/30/2015  [facility] and [place] Number:  Herbalist and Address:  [facility],  [place] [address], [place]      Provider Number: [id number]  Attending Physician Name and Address:  Verlee Monte, MD  Relative Name and Phone Number:       Current Level of Care: Hospital Recommended Level of Care: [place] Prior Approval Number:    Date Approved/Denied:   PASRR Number: [id number]  Discharge Plan: SNF    Current Diagnoses: Patient Active Problem List   Diagnosis Date Noted  . Pressure ulcer 08/31/2015  . BPH (benign prostatic hypertrophy) 08/31/2015  . 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 Sepsis ([place]) 08/31/2015  . Pneumonia 08/30/2015  . CAP (community acquired pneumonia) 08/30/2015    Orientation RESPIRATION BLADDER Height & Weight     Self, Time, Situation, Place  O2 (2L) Incontinent Weight: 61.508 kg (135 lb 9.6 oz) Height:  5\' 3"  (160 cm)  BEHAVIORAL SYMPTOMS/MOOD NEUROLOGICAL BOWEL NUTRITION STATUS   (NONE)  (NONE) Incontinent Diet (DYS II)  AMBULATORY STATUS COMMUNICATION OF NEEDS Skin   Extensive Assist Verbally PU Stage and Appropriate Care (Stage I of sacrum.) PU Stage 1 Dressing:  (PRN foam dressing)                     Personal Care Assistance Level of Assistance  Bathing, Feeding, Dressing Bathing Assistance: Limited assistance Feeding assistance: Independent Dressing Assistance: Limited assistance     Functional Limitations Info  Sight, Hearing, Speech Sight Info: Impaired Hearing Info: Impaired Speech Info: Adequate    SPECIAL CARE FACTORS FREQUENCY  PT (By licensed PT), OT (By licensed OT)     PT Frequency: 5/week OT Frequency: 5/week            Contractures Contractures Info: Not present    Additional Factors Info   Code Status, Allergies Code Status Info: DNR Allergies Info: NKDA           Current Medications (09/06/2015):  This is the current hospital active medication list Current Facility-Administered Medications  Medication Dose Route Frequency Provider Last Rate Last Dose  . allopurinol (ZYLOPRIM) tablet 300 mg  300 mg Oral Daily Caren Griffins, MD   300 mg at 09/06/15 0915  . alum & mag hydroxide-simeth (MAALOX/MYLANTA) 200-200-20 MG/5ML suspension 15 mL  15 mL Oral Q6H PRN Costin Karlyne Greenspan, MD      . Ampicillin-Sulbactam (UNASYN) 3 g in sodium chloride 0.9 % 100 mL IVPB  3 g Intravenous Q6H Delfina Redwood, MD   3 g at 09/06/15 1109  . antiseptic oral rinse (CPC / CETYLPYRIDINIUM CHLORIDE 0.05%) solution 7 mL  7 mL Mouth Rinse BID Norval Morton, MD   7 mL at 09/06/15 1242  . colchicine tablet 0.6 mg  0.6 mg Oral Daily Caren Griffins, MD   0.6 mg at 09/06/15 0915  . feeding supplement (ENSURE ENLIVE) (ENSURE ENLIVE) liquid 237 mL  237 mL Oral BID BM Verlee Monte, MD   237 mL at 09/06/15 1242  . fluticasone (FLONASE) 50 MCG/ACT nasal spray 1 spray  1 spray Each Nare Daily PRN Norval Morton, MD      . guaiFENesin (MUCINEX) 12 hr tablet 600 mg  600 mg Oral BID Verlee Monte, MD      . hyoscyamine (  LEVBID) 0.375 MG 12 hr tablet 0.375 mg  0.375 mg Oral BID Clarene Essex, MD   0.375 mg at 09/06/15 0915  . ipratropium (ATROVENT) nebulizer solution 0.5 mg  0.5 mg Nebulization Q4H PRN Norval Morton, MD      . levalbuterol (XOPENEX) nebulizer solution 0.63 mg  0.63 mg Nebulization Q4H PRN Norval Morton, MD      . ondansetron (ZOFRAN) injection 4 mg  4 mg Intravenous 4 times per day Norval Morton, MD   4 mg at 09/06/15 1241  . pantoprazole (PROTONIX) EC tablet 40 mg  40 mg Oral BID Minda Ditto, RPH   40 mg at 09/06/15 0915  . potassium chloride SA (K-DUR,KLOR-CON) CR tablet 60 mEq  60 mEq Oral Q6H Verlee Monte, MD   60 mEq at 09/06/15 0916  . sucralfate (CARAFATE) 1 GM/10ML suspension 1 g  1  g Oral TID WC & HS Costin Karlyne Greenspan, MD   1 g at 09/06/15 1241     Discharge Medications: Please see discharge summary for a list of discharge medications.  Relevant Imaging Results:  Relevant Lab Results:   Additional Information SSN: [id number]  Rigoberto Noel, LCSW

## 2015-09-06 NOTE — Progress Notes (Signed)
PROGRESS NOTE  Jack Cantrell [id number] DOB: 1915/03/06 DOA: 08/30/2015 PCP: Kirk Ruths., MD Outpatient Specialists:    LOS: 7 days   Subjective: Seen with son at bedside, questions answered. Patient sitting complains about his arm swelling, I have asked him to keep it elevated and given 1 dose of Lasix  Brief Narrative: 82 year old male with a past medical history of a hiatal hernia, gout, nutcracker esophagus; who presents from the [facility] facility with a 4 day history of coughing. Appearing to be dehydrated and confused.   Interim summary: Patient admitted with dysphagia and 4/6 as well as aspiration pneumonia, gastric neurology was consulted and patient underwent an EGD on 4/7 (as below). Following the EGD patient's diet was advanced, he was monitored, and underwent an esophagogram on 4/10, per GI no need for repeat EGD. 4/10-4/7 overnight, patient with increased fever curve (Tmax of 100), and 4/11 AM he was found to have worsening leukocytosis up to 19  Assessment & Plan: Principal Problem:   Sepsis ([place]) Active Problems:   Pneumonia   Pressure ulcer   BPH (benign prostatic hypertrophy)   Nutcracker esophagus   Dysphagia - This problem has been chronic, history of nutcracker esophagus and strictures. Last endoscopy several years ago. - Gastroenterology was consulted, and patient underwent an EGD 4/7 which showed food stuck in the esophagus status post removal, with local irritation and erythema as well as esophageal stenosis which was not dilated due to friable appearance. - management per GI, esophagogram 4/10 with severe dysmotility without any specific dominant esophageal stricture  - GI recommended hyoscyamine, and to hold on esophageal dilatation. -Patient tolerates his diet okay without any further complaints.  Fever/increased leukocytosis - Repeat chest x-ray this morning showed slight interval increase of air space opacity in the left  lung - Low-grade fever of 100 on 4/10, no fever since then, antibiotics continued for pneumonia. UA negative.  Sepsis likely due to aspiration pneumonia  - On admission, sepsis physiology resolved, continue Unasyn - increased WBC, unclear etiology now, continue antibiotics  BPH (benign prostatic hypertrophy)  HOH  Dementia - continue Aricept  Knee and arms swelling - chronic, bilateral, patient endorses history of gout, resumed his home allopurinol, give colchicine for few days - XR negative for fractures, has swelling -Keep arms elevated and provide Lasix.  Hypokalemia Repeat potassium supplements, give magnesium check BMP and magnesium in a.m.   DVT prophylaxis: SCDs Code Status: DO NOT RESUSCITATE Family Communication: d/w son Nadara Mustard bedside Disposition Plan: ILF when ready Barriers for discharge: GI workup  Consultants:   Gastroenterology  Procedures:   EGD 4/7  Antimicrobials:  Unasyn    Objective: Filed Vitals:   09/05/15 2148 09/06/15 0527 09/06/15 0847 09/06/15 1332  BP: 146/82 152/82 140/72 130/73  Pulse: 73 75 69 73  Temp: 98.2 F (36.8 C) 97.8 F (36.6 C) 98 F (36.7 C) 97.8 F (36.6 C)  TempSrc: Axillary Axillary Axillary Axillary  Resp: 20 20 25 23   Height:      Weight:      SpO2: 98% 99% 96% 95%    Intake/Output Summary (Last 24 hours) at 09/06/15 1437 Last data filed at 09/06/15 1436  Gross per 24 hour  Intake    780 ml  Output   3725 ml  Net  -2945 ml   Filed Weights   08/31/15 0100  Weight: 61.508 kg (135 lb 9.6 oz)   Examination: Constitutional: NAD, calm, comfortable, confused  Filed Vitals:   09/05/15 2148 09/06/15  [id number] 09/06/15 0847 09/06/15 1332  BP: 146/82 152/82 140/72 130/73  Pulse: 73 75 69 73  Temp: 98.2 F (36.8 C) 97.8 F (36.6 C) 98 F (36.7 C) 97.8 F (36.6 C)  TempSrc: Axillary Axillary Axillary Axillary  Resp: 20 20 25 23   Height:      Weight:      SpO2: 98% 99% 96% 95%  ENMT: Mucous membranes are  moist.  Respiratory: clear to auscultation bilaterally, no wheezing, no crackles.  Cardiovascular: Regular rate and rhythm, no murmurs / rubs / gallops.  Abdomen: no tenderness, no masses palpated. Bowel sounds positive.  Musculoskeletal: no clubbing / cyanosis. Bilateral knee swelling, L > R, no erythema, minimally tender to palpation  Neurologic: non focal  Psychiatric: AxO to person    Data Reviewed: I have personally reviewed following labs and imaging studies  CBC:  Recent Labs Lab 08/30/15 1533 09/01/15 0557 09/02/15 0541 09/04/15 0529 09/05/15 0515  WBC 20.1* 9.4 11.7* 19.8* 12.1*  NEUTROABS 18.8*  --   --   --   --   HGB 14.3 10.9* 11.1* 10.9* 10.4*  HCT 42.9 33.9* 33.1* 32.5* 31.7*  MCV 89.6 92.4 89.0 88.1 88.5  PLT 243 177 172 173 0000000   Basic Metabolic Panel:  Recent Labs Lab 09/01/15 0557 09/02/15 0541 09/04/15 0529 09/05/15 0515 09/06/15 0526  NA 143 143 140 141 139  K 3.2* 3.0* 3.2* 3.0* 3.3*  CL 113* 109 106 103 99*  CO2 22 28 25 29 31   GLUCOSE 74 118* 112* 100* 96  BUN 19 12 11 15 16   CREATININE 0.72 0.73 0.63 0.63 0.76  CALCIUM 8.8* 8.9 8.6* 8.7* 8.7*   GFR: Estimated Creatinine Clearance: 40.5 mL/min (by C-G formula based on Cr of 0.76). Liver Function Tests:  Recent Labs Lab 08/30/15 1533 09/02/15 0541 09/04/15 0529  AST 23 17 15   ALT 13* 11* 11*  ALKPHOS 84 60 52  BILITOT 1.4* 0.9 0.6  PROT 7.0 5.5* 5.3*  ALBUMIN 4.0 3.1* 2.5*   No results for input(s): LIPASE, AMYLASE in the last 168 hours. No results for input(s): AMMONIA in the last 168 hours. Coagulation Profile: No results for input(s): INR, PROTIME in the last 168 hours. Cardiac Enzymes: No results for input(s): CKTOTAL, CKMB, CKMBINDEX, TROPONINI in the last 168 hours. BNP (last 3 results) No results for input(s): PROBNP in the last 8760 hours. HbA1C: No results for input(s): HGBA1C in the last 72 hours. CBG: No results for input(s): GLUCAP in the last 168 hours. Lipid  Profile: No results for input(s): CHOL, HDL, LDLCALC, TRIG, CHOLHDL, LDLDIRECT in the last 72 hours. Thyroid Function Tests: No results for input(s): TSH, T4TOTAL, FREET4, T3FREE, THYROIDAB in the last 72 hours. Anemia Panel: No results for input(s): VITAMINB12, FOLATE, FERRITIN, TIBC, IRON, RETICCTPCT in the last 72 hours. Urine analysis:    Component Value Date/Time   COLORURINE YELLOW 09/04/2015 [place] 09/04/2015 0939   LABSPEC 1.030 09/04/2015 0939   PHURINE 6.0 09/04/2015 0939   GLUCOSEU NEGATIVE 09/04/2015 0939   HGBUR NEGATIVE 09/04/2015 0939   BILIRUBINUR NEGATIVE 09/04/2015 0939   KETONESUR NEGATIVE 09/04/2015 0939   PROTEINUR 30* 09/04/2015 0939   NITRITE NEGATIVE 09/04/2015 0939   LEUKOCYTESUR NEGATIVE 09/04/2015 0939   Sepsis Labs: Invalid input(s): PROCALCITONIN, LACTICIDVEN  Recent Results (from the past 240 hour(s))  Blood Culture (routine x 2)     Status: None   Collection Time: 08/30/15  3:37 PM  Result Value Ref Range Status  Specimen Description BLOOD LEFT ANTECUBITAL  Final   Special Requests BOTTLES DRAWN AEROBIC AND ANAEROBIC 5CC  Final   Culture   Final    NO GROWTH 5 DAYS Performed at [facility]    Report Status 09/04/2015 FINAL  Final  Blood Culture (routine x 2)     Status: None   Collection Time: 08/30/15  3:38 PM  Result Value Ref Range Status   Specimen Description BLOOD RIGHT ANTECUBITAL  Final   Special Requests BOTTLES DRAWN AEROBIC AND ANAEROBIC 5CC  Final   Culture   Final    NO GROWTH 5 DAYS Performed at [facility]    Report Status 09/04/2015 FINAL  Final  Urine culture     Status: None   Collection Time: 08/30/15  5:24 PM  Result Value Ref Range Status   Specimen Description URINE, CLEAN CATCH  Final   Special Requests NONE  Final   Culture   Final    NO GROWTH 1 DAY Performed at [facility]    Report Status 08/31/2015 FINAL  Final  MRSA PCR Screening     Status: None   Collection  Time: 08/30/15  9:16 PM  Result Value Ref Range Status   MRSA by PCR NEGATIVE NEGATIVE Final    Comment:        The GeneXpert MRSA Assay (FDA approved for NASAL specimens only), is one component of a comprehensive MRSA colonization surveillance program. It is not intended to diagnose MRSA infection nor to guide or monitor treatment for MRSA infections.     Radiology Studies: No results found. Scheduled Meds: . allopurinol  300 mg Oral Daily  . ampicillin-sulbactam (UNASYN) IV  3 g Intravenous Q6H  . antiseptic oral rinse  7 mL Mouth Rinse BID  . colchicine  0.6 mg Oral Daily  . feeding supplement (ENSURE ENLIVE)  237 mL Oral BID BM  . guaiFENesin  600 mg Oral BID  . hyoscyamine  0.375 mg Oral BID  . ondansetron (ZOFRAN) IV  4 mg Intravenous 4 times per day  . pantoprazole  40 mg Oral BID  . potassium chloride  60 mEq Oral Q6H  . sucralfate  1 g Oral TID WC & HS   Continuous Infusions:   Marzetta Board, MD, PhD [facility] Pager [phone]  If 7PM-7AM, please contact night-coverage [email or web address] Password [facility] 09/06/2015, 2:37 PM

## 2015-09-06 NOTE — Progress Notes (Signed)
Nutrition Follow-up  DOCUMENTATION CODES:   Not applicable  INTERVENTION:  - Will order Ensure Enlive po BID, each supplement provides 350 kcal and 20 grams of protein. - Continue to monitor for SLP recommendations concerning diet and liquids texture/consistency; will downgrade to Dysphagia 2, thin liquids per SLP recommendation 4/12. - Tech/RN to assist during meals and encourage intakes. - RD will continue to monitor for additional needs.  NUTRITION DIAGNOSIS:   Swallowing difficulty related to lethargy/confusion, dysphagia as evidenced by per patient/family report, other (see comment) (RN report). -ongoing  GOAL:   Patient will meet greater than or equal to 90% of their needs -unmet  MONITOR:   PO intake, Supplement acceptance, Weight trends, Labs, Skin, I & O's  ASSESSMENT:   82 year old male with a past medical history of a hiatal hernia, gout, nutcracker esophagus; who presents [facility] with a 4 day history of coughing. appearing to be dehydrated and confused. Was seen in the ED yesterday and thought to have pneumonia for which he was given Levaquin. He only took about 2 doses. Son notes that he is coughing and spitting up anytime he tries to eat or drink anything. Denies vomiting blood. This morning he had red Jell-O around 11 AM, but still appears to be spitting up thick reddish material. Son notes that he has previously been treated for esophageal dysmotility with a dilation in his early 38s and subsequently was advised to eat a repeat dilation for which the family declined due to his age. Putting him under general anesthesia. History of esophageal nutcracker is being currently treated with Hyoscyamine. Due to the patient's frequent coughing he's had decreased overall appetite and generalized weakness. Denies any chest pain, shortness of breath, fever, or diarrhea.  4/13 SLP following pt and last seen yesterday afternoon for follow-up  evaluation with recommendation at that time for Dysphagia 2, thin liquids. Pt currently on Dysphagia 3, thin liquids; will downgrade according to SLP recommendations.  MD note 4/12 @ 1544:  management per GI, esophagogram 4/10 with severe dysmotility without any specific dominant esophageal stricture. GI recommended hyoscyamine, and to hold on esophageal dilatation. Sepsis likely due to aspiration pneumonia    Since previous RD assessment, pt has been consuming ~25% of meals and is not meeting needs. Will order Ensure to supplement and will adjust as needed based on SLP recommendations throughout hospitalization. Please continue to encourage intakes at meals and with supplements. Pt not meeting needs at this time. Medications reviewed; 40 mg IV Lasix x1 on 4/12, 60 mEq oral KCl every 6 hours. Labsr eviewed; K: 3.3 mmol/L, Cl: 99 mmol/L, Ca: 8.7 mg/dL.   4/11 - Pt with hx of dementia and was unable to provide relevant information; no family/visitors at bedside.  - Notes indicate that pt had decreased appetite and weakness PTA.  - Lunch arrived during RD visit. Spoke with RN outside of the room and she reports pt coughed and spit with intakes of thin liquids and eggs this AM but tolerated applesauce without issue.  - She states SLP to evaluate pt (SLP currently working with pt at time of writing this note).  - Physical assessment shows no muscle or fat wasting, mild edema to RUE and RLE and severe edema to LUE.  - Per chart review, pt was 155 lbs on 08/29/15 and 135 lbs on 08/31/15 with no other weight recordings available; will monitor weight trends during admission in light of edema.  - Per MD note 4/10 @ 1155:  Dysphagia - This problem has been chronic, history of nutcracker esophagus and strictures. Last endoscopy several years ago. - Gastroenterology was consulted, and patient underwent an EGD 4/7 which showed food stuck in the esophagus status post removal, with local irritation and erythema as  well as esophageal stenosis which was not dilated due to friable appearance. - management per GI, esophagogram today and will be re-evaluated for repeat EGD - Per GI note 4/10 @ [id number]:  Might need to have speech therapy evaluate to help decide on appropriate diet and  nutrition team as well and can hold off on endoscopy and possible dilation for now but  consider it at some point in the future if symptoms continue and might need to consider  possibly low-dose Reglan or erythromycin as motility agents  Diet Order:  DIET DYS 3 Room service appropriate?: No; Fluid consistency:: Thin  Skin:  Wound (see comment) (Stage 1 sacral pressure injury, R arm wound)  Last BM:  4/8  Height:   Ht Readings from Last 1 Encounters:  08/31/15 5\' 3"  (1.6 m)    Weight:   Wt Readings from Last 1 Encounters:  08/31/15 135 lb 9.6 oz (61.508 kg)    Ideal Body Weight:  56.36 kg (kg)  BMI:  Body mass index is 24.03 kg/(m^2).  Estimated Nutritional Needs:   Kcal:  1230-1415 (20-23 kcal/kg)  Protein:  60-70 grams  Fluid:  >/= 1.5 L/day  EDUCATION NEEDS:   No education needs identified at this time     Jarome Matin, RD, LDN Inpatient Clinical Dietitian Pager # [phone] After hours/weekend pager # [phone]

## 2015-09-07 DIAGNOSIS — F0390 Unspecified dementia without behavioral disturbance: Secondary | ICD-10-CM

## 2015-09-07 LAB — BASIC METABOLIC PANEL
Anion gap: 5 (ref 5–15)
BUN: 18 mg/dL (ref 6–20)
CALCIUM: 8.9 mg/dL (ref 8.9–10.3)
CO2: 30 mmol/L (ref 22–32)
CREATININE: 0.7 mg/dL (ref 0.61–1.24)
Chloride: 101 mmol/L (ref 101–111)
GFR calc non Af Amer: >60 mL/min (ref >60)
Glucose, Bld: 106 mg/dL — ABNORMAL HIGH (ref 65–99)
Potassium: 3.9 mmol/L (ref 3.5–5.1)
SODIUM: 136 mmol/L (ref 135–145)

## 2015-09-07 LAB — MAGNESIUM: MAGNESIUM: 2 mg/dL (ref 1.7–2.4)

## 2015-09-07 MED ORDER — BISACODYL 10 MG RE SUPP
10.0000 mg | Freq: Once | RECTAL | Status: AC
Start: 2015-09-07 — End: 2015-09-07
  Administered 2015-09-07: 10 mg via RECTAL
  Filled 2015-09-07: qty 1

## 2015-09-07 NOTE — Discharge Summary (Signed)
Physician Discharge Summary  Jack Cantrell [id number] DOB: 1914-11-14 DOA: 08/30/2015  PCP: Kirk Ruths., MD  Admit date: 08/30/2015 Discharge date: 09/07/2015  Time spent: 40 minutes  Recommendations for Outpatient Follow-up:  1. Discharge to [facility] skilled nursing facility  2. Received 7 days of Unasyn for treatment of aspiration pneumonia   Discharge Diagnoses:  Principal Problem:   Sepsis ([place]) Active Problems:   Pneumonia   Pressure ulcer   BPH (benign prostatic hypertrophy)   Nutcracker esophagus   Discharge Condition: Stable  Diet recommendation: Dysphagia 2 diet with thin liquids, okay for ground meat and sauces  Filed Weights   08/31/15 0100  Weight: 61.508 kg (135 lb 9.6 oz)    History of present illness:  Jack Cantrell is a 82 year old male with a past medical history of a hiatal hernia, gout, nutcracker esophagus; who presents from the [place] living facility with a 4 day history of coughing. appearing to be dehydrated and confused. Was seen in the ED yesterday and thought to have pneumonia for which he was given Levaquin. He only took about 2 doses. Son notes that he is coughing and spitting up anytime he tries to eat or drink anything. Denies vomiting blood. This morning he had red Jell-O around 11 AM, but still appears to be spitting up thick reddish material. Son notes that he has previously been treated for esophageal dysmotility with a dilation in his early 28s and subsequently was advised to eat a repeat dilation for which the family declined due to his age. Putting him under general anesthesia. History of esophageal nutcracker is being currently treated with Hyoscyamine. Due to the patient's frequent coughing he's had decreased overall appetite and generalized weakness. Denies any chest pain, shortness of breath, fever, or diarrhea.  Upon arrival patient is evaluated and had a repeat chest x-ray which showed worsening bilateral pulmonary  infiltrates. BNP was seen to be slightly elevated at 120.6. Initial lactic acid was 1.7 1 repeat was 2.19.  Hospital Course:    Dysphagia - This problem has been chronic, history of nutcracker esophagus and strictures. Last endoscopy several years ago. - Gastroenterology was consulted, and patient underwent an EGD 4/7 which showed food stuck in the esophagus status post removal, with local irritation and erythema as well as esophageal stenosis which was not dilated due to friable appearance. - management per GI, esophagogram 4/10 with severe dysmotility without any specific dominant esophageal stricture  - GI recommended hyoscyamine, and to hold on esophageal dilatation. -Patient tolerates his diet okay without any further complaints. Continue Levsin.  Fever/increased leukocytosis - Repeat chest x-ray this morning showed slight interval increase of air space opacity in the left lung - These findings are likely secondary to aspiration pneumonia.  Sepsis likely due to aspiration pneumonia  - On admission, sepsis physiology resolved, received Unasyn for 7 days. - No antibiotics on discharge.  BPH (benign prostatic hypertrophy)  HOH  Dementia - continue Aricept  Knee and arms swelling - chronic, bilateral, patient endorses history of gout, resumed his home allopurinol, given colchicine for few days - XR negative for fractures or acute findings. - Arms were swollen, kept elevated and Lasix given his improved.  Hypokalemia Potassium repleted with oral supplements, potassium is 3.9 on discharge.   Procedures:  None  Consultations:  None  Discharge Exam: Filed Vitals:   09/06/15 1332 09/07/15 0423  BP: 130/73 137/51  Pulse: 73 75  Temp: 97.8 F (36.6 C) 98.1 F (36.7 C)  Resp: 23  25   General: Alert and awake, oriented 2, not in any acute distress. HEENT: anicteric sclera, pupils reactive to light and accommodation, EOMI CVS: S1-S2 clear, no murmur rubs or  gallops Chest: clear to auscultation bilaterally, no wheezing, rales or rhonchi Abdomen: soft nontender, nondistended, normal bowel sounds, no organomegaly Extremities: no cyanosis, clubbing or edema noted bilaterally Neuro: Cranial nerves II-XII intact, no focal neurological deficits  Discharge Instructions   Discharge Instructions    Diet - low sodium heart healthy    Complete by:  As directed      Increase activity slowly    Complete by:  As directed           Current Discharge Medication List    CONTINUE these medications which have NOT CHANGED   Details  allopurinol (ZYLOPRIM) 300 MG tablet Take 300 mg by mouth daily.    aspirin EC 81 MG tablet Take 81 mg by mouth daily.    donepezil (ARICEPT) 5 MG tablet Take 5 mg by mouth every evening.    finasteride (PROSCAR) 5 MG tablet Take 5 mg by mouth daily. Refills: 1    hyoscyamine (LEVBID) 0.375 MG 12 hr tablet Take 0.375 mg by mouth 2 (two) times daily. Cramping Refills: 0    loratadine (CLARITIN) 10 MG tablet Take 10 mg by mouth daily.    mometasone (NASONEX) 50 MCG/ACT nasal spray Place 1 spray into the nose daily as needed (cold symptoms).    Multiple Vitamin (MULTIVITAMIN WITH MINERALS) TABS tablet Take 1 tablet by mouth daily.    omeprazole (PRILOSEC) 40 MG capsule Take 40 mg by mouth daily. Refills: 11    Probiotic CAPS Take 1 capsule by mouth daily.      STOP taking these medications     levofloxacin (LEVAQUIN) 750 MG tablet        No Known Allergies Follow-up Information    Follow up with Kirk Ruths., MD In 1 week.   Specialty:  Internal Medicine   Contact information:   [address] [place] [address] [phone]        The results of significant diagnostics from this hospitalization (including imaging, microbiology, ancillary and laboratory) are listed below for reference.    Significant Diagnostic Studies: Dg Chest 2 View  08/30/2015  CLINICAL  DATA:  Nausea, vomiting and cough. EXAM: CHEST  2 VIEW COMPARISON:  None. FINDINGS: Normal heart size. Aortic atherosclerosis is identified. Decreased lung volumes with asymmetric elevation of the left hemidiaphragm. Diffuse opacities are identified in both lungs. Are identified bilaterally which may reflect multifocal pneumonia and or edema. IMPRESSION: 1. Diffuse bilateral pulmonary opacities compatible with edema versus multifocal infection. Electronically Signed   By: Kerby Moors M.D.   On: 08/30/2015 17:14   Dg Chest 2 View  08/29/2015  CLINICAL DATA:  Nausea and vomiting for 2-3 days EXAM: CHEST  2 VIEW COMPARISON:  None. FINDINGS: Cardiac shadow is at the upper limits of normal in size. Elevation of left hemidiaphragm is noted. No focal infiltrate or sizable effusion is seen. No acute bony abnormality is noted. IMPRESSION: No active cardiopulmonary disease. Electronically Signed   By: Inez Catalina M.D.   On: 08/29/2015 21:56   Dg Knee 1-2 Views Left  09/02/2015  CLINICAL DATA:  82 year old with acute onset of left knee pain and swelling which began today. No known injury. EXAM: LEFT KNEE - 1-2 VIEW COMPARISON:  None. FINDINGS: Very large joint effusion. No evidence of acute  or subacute fracture or dislocation. Chondrocalcinosis involving the lateral and medial menisci. Mild to moderate medial compartment joint space narrowing. Patellofemoral and lateral compartment joint spaces well preserved. Mild osseous demineralization. No other intrinsic osseous abnormality. IMPRESSION: 1. No acute or subacute osseous abnormality. 2. CPPD. 3. 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 4. Very large joint effusion. Electronically Signed   By: Evangeline Dakin M.D.   On: 09/02/2015 09:28   Ct Head Wo Contrast  08/29/2015  CLINICAL DATA:  Confusion. EXAM: CT HEAD WITHOUT CONTRAST TECHNIQUE: Contiguous axial images were obtained from the base of the skull through the vertex without intravenous contrast.  COMPARISON:  None. FINDINGS: Atrophy and periventricular small vessel disease is identified. There are likely some old lacunar infarcts as well in the deep white matter structures. No evidence of hemorrhage, acute infarction, edema, mass effect, extra-axial fluid collection, hydrocephalus or mass lesion. The skull is unremarkable. IMPRESSION: No acute findings. Atrophy, small vessel disease and old lacunar infarcts identified. Electronically Signed   By: Aletta Edouard M.D.   On: 08/29/2015 20:53   Dg Esophagus  09/03/2015  CLINICAL DATA:  Evaluate esophageal dysmotility and dysphagia. History of "Nutcracker esophagus" . EXAM: ESOPHOGRAM/BARIUM SWALLOW TECHNIQUE: Single contrast examination was performed using  thin barium. FLUOROSCOPY TIME:  Radiation Exposure Index (as provided by the fluoroscopic device): 32.4 mg Y Number of Acquired Images:  None COMPARISON:  Chest radiograph of 08/31/2015 and back to 08/29/2015. FINDINGS: Focused, single-contrast exam performed in an LPO position (secondary to patient clinical status). Incomplete primary peristaltic wave with tertiary contractions and contrast stasis throughout the thoracic esophagus. No persistent dominant esophageal stricture identified. Contrast passage into the stomach, which is normal in caliber and positioned under in elevated left hemidiaphragm. 13 mm barium tablet has delayed passage in the distal esophagus, including on series 41. IMPRESSION: 1. Limited exam, as detailed above. 2. Severe esophageal dysmotility, likely presbyesophagus. 3. No dominant esophageal stricture. 4. Stomach positioned under an elevated left hemidiaphragm. 5. Delayed passage of 13 mm barium tablet, likely due to esophageal dysmotility. Electronically Signed   By: Abigail Miyamoto M.D.   On: 09/03/2015 12:18   Dg Chest Port 1 View  09/04/2015  CLINICAL DATA:  Onset of shortness of breath today, no other complaints, history of pneumonia, former smoker. EXAM: PORTABLE CHEST 1  VIEW COMPARISON:  Portable chest x-ray of August 31, 2015 FINDINGS: Chronic elevation of the left hemidiaphragm is again observed. Interstitial density in the inferior aspect of the aerated portion of the left lung is slightly more conspicuous today. The right lung is adequately inflated. The interstitial markings are coarse though stable. There is stable mild blunting of the right lateral costophrenic angle. The cardiac silhouette is enlarged but its left border indistinct. There is multilevel degenerative disc disease of the thoracic spine. IMPRESSION: Slight interval increase in airspace opacity in the inferior aspect of the left lung. This is accentuated by persistent elevation of the left hemidiaphragm. Stable chronic bronchitic changes in the right lung. Stable cardiomegaly without pulmonary vascular congestion. Electronically Signed   By: David  Martinique M.D.   On: 09/04/2015 07:49   Dg Chest Port 1 View  08/31/2015  CLINICAL DATA:  82 year old male status post endoscopy with food impaction in esophagus discovered. Query aspiration. Initial encounter. EXAM: PORTABLE CHEST 1 VIEW COMPARISON:  08/30/2015. FINDINGS: Portable AP semi upright view at 1541 hours. Continued moderate elevation of the left hemidiaphragm and patchy opacity at the left lung base. No confluent opacity in the right  lung. Stable cardiomegaly and mediastinal contours. No pneumothorax. No definite effusion. IMPRESSION: Continued moderate elevation of the left hemidiaphragm with nonspecific left lung base opacity which could reflect some left lung aspiration. No definite acute findings on the right. Electronically Signed   By: Genevie Ann M.D.   On: 08/31/2015 15:56    Microbiology: Recent Results (from the past 240 hour(s))  Blood Culture (routine x 2)     Status: None   Collection Time: 08/30/15  3:37 PM  Result Value Ref Range Status   Specimen Description BLOOD LEFT ANTECUBITAL  Final   Special Requests BOTTLES DRAWN AEROBIC AND  ANAEROBIC 5CC  Final   Culture   Final    NO GROWTH 5 DAYS Performed at [facility]    Report Status 09/04/2015 FINAL  Final  Blood Culture (routine x 2)     Status: None   Collection Time: 08/30/15  3:38 PM  Result Value Ref Range Status   Specimen Description BLOOD RIGHT ANTECUBITAL  Final   Special Requests BOTTLES DRAWN AEROBIC AND ANAEROBIC 5CC  Final   Culture   Final    NO GROWTH 5 DAYS Performed at [facility]    Report Status 09/04/2015 FINAL  Final  Urine culture     Status: None   Collection Time: 08/30/15  5:24 PM  Result Value Ref Range Status   Specimen Description URINE, CLEAN CATCH  Final   Special Requests NONE  Final   Culture   Final    NO GROWTH 1 DAY Performed at [facility]    Report Status 08/31/2015 FINAL  Final  MRSA PCR Screening     Status: None   Collection Time: 08/30/15  9:16 PM  Result Value Ref Range Status   MRSA by PCR NEGATIVE NEGATIVE Final    Comment:        The GeneXpert MRSA Assay (FDA approved for NASAL specimens only), is one component of a comprehensive MRSA colonization surveillance program. It is not intended to diagnose MRSA infection nor to guide or monitor treatment for MRSA infections.      Labs: Basic Metabolic Panel:  Recent Labs Lab 09/02/15 0541 09/04/15 0529 09/05/15 0515 09/06/15 0526 09/07/15 0516  NA 143 140 141 139 136  K 3.0* 3.2* 3.0* 3.3* 3.9  CL 109 106 103 99* 101  CO2 28 25 29 31 30   GLUCOSE 118* 112* 100* 96 106*  BUN 12 11 15 16 18   CREATININE 0.73 0.63 0.63 0.76 0.70  CALCIUM 8.9 8.6* 8.7* 8.7* 8.9  MG  --   --   --   --  2.0   Liver Function Tests:  Recent Labs Lab 09/02/15 0541 09/04/15 0529  AST 17 15  ALT 11* 11*  ALKPHOS 60 52  BILITOT 0.9 0.6  PROT 5.5* 5.3*  ALBUMIN 3.1* 2.5*   No results for input(s): LIPASE, AMYLASE in the last 168 hours. No results for input(s): AMMONIA in the last 168 hours. CBC:  Recent Labs Lab 09/01/15 0557  09/02/15 0541 09/04/15 0529 09/05/15 0515  WBC 9.4 11.7* 19.8* 12.1*  HGB 10.9* 11.1* 10.9* 10.4*  HCT 33.9* 33.1* 32.5* 31.7*  MCV 92.4 89.0 88.1 88.5  PLT 177 172 173 179   Cardiac Enzymes: No results for input(s): CKTOTAL, CKMB, CKMBINDEX, TROPONINI in the last 168 hours. BNP: BNP (last 3 results)  Recent Labs  08/30/15 1533  BNP 120.6*    ProBNP (last 3 results) No results for input(s): PROBNP  in the last 8760 hours.  CBG: No results for input(s): GLUCAP in the last 168 hours.     Signed:  Birdie Hopes MD.  [facility] 09/07/2015, 2:32 PM

## 2015-09-07 NOTE — Care Management Important Message (Signed)
Important Message  Patient Details  Name: MARITZA SLIKER MRN: [id number] Date of Birth: 08-18-1914   Medicare Important Message Given:  Yes    Camillo Flaming 09/07/2015, 9:17 AMImportant Message  Patient Details  Name: WELLS MORING MRN: [id number] Date of Birth: 1914-12-28   Medicare Important Message Given:  Yes    Camillo Flaming 09/07/2015, 9:17 AM

## 2015-09-07 NOTE — Clinical Social Work Note (Signed)
Clinical Social Work Assessment  Patient Details  Name: Jack Cantrell MRN: [id number] Date of Birth: September 16, 1914  Date of referral:  09/07/15               Reason for consult:  Facility Placement, Discharge Planning                Permission sought to share information with:  Facility Art therapist granted to share information::  Yes, Verbal Permission Granted  Name::        Agency::     Relationship::     Contact Information:     Housing/Transportation Living arrangements for the past 2 months:  [place] of Information:  Adult Children Patient Interpreter Needed:  None Criminal Activity/Legal Involvement Pertinent to Current Situation/Hospitalization:  No - Comment as needed Significant Relationships:  Adult Children Lives with:  Self Do you feel safe going back to the place where you live?  No (SNF placement needed.) Need for family participation in patient care:  Yes (Comment)  Care giving concerns:  Pt's care cannot be managed at home following hospital d/c.   Social Worker assessment / plan:  Pt hospitalized on 08/30/15 with sepsis secondary to pneumonia. Pt is from the [employer]. CSW met with pt / son to assist with d/c planning. PT has recommended SNF placement at d/c. Pt / son agree with this plan. SNF search has been initiated. Pt's son is [facility] in [facility].  Employment status:  Retired Nurse, adult PT Recommendations:  [place] / Referral to community resources:  [place]  Patient/Family's Response to care:  Pt / son are in agreement with plan for [facility].  Patient/Family's Understanding of and Emotional Response to Diagnosis, Current Treatment, and Prognosis:  Pt's son is aware of pt's medical status. " I hope my dad can return to his Independent apt after rehab. He's always been so independent. " Support  provided.  Emotional Assessment Appearance:  Appears stated age Attitude/Demeanor/Rapport:  Other (cooperative) Affect (typically observed):  Calm, Appropriate, Pleasant Orientation:  Oriented to Self, Oriented to Place, Oriented to  Time, Oriented to Situation Alcohol / Substance use:  Not Applicable Psych involvement (Current and /or in the community):  No (Comment)  Discharge Needs  Concerns to be addressed:  Discharge Planning Concerns Readmission within the last 30 days:  No Current discharge risk:  None Barriers to Discharge:  No Barriers Identified   Luretha Rued, [place] 09/07/2015, 10:54 AM

## 2015-09-07 NOTE — Progress Notes (Signed)
Pt discharged from the unit via [facility]. Pt belongings were sent home with family. Report called to [facility] and report called to Verdis Frederickson, [place]. No questions or concerns at this time.  Amaryllis Malmquist W Eaton [place], RN

## 2015-09-07 NOTE — Progress Notes (Signed)
Date:  September 07, 2015 Chart reviewed for concurrent status and case management needs. Will continue to follow patient for changes and needs: Lanae Federer, BSN, RN, CCM   [phone] 

## 2015-09-09 NOTE — Clinical Social Work Placement (Signed)
   CLINICAL SOCIAL WORK PLACEMENT  NOTE  Date:  09/09/2015  Patient Details  Name: Jack Cantrell MRN: [id number] Date of Birth: Sep 27, 1914  Clinical Social Work is seeking post-discharge placement for this patient at the [place] level of care (*CSW will initial, date and re-position this form in  chart as items are completed):  Yes   Patient/family provided with [place] Work Department's list of facilities offering this level of care within the geographic area requested by the patient (or if unable, by the patient's family).  Yes   Patient/family informed of their freedom to choose among providers that offer the needed level of care, that participate in Medicare, Medicaid or managed care program needed by the patient, have an available bed and are willing to accept the patient.  Yes   Patient/family informed of [place]'s ownership interest in [facility] and [facility], as well as of the fact that they are under no obligation to receive care at these facilities.  PASRR submitted to EDS on 09/07/15     PASRR number received on 09/07/15     Existing PASRR number confirmed on       FL2 transmitted to all facilities in geographic area requested by pt/family on 09/07/15     FL2 transmitted to all facilities within larger geographic area on       Patient informed that his/her managed care company has contracts with or will negotiate with certain facilities, including the following:        Yes   Patient/family informed of bed offers received.  Patient chooses bed at [facility]     Physician recommends and patient chooses bed at      Patient to be transferred to [facility] on 09/07/15.  Patient to be transferred to facility by [facility]     Patient family notified on 09/07/15 of transfer.  Name of family member notified:  SON     PHYSICIAN       Additional Comment: Pt / son were in agreement with d/c  to [facility] on 09/07/15. [facility] transport required. Medical necessity form completed. D/C summary sent to SNF for review prior to d/c, # for report provided to nsg.   _______________________________________________ Luretha Rued, LCSW 09/09/2015, 9:41 AM

## 2015-09-11 ENCOUNTER — Encounter: Payer: Self-pay | Admitting: Internal Medicine

## 2015-09-11 ENCOUNTER — Non-Acute Institutional Stay (SKILLED_NURSING_FACILITY): Payer: Medicare Other | Admitting: Internal Medicine

## 2015-09-11 DIAGNOSIS — N400 Enlarged prostate without lower urinary tract symptoms: Secondary | ICD-10-CM

## 2015-09-11 DIAGNOSIS — J189 Pneumonia, unspecified organism: Secondary | ICD-10-CM | POA: Diagnosis not present

## 2015-09-11 DIAGNOSIS — A419 Sepsis, unspecified organism: Secondary | ICD-10-CM

## 2015-09-11 DIAGNOSIS — R1310 Dysphagia, unspecified: Secondary | ICD-10-CM | POA: Diagnosis not present

## 2015-09-11 DIAGNOSIS — F0390 Unspecified dementia without behavioral disturbance: Secondary | ICD-10-CM | POA: Diagnosis not present

## 2015-09-11 DIAGNOSIS — M7989 Other specified soft tissue disorders: Secondary | ICD-10-CM | POA: Diagnosis not present

## 2015-09-11 DIAGNOSIS — K224 Dyskinesia of esophagus: Secondary | ICD-10-CM | POA: Diagnosis not present

## 2015-09-11 DIAGNOSIS — M1A09X0 Idiopathic chronic gout, multiple sites, without tophus (tophi): Secondary | ICD-10-CM

## 2015-09-16 ENCOUNTER — Encounter: Payer: Self-pay | Admitting: Internal Medicine

## 2015-09-16 DIAGNOSIS — M7989 Other specified soft tissue disorders: Secondary | ICD-10-CM | POA: Insufficient documentation

## 2015-09-16 DIAGNOSIS — M109 Gout, unspecified: Secondary | ICD-10-CM | POA: Insufficient documentation

## 2015-09-16 DIAGNOSIS — R1310 Dysphagia, unspecified: Secondary | ICD-10-CM | POA: Insufficient documentation

## 2015-09-16 NOTE — Assessment & Plan Note (Signed)
likely due to aspiration pneumonia  - On admission, sepsis physiology resolved, received Unasyn for 7 days. - No antibiotics on discharge.

## 2015-09-16 NOTE — Assessment & Plan Note (Signed)
SNF - chronic, apparently had a flare in hospital;plan - cont allopurinol

## 2015-09-16 NOTE — Assessment & Plan Note (Signed)
chronic, bilateral, patient endorses history of gout, resumed his home allopurinol, given colchicine for few days - XR negative for fractures or acute findings. - Arms were swollen, kept elevated and Lasix given his improved

## 2015-09-16 NOTE — Assessment & Plan Note (Signed)
This problem has been chronic, history of nutcracker esophagus and strictures. Last endoscopy several years ago. - Gastroenterology was consulted, and patient underwent an EGD 4/7 which showed food stuck in the esophagus status post removal, with local irritation and erythema as well as esophageal stenosis which was not dilated due to friable appearance. - management per GI, esophagogram 4/10 with severe dysmotility without any specific dominant esophageal stricture  - GI recommended hyoscyamine, and to hold on esophageal dilatation. -Patient tolerates his diet okay without any further complaints. Continue Levsin. SNF - cont levsin; per pt's son there was a discussion to increase hyoscyamine and that wasn't done at D/C so I increased it to 0.375 q8 up from q 12

## 2015-09-16 NOTE — Assessment & Plan Note (Signed)
SNF - cont proscar daily

## 2015-09-16 NOTE — Assessment & Plan Note (Addendum)
Chronic, controlled with meds, hyoscymine which I increased to q 8 hrs as per discussion with  GI and son while at hospital

## 2015-09-16 NOTE — Assessment & Plan Note (Signed)
SNF - chronic ,stable;cont aricept 5 mg

## 2015-09-16 NOTE — Progress Notes (Addendum)
MRN: [id number] Name: Jack Cantrell  Sex: male Age: 82 y.o. DOB: December 12, 1914  [place] [facility]: [facility] Facility/Room:511 Level Of Care: SNF Provider: Inocencio Homes D Emergency Contacts: Extended Emergency Contact Information Primary Emergency Contact: Gerads,[name] Address: [address]          [place], [place] [address] [place] Phone: [phone] Mobile Phone: [phone] Relation: Son  Code Status:   Allergies: Review of patient's allergies indicates no known allergies.  Chief Complaint  Patient presents with  . New Admit To SNF    HPI: Patient is 83 y.o. male with hiatal hernia, gout, nutcracker esophagus; who presents from the [facility] facility with a 4 day history of coughing. appearing to be dehydrated and confused. Was seen in the ED yesterday and thought to have pneumonia for which he was given Levaquin. He only took about 2 doses. Son notes that he is coughing and spitting up anytime he tries to eat or drink anything. Denies vomiting blood. This morning he had red Jell-O around 11 AM, but still appears to be spitting up thick reddish material. Son notes that he has previously been treated for esophageal dysmotility with a dilation in his early 40s and subsequently was advised to eat a repeat dilation for which the family declined due to his age. Putting him under general anesthesia. History of esophageal nutcracker is being currently treated with Hyoscyamine. Due to the patient's frequent coughing he's had decreased overall appetite and generalized weakness. Denies any chest pain, shortness of breath, fever, or diarrhea.  Upon arrival patient is evaluated and had a repeat chest x-ray which showed worsening bilateral pulmonary infiltrates. BNP was seen to be slightly elevated at 120.6. Initial lactic acid was 1.7 1 repeat was 2.19. Pt was admitted to [facility] from 4/6-14 where he was treated for sepsis 2/2 aspiration PNA and his dysphagia was  investigated and tx. Pt is admitted to SNF with generalized weakness. While at SNF pt wil be followed for dementia. tx with aricept, gout, tx with allopurinol and BPH ,tx withproscar.   Past Medical History  Diagnosis Date  . Esophageal abnormality   . Gout   . Cancer [facility])     Past Surgical History  Procedure Laterality Date  . Joint replacement      Hip  . Esophagogastroduodenoscopy (egd) with propofol N/A 08/31/2015    Procedure: ESOPHAGOGASTRODUODENOSCOPY (EGD) WITH PROPOFOL;  Surgeon: Wonda Horner, MD;  Location: [facility] ENDOSCOPY;  Service: Endoscopy;  Laterality: N/A;      Medication List       This list is accurate as of: 09/11/15 11:59 PM.  Always use your most recent med list.               allopurinol 300 MG tablet  Commonly known as:  ZYLOPRIM  Take 300 mg by mouth daily.     aspirin EC 81 MG tablet  Take 81 mg by mouth daily.     donepezil 5 MG tablet  Commonly known as:  ARICEPT  Take 5 mg by mouth every evening.     finasteride 5 MG tablet  Commonly known as:  PROSCAR  Take 5 mg by mouth daily.     hyoscyamine 0.375 MG 12 hr tablet  Commonly known as:  LEVBID  Take 0.375 mg by mouth 2 (two) times daily. Cramping     loratadine 10 MG tablet  Commonly known as:  CLARITIN  Take 10 mg by mouth daily.  mometasone 50 MCG/ACT nasal spray  Commonly known as:  NASONEX  Place 1 spray into the nose daily as needed (cold symptoms).     multivitamin with minerals Tabs tablet  Take 1 tablet by mouth daily.     omeprazole 40 MG capsule  Commonly known as:  PRILOSEC  Take 40 mg by mouth daily.     Probiotic Caps  Take 1 capsule by mouth daily.        No orders of the defined types were placed in this encounter.     There is no immunization history on file for this patient.  Social History  Substance Use Topics  . Smoking status: Former Research scientist (life sciences)  . Smokeless tobacco: Not on file  . Alcohol Use: No    Family history is + HF, brother   Review  of Systems  DATA OBTAINED: from patient, nurse and son GENERAL:  no fevers, fatigue, appetite changes SKIN: No itching, rash or wounds EYES: No eye pain, redness, discharge EARS: No earache, tinnitus, change in hearing NOSE: No congestion, drainage or bleeding  MOUTH/THROAT: No mouth or tooth pain, No sore throat RESPIRATORY: No cough, wheezing, SOB CARDIAC: No chest pain, palpitations, lower extremity edema  GI: No abdominal pain, No N/V/D or constipation, No heartburn or reflux  GU: No dysuria, frequency or urgency, or incontinence  MUSCULOSKELETAL: No unrelieved bone/joint pain NEUROLOGIC: No headache, dizziness or focal weakness PSYCHIATRIC: No c/o anxiety or sadness   Filed Vitals:   09/16/15 1237  BP: 118/68  Pulse: 73  Temp: 97.2 F (36.2 C)  Resp: 18    SpO2 Readings from Last 1 Encounters:  09/07/15 100%        Physical Exam  GENERAL APPEARANCE: Alert, conversant,  No acute distress.  SKIN: No diaphoresis rash HEAD: Normocephalic, atraumatic  EYES: Conjunctiva/lids clear. Pupils round, reactive. EOMs intact.  EARS: External exam WNL, canals clear. Hearing grossly normal.  NOSE: No deformity or discharge.  MOUTH/THROAT: Lips w/o lesions  RESPIRATORY: Breathing is even, unlabored. Lung sounds are clear , wearing O2 [place]  CARDIOVASCULAR: Heart RRR no murmurs, rubs or gallops. No peripheral edema.   GASTROINTESTINAL: Abdomen is soft, non-tender, not distended w/ normal bowel sounds. GENITOURINARY: Bladder non tender, not distended  MUSCULOSKELETAL: No abnormal joints or musculature NEUROLOGIC:  Cranial nerves 2-12 grossly intact. Moves all extremities  PSYCHIATRIC: Mood and affect appropriate to situation with dementia, no behavioral issues  Patient Active Problem List   Diagnosis Date Noted  . Dysphagia 09/16/2015  . Arm swelling 09/16/2015  . Gout 09/16/2015  . Pressure ulcer 08/31/2015  . BPH (benign prostatic hypertrophy) 08/31/2015  . Dementia without  behavioral disturbance 08/31/2015  . 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 Sepsis ([place]) 08/31/2015  . Pneumonia 08/30/2015  . CAP (community acquired pneumonia) 08/30/2015    CBC    Component Value Date/Time   WBC 12.1* 09/05/2015 0515   RBC 3.58* 09/05/2015 0515   HGB 10.4* 09/05/2015 0515   HCT 31.7* 09/05/2015 0515   PLT 179 09/05/2015 0515   MCV 88.5 09/05/2015 0515   LYMPHSABS 0.5* 08/30/2015 1533   MONOABS 0.9 08/30/2015 1533   EOSABS 0.0 08/30/2015 1533   BASOSABS 0.0 08/30/2015 1533    CMP     Component Value Date/Time   NA 136 09/07/2015 0516   K 3.9 09/07/2015 0516   CL 101 09/07/2015 0516   CO2 30 09/07/2015 0516   GLUCOSE 106* 09/07/2015 0516   BUN 18 09/07/2015 0516   CREATININE 0.70  09/07/2015 0516   CALCIUM 8.9 09/07/2015 0516   PROT 5.3* 09/04/2015 0529   ALBUMIN 2.5* 09/04/2015 0529   AST 15 09/04/2015 0529   ALT 11* 09/04/2015 0529   ALKPHOS 52 09/04/2015 0529   BILITOT 0.6 09/04/2015 0529   GFRNONAA >60 09/07/2015 0516   GFRAA >60 09/07/2015 0516    No results found for: HGBA1C   Dg Chest 2 View  08/30/2015  CLINICAL DATA:  Nausea, vomiting and cough. EXAM: CHEST  2 VIEW COMPARISON:  None. FINDINGS: Normal heart size. Aortic atherosclerosis is identified. Decreased lung volumes with asymmetric elevation of the left hemidiaphragm. Diffuse opacities are identified in both lungs. Are identified bilaterally which may reflect multifocal pneumonia and or edema. IMPRESSION: 1. Diffuse bilateral pulmonary opacities compatible with edema versus multifocal infection. Electronically Signed   By: Kerby Moors M.D.   On: 08/30/2015 17:14   Dg Chest Port 1 View  08/31/2015  CLINICAL DATA:  82 year old male status post endoscopy with food impaction in esophagus discovered. Query aspiration. Initial encounter. EXAM: PORTABLE CHEST 1 VIEW COMPARISON:  08/30/2015. FINDINGS: Portable AP semi upright view at 1541 hours. Continued moderate elevation of the left  hemidiaphragm and patchy opacity at the left lung base. No confluent opacity in the right lung. Stable cardiomegaly and mediastinal contours. No pneumothorax. No definite effusion. IMPRESSION: Continued moderate elevation of the left hemidiaphragm with nonspecific left lung base opacity which could reflect some left lung aspiration. No definite acute findings on the right. Electronically Signed   By: Genevie Ann M.D.   On: 08/31/2015 15:56    Not all labs, radiology exams or other studies done during hospitalization come through on my EPIC note; however they are reviewed by me.    Assessment and Plan  Dysphagia This problem has been chronic, history of nutcracker esophagus and strictures. Last endoscopy several years ago. - Gastroenterology was consulted, and patient underwent an EGD 4/7 which showed food stuck in the esophagus status post removal, with local irritation and erythema as well as esophageal stenosis which was not dilated due to friable appearance. - management per GI, esophagogram 4/10 with severe dysmotility without any specific dominant esophageal stricture  - GI recommended hyoscyamine, and to hold on esophageal dilatation. -Patient tolerates his diet okay without any further complaints. Continue Levsin. SNF - cont levsin; per pt's son there was a discussion to increase hyoscyamine and that wasn't done at D/C so I increased it to 0.375 q8 up from q 12  Sepsis ([place]) likely due to aspiration pneumonia  - On admission, sepsis physiology resolved, received Unasyn for 7 days. - No antibiotics on discharge.   CAP (community acquired pneumonia) likely due to aspiration pneumonia  - On admission, sepsis physiology resolved, received Unasyn for 7 days. - No antibiotics on discharge.   BPH (benign prostatic hypertrophy) SNF - cont proscar daily  Dementia without behavioral disturbance SNF - chronic ,stable;cont aricept 5 mg  Arm swelling chronic, bilateral, patient endorses  history of gout, resumed his home allopurinol, given colchicine for few days - XR negative for fractures or acute findings. - Arms were swollen, kept elevated and Lasix given his improved  Gout SNF - chronic, apparently had a flare in hospital;plan - cont allopurinol  Nutcracker esophagus Chronic, controlled with meds, hyoscymine which I increased to q 8 hrs as per discussion with  GI and son while at hospital   Time spent > 45 min;> 50% of time with patient was spent reviewing records, labs, tests and  studies, counseling and developing plan of care  [name], MD

## 2015-09-16 NOTE — Progress Notes (Signed)
This encounter was created in error - please disregard.

## 2015-09-23 ENCOUNTER — Inpatient Hospital Stay (HOSPITAL_COMMUNITY)
Admission: EM | Admit: 2015-09-23 | Discharge: 2015-09-26 | DRG: 393 | Disposition: A | Discharge status: Skilled Nursing Facility | Payer: Medicare Other | Attending: Internal Medicine | Admitting: Internal Medicine

## 2015-09-23 ENCOUNTER — Emergency Department (HOSPITAL_COMMUNITY): Payer: Medicare Other | Admitting: Certified Registered"

## 2015-09-23 ENCOUNTER — Encounter (HOSPITAL_COMMUNITY): Payer: Self-pay | Admitting: Emergency Medicine

## 2015-09-23 ENCOUNTER — Emergency Department (HOSPITAL_COMMUNITY): Payer: Medicare Other

## 2015-09-23 ENCOUNTER — Encounter (HOSPITAL_COMMUNITY)
Admission: EM | Disposition: A | Discharge status: Skilled Nursing Facility | Payer: Self-pay | Source: Home / Self Care | Attending: Internal Medicine

## 2015-09-23 DIAGNOSIS — K224 Dyskinesia of esophagus: Secondary | ICD-10-CM | POA: Diagnosis present

## 2015-09-23 DIAGNOSIS — T18128A Food in esophagus causing other injury, initial encounter: Secondary | ICD-10-CM | POA: Diagnosis not present

## 2015-09-23 DIAGNOSIS — E440 Moderate protein-calorie malnutrition: Secondary | ICD-10-CM | POA: Insufficient documentation

## 2015-09-23 DIAGNOSIS — J189 Pneumonia, unspecified organism: Secondary | ICD-10-CM

## 2015-09-23 DIAGNOSIS — K222 Esophageal obstruction: Secondary | ICD-10-CM | POA: Diagnosis present

## 2015-09-23 DIAGNOSIS — J9690 Respiratory failure, unspecified, unspecified whether with hypoxia or hypercapnia: Secondary | ICD-10-CM

## 2015-09-23 DIAGNOSIS — I10 Essential (primary) hypertension: Secondary | ICD-10-CM | POA: Diagnosis present

## 2015-09-23 DIAGNOSIS — J9600 Acute respiratory failure, unspecified whether with hypoxia or hypercapnia: Secondary | ICD-10-CM | POA: Diagnosis present

## 2015-09-23 DIAGNOSIS — R1310 Dysphagia, unspecified: Secondary | ICD-10-CM

## 2015-09-23 DIAGNOSIS — G9340 Encephalopathy, unspecified: Secondary | ICD-10-CM | POA: Diagnosis present

## 2015-09-23 DIAGNOSIS — Z7982 Long term (current) use of aspirin: Secondary | ICD-10-CM

## 2015-09-23 DIAGNOSIS — X58XXXA Exposure to other specified factors, initial encounter: Secondary | ICD-10-CM | POA: Diagnosis present

## 2015-09-23 DIAGNOSIS — Z79899 Other long term (current) drug therapy: Secondary | ICD-10-CM

## 2015-09-23 DIAGNOSIS — K117 Disturbances of salivary secretion: Secondary | ICD-10-CM | POA: Diagnosis present

## 2015-09-23 DIAGNOSIS — H9190 Unspecified hearing loss, unspecified ear: Secondary | ICD-10-CM | POA: Diagnosis present

## 2015-09-23 DIAGNOSIS — K220 Achalasia of cardia: Secondary | ICD-10-CM | POA: Diagnosis present

## 2015-09-23 DIAGNOSIS — Z66 Do not resuscitate: Secondary | ICD-10-CM | POA: Diagnosis present

## 2015-09-23 DIAGNOSIS — Z87891 Personal history of nicotine dependence: Secondary | ICD-10-CM

## 2015-09-23 DIAGNOSIS — H5702 Anisocoria: Secondary | ICD-10-CM | POA: Diagnosis present

## 2015-09-23 DIAGNOSIS — M109 Gout, unspecified: Secondary | ICD-10-CM | POA: Diagnosis present

## 2015-09-23 DIAGNOSIS — R4182 Altered mental status, unspecified: Secondary | ICD-10-CM | POA: Insufficient documentation

## 2015-09-23 DIAGNOSIS — J690 Pneumonitis due to inhalation of food and vomit: Secondary | ICD-10-CM | POA: Diagnosis present

## 2015-09-23 HISTORY — PX: ESOPHAGOGASTRODUODENOSCOPY: SHX5428

## 2015-09-23 LAB — BASIC METABOLIC PANEL
ANION GAP: 9 (ref 5–15)
BUN: 15 mg/dL (ref 6–20)
CO2: 26 mmol/L (ref 22–32)
Calcium: 11.3 mg/dL — ABNORMAL HIGH (ref 8.9–10.3)
Chloride: 104 mmol/L (ref 101–111)
Creatinine, Ser: 0.74 mg/dL (ref 0.61–1.24)
Glucose, Bld: 104 mg/dL — ABNORMAL HIGH (ref 65–99)
Potassium: 3.8 mmol/L (ref 3.5–5.1)
SODIUM: 139 mmol/L (ref 135–145)

## 2015-09-23 LAB — CBC
HCT: 39.8 % (ref 39.0–52.0)
Hemoglobin: 13.1 g/dL (ref 13.0–17.0)
MCH: 29.2 pg (ref 26.0–34.0)
MCHC: 32.9 g/dL (ref 30.0–36.0)
MCV: 88.6 fL (ref 78.0–100.0)
PLATELETS: 306 10*3/uL (ref 150–400)
RBC: 4.49 MIL/uL (ref 4.22–5.81)
RDW: 13.5 % (ref 11.5–15.5)
WBC: 9.4 10*3/uL (ref 4.0–10.5)

## 2015-09-23 SURGERY — CANCELLED PROCEDURE

## 2015-09-23 SURGERY — EGD (ESOPHAGOGASTRODUODENOSCOPY)
Anesthesia: Choice

## 2015-09-23 MED ORDER — ONDANSETRON HCL 4 MG/2ML IJ SOLN
INTRAMUSCULAR | Status: AC
  Filled 2015-09-23: qty 2

## 2015-09-23 MED ORDER — PROPOFOL 10 MG/ML IV BOLUS
INTRAVENOUS | Status: AC
  Filled 2015-09-23: qty 20

## 2015-09-23 MED ORDER — SODIUM CHLORIDE 0.9 % IV BOLUS (SEPSIS)
500.0000 mL | Freq: Once | INTRAVENOUS | Status: AC
  Administered 2015-09-23: 500 mL via INTRAVENOUS

## 2015-09-23 MED ORDER — ONDANSETRON HCL 4 MG/2ML IJ SOLN
INTRAMUSCULAR | Status: DC | PRN
  Administered 2015-09-23: 4 mg via INTRAVENOUS

## 2015-09-23 MED ORDER — PROPOFOL 10 MG/ML IV BOLUS
INTRAVENOUS | Status: DC | PRN
  Administered 2015-09-23: 100 mg via INTRAVENOUS

## 2015-09-23 MED ORDER — SODIUM CHLORIDE 0.9 % IJ SOLN
INTRAMUSCULAR | Status: AC
  Filled 2015-09-23: qty 10

## 2015-09-23 MED ORDER — LABETALOL HCL 5 MG/ML IV SOLN
INTRAVENOUS | Status: AC
  Filled 2015-09-23: qty 4

## 2015-09-23 MED ORDER — LACTATED RINGERS IV SOLN
INTRAVENOUS | Status: DC | PRN
  Administered 2015-09-23: 23:00:00 via INTRAVENOUS

## 2015-09-23 MED ORDER — LIDOCAINE HCL (CARDIAC) 20 MG/ML IV SOLN
INTRAVENOUS | Status: AC
  Filled 2015-09-23: qty 5

## 2015-09-23 MED ORDER — FENTANYL CITRATE (PF) 100 MCG/2ML IJ SOLN: 25.0000 ug | INTRAMUSCULAR | Status: DC | PRN

## 2015-09-23 MED ORDER — SUCCINYLCHOLINE CHLORIDE 20 MG/ML IJ SOLN
INTRAMUSCULAR | Status: DC | PRN
  Administered 2015-09-23: 100 mg via INTRAVENOUS

## 2015-09-23 MED ORDER — OXYCODONE HCL 5 MG PO TABS: 5.0000 mg | ORAL_TABLET | Freq: Once | ORAL | Status: DC | PRN

## 2015-09-23 MED ORDER — GLUCAGON HCL RDNA (DIAGNOSTIC) 1 MG IJ SOLR
1.0000 mg | Freq: Once | INTRAMUSCULAR | Status: AC
  Administered 2015-09-23: 1 mg via INTRAVENOUS
  Filled 2015-09-23: qty 1

## 2015-09-23 MED ORDER — OXYCODONE HCL 5 MG/5ML PO SOLN: 5.0000 mg | Freq: Once | ORAL | Status: DC | PRN

## 2015-09-23 MED ORDER — EPHEDRINE SULFATE 50 MG/ML IJ SOLN
INTRAMUSCULAR | Status: AC
  Filled 2015-09-23: qty 1

## 2015-09-23 MED ORDER — LIDOCAINE HCL (PF) 2 % IJ SOLN
INTRAMUSCULAR | Status: DC | PRN
  Administered 2015-09-23: 30 mg via INTRADERMAL

## 2015-09-23 MED ORDER — ATROPINE SULFATE 0.4 MG/ML IJ SOLN
INTRAMUSCULAR | Status: AC
Start: 2015-09-23 — End: 2015-09-23
  Filled 2015-09-23: qty 1

## 2015-09-23 MED ORDER — LABETALOL HCL 5 MG/ML IV SOLN
INTRAVENOUS | Status: DC | PRN
  Administered 2015-09-23 – 2015-09-24 (×2): 5 mg via INTRAVENOUS

## 2015-09-23 MED ORDER — ONDANSETRON HCL 4 MG/2ML IJ SOLN: 4.0000 mg | Freq: Four times a day (QID) | INTRAMUSCULAR | Status: DC | PRN

## 2015-09-23 NOTE — H&P (View-Only) (Signed)
This encounter was created in error - please disregard.

## 2015-09-23 NOTE — Discharge Instructions (Signed)
° °  Dysphagia Diet Level 1, Pureed The dysphasia level 1 diet includes foods that are completely pureed and smooth. The foods have a pudding-like texture, such as the texture of skinless mashed potatoes, and yogurt. The diet does not include foods with lumps or coarse textures. Liquids should be smooth and may either be thin, nectar-thick, honey-like, or spoon-thick. This diet is helpful for people with moderate to severe swallowing problems. It reduces the risk of food getting caught in the windpipe, trachea, or lungs. You may need help or supervision during meals while following this diet. WHAT DO I NEED TO KNOW ABOUT THIS DIET? Foods  You may eat foods that are soft and have a pudding-like texture. If a food does not have this texture, you may be able to eat the food after:  Pureeing it. This can be done with a blender or whisk.  Avoid foods that are hard, dry, sticky, chunky, lumpy, or stringy. Also avoid foods with nuts, seeds, raisins, skins, and pulp.  Do not eat foods that you have to chew. If you have to chew the food, then you cannot eat it.  Eat a variety of foods to get all the nutrients you need. Liquids  You may drink liquids that are smooth. Your health care provider will tell you if you should drink thin or thickened liquids.  To thicken a liquid, use a food and beverage thickener or a thickening food. Thickened liquids are usually a "pudding-like" consistency.  Thin liquids include fruit juices, milk, coffee, tea, yogurts, shakes, and similar foods that melt to thin liquid at room temperature.  Avoid liquids with seeds, pulp, or chunks. See your dietitian or health care provider regularly for help with your dietary changes. WHAT FOODS CAN I EAT? Vegetables Pureed vegetables. Soft avocado. Smooth tomato paste or sauce. Strained or pureed soups (these may need to be thickened as directed). Pureed potatoes without skin (can be seasoned with butter, smooth gravy, margarine, or  sour cream). Fruits Pureed fruits such as melons and apples without seeds or pulp. Mashed bananas. Smooth tomato paste or sauce. Fruit juices without pulp or seeds. Strained or pureed soups. Meat and Other Protein Sources Pureed meat. Smooth pate or liverwurst. Smooth souffles. Pureed beans (such as lentils). Pureed eggs. Dairy Yogurt. Smooth cheese sauces. Milk (may need to be thickened). Nutritional dairy drinks or shakes. Ask your health care provider whether you can have ice cream. Condiments Finely ground salt, pepper, and other ground spices. Sweets/Desserts Smooth puddings and custards. Pureed desserts. Souffles. Whipped topping. Fats and Oils Butter. Margarine. Smooth gravy. Sour cream. Mayonnaise. Cream cheese. Whipped topping. Smooth sauces (such as white sauce, cheese sauce, or hollandaise sauce). The items listed above may not be a complete list of recommended foods or beverages. Contact your dietitian for more options.  Document Released: 05/12/2005 Document Revised: 06/02/2014 Document Reviewed: 04/25/2013 Elsevier Interactive Patient Education [employer].

## 2015-09-23 NOTE — ED Notes (Signed)
Patient brought in by [facility]. From [place]. Patient recently discharged from this facility about 2 weeks ago of same issue. EMS reports patient unable to keep food down. Patient vomits after eating, no nausea reported prior to vomiting. Symptoms began at lunch time. He ate breakfast without difficulty. Patient reports relief after vomiting. Patient denies pain. No falls reported. Reported to be alert to baseline. Last BM unknown. Patient is HOH hearing aids with patient. Son is on his way.

## 2015-09-23 NOTE — Anesthesia Procedure Notes (Signed)
Procedure Name: Intubation Date/Time: 09/23/2015 10:55 PM Performed by: Lajuana Carry E Pre-anesthesia Checklist: Patient identified, Emergency Drugs available, Suction available and Patient being monitored Patient Re-evaluated:Patient Re-evaluated prior to inductionOxygen Delivery Method: Circle System Utilized Preoxygenation: Pre-oxygenation with 100% oxygen Intubation Type: IV induction, Rapid sequence and Cricoid Pressure applied Ventilation: Mask ventilation without difficulty Laryngoscope Size: Miller and 2 Grade View: Grade I Tube type: Oral Tube size: 7.0 mm Number of attempts: 1 Airway Equipment and Method: Stylet Placement Confirmation: ETT inserted through vocal cords under direct vision,  positive ETCO2 and breath sounds checked- equal and bilateral Secured at: 21 cm Tube secured with: Tape Dental Injury: Teeth and Oropharynx as per pre-operative assessment

## 2015-09-23 NOTE — ED Notes (Signed)
Bed: WA02 Expected date: 09/23/15 Expected time: 6:49 PM Means of arrival: Ambulance Comments: ? SBO

## 2015-09-23 NOTE — ED Provider Notes (Signed)
CSN: [id number]     Arrival date & time 09/23/15  1853 History   First MD Initiated Contact with Patient 09/23/15 1903     Chief Complaint  Patient presents with  . Emesis      HPI Patient has a history of esophageal dysmotility with recent upper EGD after patient noted to have an esophageal food impaction and an entire esophagus of food was removed.  Patient was then followed up with a barium swallow which demonstrated severe dysmotility and only a minor stricture.  He has not had an esophageal dilation since his early 17s.  He presents today because of debility keep fluids down secondary to nausea vomiting and his family is concerned that he has an esophagus.  Food again.  Patient denies chest pain.  No fevers or chills.  He does have a history of prior aspiration pneumonia but currently is without any cough or shortness of breath.  He's been on a soft diet since the last EGD in early April of this year    Past Medical History  Diagnosis Date  . Esophageal abnormality   . Gout   . Cancer [facility])    Past Surgical History  Procedure Laterality Date  . Joint replacement      Hip  . Esophagogastroduodenoscopy (egd) with propofol N/A 08/31/2015    Procedure: ESOPHAGOGASTRODUODENOSCOPY (EGD) WITH PROPOFOL;  Surgeon: Wonda Horner, MD;  Location[facility];  Service: Endoscopy;  Laterality: N/A;   Family History  Problem Relation Age of Onset  . Learning disabilities Sister   . Heart disease Brother     CHF   Social History  Substance Use Topics  . Smoking status: Former Research scientist (life sciences)  . Smokeless tobacco: None  . Alcohol Use: No    Review of Systems  All other systems reviewed and are negative.     Allergies  Review of patient's allergies indicates no known allergies.  Home Medications   Prior to Admission medications   Medication Sig Start Date End Date Taking? Authorizing Provider  allopurinol (ZYLOPRIM) 300 MG tablet Take 300 mg by mouth daily.    Historical Provider, MD   aspirin EC 81 MG tablet Take 81 mg by mouth daily.    Historical Provider, MD  donepezil (ARICEPT) 5 MG tablet Take 5 mg by mouth every evening. 07/27/15   Historical Provider, MD  finasteride (PROSCAR) 5 MG tablet Take 5 mg by mouth daily. 06/15/15   Historical Provider, MD  hyoscyamine (LEVBID) 0.375 MG 12 hr tablet Take 0.375 mg by mouth 2 (two) times daily. Cramping 08/18/15   Historical Provider, MD  loratadine (CLARITIN) 10 MG tablet Take 10 mg by mouth daily.    Historical Provider, MD  mometasone (NASONEX) 50 MCG/ACT nasal spray Place 1 spray into the nose daily as needed (cold symptoms).    Historical Provider, MD  Multiple Vitamin (MULTIVITAMIN WITH MINERALS) TABS tablet Take 1 tablet by mouth daily.    Historical Provider, MD  omeprazole (PRILOSEC) 40 MG capsule Take 40 mg by mouth daily. 08/03/15   Historical Provider, MD  Probiotic CAPS Take 1 capsule by mouth daily.    Historical Provider, MD   BP 112/99 mmHg  Pulse 99  Resp 18  SpO2 100% Physical Exam  Constitutional: He is oriented to person, place, and time. He appears well-developed and well-nourished.  HENT:  Head: Normocephalic and atraumatic.  Eyes: EOM are normal.  Neck: Normal range of motion.  Cardiovascular: Normal rate, regular rhythm, normal heart sounds and  intact distal pulses.   Pulmonary/Chest: Effort normal and breath sounds normal. No respiratory distress.  Abdominal: Soft. He exhibits no distension. There is no tenderness.  Musculoskeletal: Normal range of motion.  Neurological: He is alert and oriented to person, place, and time.  Skin: Skin is warm and dry.  Psychiatric: He has a normal mood and affect. Judgment normal.  Nursing note and vitals reviewed.   ED Course  Procedures (including critical care time) Labs Review Labs Reviewed  BASIC METABOLIC PANEL - Abnormal; Notable for the following:    Glucose, Bld 104 (*)    Calcium 11.3 (*)    All other components within normal limits  CBC     Imaging Review Dg Chest 2 View  09/23/2015  CLINICAL DATA:  Dysphagia, vomiting EXAM: CHEST  2 VIEW COMPARISON:  09/04/2015 FINDINGS: There is elevation of the left diaphragm. There is no focal parenchymal opacity. There is no pleural effusion or pneumothorax. The cardiomediastinal silhouette is normal. The osseous structures are unremarkable. IMPRESSION: No active cardiopulmonary disease. Electronically Signed   By: Kathreen Devoid   On: 09/23/2015 20:20   I have personally reviewed and evaluated these images and lab results as part of my medical decision-making.   EKG Interpretation None      MDM   Final diagnoses:  Dysphagia    Concerning for severe esophageal dysmotility again as well as likely esophageal impaction.  I will discuss the case further with [facility] gastroenterology.  I spoke with Dr. Paulita Fujita who will evaluate the patient in the emergency department and plan on EGD tonight with anesthesia.     Jola Schmidt, MD 09/23/15 2145

## 2015-09-23 NOTE — Interval H&P Note (Signed)
History and Physical Interval Note:  09/23/2015 10:25 PM  Jack Cantrell  has presented today for surgery, with the diagnosis of Food impaction  The various methods of treatment have been discussed with the patient and family. After consideration of risks, benefits and other options for treatment, the patient has consented to  Procedure(s): ESOPHAGOGASTRODUODENOSCOPY (EGD) (N/A) as a surgical intervention .  The patient's history has been reviewed, patient examined, no change in status, stable for surgery.  I have reviewed the patient's chart and labs.  Questions were answered to the patient's satisfaction.     Terrilyn Tyner M  Assessment:  1.  Suspected esophageal food impaction.  Plan:  1.  Endoscopy under general anesthesia, for foreign body removal. 2.  Risks (bleeding, infection, bowel perforation that could require surgery, sedation-related changes in cardiopulmonary systems), benefits (identification and possible treatment of source of symptoms, exclusion of certain causes of symptoms), and alternatives (watchful waiting, radiographic imaging studies, empiric medical treatment) of upper endoscopy (EGD) were explained to patient/family in detail and patient wishes to proceed.

## 2015-09-23 NOTE — Anesthesia Preprocedure Evaluation (Signed)
Anesthesia Evaluation  Patient identified by MRN, date of birth, ID band Patient awake    Reviewed: Allergy & Precautions, NPO status , Patient's Chart, lab work & pertinent test results  Airway Mallampati: II   Neck ROM: limited    Dental   Pulmonary former smoker,    breath sounds clear to auscultation       Cardiovascular negative cardio ROS   Rhythm:regular Rate:Normal     Neuro/Psych    GI/Hepatic   Endo/Other    Renal/GU      Musculoskeletal   Abdominal   Peds  Hematology   Anesthesia Other Findings   Reproductive/Obstetrics                             Anesthesia Physical Anesthesia Plan  ASA: III  Anesthesia Plan: General   Post-op Pain Management:    Induction: Intravenous, Rapid sequence and Cricoid pressure planned  Airway Management Planned: Oral ETT  Additional Equipment:   Intra-op Plan:   Post-operative Plan: Extubation in OR  Informed Consent: I have reviewed the patients History and Physical, chart, labs and discussed the procedure including the risks, benefits and alternatives for the proposed anesthesia with the patient or authorized representative who has indicated his/her understanding and acceptance.     Plan Discussed with: CRNA, Anesthesiologist and Surgeon  Anesthesia Plan Comments:         Anesthesia Quick Evaluation

## 2015-09-23 NOTE — Consult Note (Signed)
[facility] Gastroenterology Consultation Note  Referring Provider: Dr. Jola Schmidt Primary Care Physician:  Kirk Ruths., MD  Reason for Consultation:  Suspected esophageal foreign body  HPI: Jack Cantrell is a 82 y.o. male presenting with sialorrhea and suspected esophageal foreign body.  Similar presentation couple weeks ago, had endoscopy with foreign body removal at that time.  Patient is confused at this time, denies any symptoms.  Patient's son is at the bedside and provides the interval history.   Past Medical History  Diagnosis Date  . Esophageal abnormality   . Gout   . Cancer [facility])     Past Surgical History  Procedure Laterality Date  . Joint replacement      Hip  . Esophagogastroduodenoscopy (egd) with propofol N/A 08/31/2015    Procedure: ESOPHAGOGASTRODUODENOSCOPY (EGD) WITH PROPOFOL;  Surgeon: Wonda Horner, MD;  Location: [facility] ENDOSCOPY;  Service: Endoscopy;  Laterality: N/A;    Prior to Admission medications   Medication Sig Start Date End Date Taking? Authorizing Provider  allopurinol (ZYLOPRIM) 300 MG tablet Take 300 mg by mouth daily.   Yes Historical Provider, MD  Amino Acids-Protein Hydrolys (FEEDING SUPPLEMENT, PRO-STAT SUGAR FREE 64,) LIQD Take 30 mLs by mouth daily.   Yes Historical Provider, MD  aspirin EC 81 MG tablet Take 81 mg by mouth daily.   Yes Historical Provider, MD  donepezil (ARICEPT) 5 MG tablet Take 5 mg by mouth daily.  07/27/15  Yes Historical Provider, MD  ENSURE PLUS (ENSURE PLUS) LIQD Take 237 mLs by mouth 2 (two) times daily between meals.   Yes Historical Provider, MD  finasteride (PROSCAR) 5 MG tablet Take 5 mg by mouth daily. 06/15/15  Yes Historical Provider, MD  hyoscyamine (LEVBID) 0.375 MG 12 hr tablet Take 0.375 mg by mouth 3 (three) times daily. Cramping 08/18/15  Yes Historical Provider, MD  Multiple Vitamins-Minerals (MULTIVITAMIN ADULTS PO) Take 1 tablet by mouth daily.   Yes Historical Provider, MD  omeprazole (PRILOSEC) 40 MG  capsule Take 40 mg by mouth daily. 08/03/15  Yes Historical Provider, MD    No current facility-administered medications for this encounter.   Current Outpatient Prescriptions  Medication Sig Dispense Refill  . allopurinol (ZYLOPRIM) 300 MG tablet Take 300 mg by mouth daily.    . Amino Acids-Protein Hydrolys (FEEDING SUPPLEMENT, PRO-STAT SUGAR FREE 64,) LIQD Take 30 mLs by mouth daily.    Marland Kitchen aspirin EC 81 MG tablet Take 81 mg by mouth daily.    Marland Kitchen donepezil (ARICEPT) 5 MG tablet Take 5 mg by mouth daily.     Marland Kitchen ENSURE PLUS (ENSURE PLUS) LIQD Take 237 mLs by mouth 2 (two) times daily between meals.    . finasteride (PROSCAR) 5 MG tablet Take 5 mg by mouth daily.  1  . hyoscyamine (LEVBID) 0.375 MG 12 hr tablet Take 0.375 mg by mouth 3 (three) times daily. Cramping  0  . Multiple Vitamins-Minerals (MULTIVITAMIN ADULTS PO) Take 1 tablet by mouth daily.    Marland Kitchen omeprazole (PRILOSEC) 40 MG capsule Take 40 mg by mouth daily.  11    Allergies as of 09/23/2015  . (No Known Allergies)    Family History  Problem Relation Age of Onset  . Learning disabilities Sister   . Heart disease Brother     CHF    Social History   Social History  . Marital Status: Single    Spouse Name: N/A  . Number of Children: N/A  . Years of Education: N/A   Occupational History  .  Not on file.   Social History Main Topics  . Smoking status: Former Research scientist (life sciences)  . Smokeless tobacco: Not on file  . Alcohol Use: No  . Drug Use: Not on file  . Sexual Activity: Not on file   Other Topics Concern  . Not on file   Social History Narrative    Review of Systems: Unable to obtain due to altered mental status.  Physical Exam: Vital signs in last 24 hours: Pulse Rate:  [99] 99 (04/30 1945) Resp:  [18] 18 (04/30 1945) BP: (112)/(99) 112/99 mmHg (04/30 1945) SpO2:  [99 %-100 %] 100 % (04/30 1945)   General:   Alert, confused, NAD Head:  Normocephalic and atraumatic. Eyes:  Sclera clear, no icterus.   Conjunctiva  pink. Ears:  Normal auditory acuity. Nose:  No deformity, discharge,  or lesions. Mouth:  No deformity or lesions.  Oropharynx pink & moist. Neck:  Supple; no masses or thyromegaly. Lungs:  Clear throughout to auscultation.   No wheezes, crackles, or rhonchi. No acute distress. Heart:  Regular rate and rhythm; no murmurs, clicks, rubs,  or gallops. Abdomen:  Soft, nontender and nondistended. No masses, hepatosplenomegaly or hernias noted. Normal bowel sounds, without guarding, and without rebound.     Msk:  Symmetrical without gross deformities. Normal posture. Pulses:  Normal pulses noted. Extremities:  Without clubbing or edema. Neurologic:  Alert but confused.  Diffusely weak Skin: Scattered ecchymosed, thin skin Cervical Nodes:  No significant cervical adenopathy. Psych:  Alert and cooperative. Normal mood and affect.  Lab Results:  Recent Labs  09/23/15 2019  WBC 9.4  HGB 13.1  HCT 39.8  PLT 306   BMET  Recent Labs  09/23/15 2019  NA 139  K 3.8  CL 104  CO2 26  GLUCOSE 104*  BUN 15  CREATININE 0.74  CALCIUM 11.3*   LFT No results for input(s): PROT, ALBUMIN, AST, ALT, ALKPHOS, BILITOT, BILIDIR, IBILI in the last 72 hours. PT/INR No results for input(s): LABPROT, INR in the last 72 hours.  Studies/Results: Dg Chest 2 View  09/23/2015  CLINICAL DATA:  Dysphagia, vomiting EXAM: CHEST  2 VIEW COMPARISON:  09/04/2015 FINDINGS: There is elevation of the left diaphragm. There is no focal parenchymal opacity. There is no pleural effusion or pneumothorax. The cardiomediastinal silhouette is normal. The osseous structures are unremarkable. IMPRESSION: No active cardiopulmonary disease. Electronically Signed   By: Kathreen Devoid   On: 09/23/2015 20:20    Impression:  1.  Dysphagia.  Known distal esophageal stricture and esophageal dysmotility. 2.  Sialorrhea, with suspected esophageal food bolus.  Plan:  1.  Endoscopy for possible esophageal foreign body removal. 2.   Risks (bleeding, infection, bowel perforation that could require surgery, sedation-related changes in cardiopulmonary systems), benefits (identification and possible treatment of source of symptoms, exclusion of certain causes of symptoms), and alternatives (watchful waiting, radiographic imaging studies, empiric medical treatment) of upper endoscopy (EGD) were explained to patient/family in detail and patient wishes to proceed.     Landry Dyke  09/23/2015, 10:27 PM  Pager [phone] If no answer or after 5 PM call [phone]

## 2015-09-24 ENCOUNTER — Encounter (HOSPITAL_COMMUNITY): Payer: Self-pay | Admitting: Gastroenterology

## 2015-09-24 ENCOUNTER — Inpatient Hospital Stay (HOSPITAL_COMMUNITY): Payer: Medicare Other

## 2015-09-24 DIAGNOSIS — H9190 Unspecified hearing loss, unspecified ear: Secondary | ICD-10-CM | POA: Diagnosis present

## 2015-09-24 DIAGNOSIS — J9600 Acute respiratory failure, unspecified whether with hypoxia or hypercapnia: Secondary | ICD-10-CM | POA: Diagnosis present

## 2015-09-24 DIAGNOSIS — Z66 Do not resuscitate: Secondary | ICD-10-CM | POA: Diagnosis present

## 2015-09-24 DIAGNOSIS — R1310 Dysphagia, unspecified: Secondary | ICD-10-CM | POA: Diagnosis present

## 2015-09-24 DIAGNOSIS — X58XXXA Exposure to other specified factors, initial encounter: Secondary | ICD-10-CM | POA: Diagnosis present

## 2015-09-24 DIAGNOSIS — K220 Achalasia of cardia: Secondary | ICD-10-CM | POA: Diagnosis not present

## 2015-09-24 DIAGNOSIS — I10 Essential (primary) hypertension: Secondary | ICD-10-CM | POA: Diagnosis not present

## 2015-09-24 DIAGNOSIS — E440 Moderate protein-calorie malnutrition: Secondary | ICD-10-CM

## 2015-09-24 DIAGNOSIS — Z79899 Other long term (current) drug therapy: Secondary | ICD-10-CM | POA: Diagnosis not present

## 2015-09-24 DIAGNOSIS — R4182 Altered mental status, unspecified: Secondary | ICD-10-CM | POA: Diagnosis not present

## 2015-09-24 DIAGNOSIS — K222 Esophageal obstruction: Secondary | ICD-10-CM | POA: Diagnosis present

## 2015-09-24 DIAGNOSIS — T18128A Food in esophagus causing other injury, initial encounter: Secondary | ICD-10-CM | POA: Diagnosis not present

## 2015-09-24 DIAGNOSIS — K117 Disturbances of salivary secretion: Secondary | ICD-10-CM | POA: Diagnosis present

## 2015-09-24 DIAGNOSIS — H5702 Anisocoria: Secondary | ICD-10-CM | POA: Diagnosis present

## 2015-09-24 DIAGNOSIS — J690 Pneumonitis due to inhalation of food and vomit: Secondary | ICD-10-CM | POA: Diagnosis not present

## 2015-09-24 DIAGNOSIS — Z7982 Long term (current) use of aspirin: Secondary | ICD-10-CM | POA: Diagnosis not present

## 2015-09-24 DIAGNOSIS — K224 Dyskinesia of esophagus: Secondary | ICD-10-CM | POA: Diagnosis present

## 2015-09-24 DIAGNOSIS — M109 Gout, unspecified: Secondary | ICD-10-CM | POA: Diagnosis present

## 2015-09-24 DIAGNOSIS — Z87891 Personal history of nicotine dependence: Secondary | ICD-10-CM | POA: Diagnosis not present

## 2015-09-24 DIAGNOSIS — G9340 Encephalopathy, unspecified: Secondary | ICD-10-CM | POA: Diagnosis not present

## 2015-09-24 HISTORY — DX: Moderate protein-calorie malnutrition: E44.0

## 2015-09-24 LAB — CBC
HEMATOCRIT: 40.6 % (ref 39.0–52.0)
HEMOGLOBIN: 13.3 g/dL (ref 13.0–17.0)
MCH: 29.1 pg (ref 26.0–34.0)
MCHC: 32.8 g/dL (ref 30.0–36.0)
MCV: 88.8 fL (ref 78.0–100.0)
Platelets: 294 10*3/uL (ref 150–400)
RBC: 4.57 MIL/uL (ref 4.22–5.81)
RDW: 13.6 % (ref 11.5–15.5)
WBC: 14.9 10*3/uL — ABNORMAL HIGH (ref 4.0–10.5)

## 2015-09-24 LAB — GLUCOSE, CAPILLARY
GLUCOSE-CAPILLARY: 106 mg/dL — AB (ref 65–99)
GLUCOSE-CAPILLARY: 85 mg/dL (ref 65–99)
GLUCOSE-CAPILLARY: 114 mg/dL — AB (ref 65–99)

## 2015-09-24 LAB — COMPREHENSIVE METABOLIC PANEL
ALBUMIN: 3.7 g/dL (ref 3.5–5.0)
ALT: 15 U/L — AB (ref 17–63)
AST: 25 U/L (ref 15–41)
Alkaline Phosphatase: 92 U/L (ref 38–126)
Anion gap: 10 (ref 5–15)
BUN: 15 mg/dL (ref 6–20)
CHLORIDE: 105 mmol/L (ref 101–111)
CO2: 25 mmol/L (ref 22–32)
CREATININE: 0.79 mg/dL (ref 0.61–1.24)
Calcium: 10.8 mg/dL — ABNORMAL HIGH (ref 8.9–10.3)
GFR calc Af Amer: >60 mL/min (ref >60)
GFR calc non Af Amer: >60 mL/min (ref >60)
Glucose, Bld: 141 mg/dL — ABNORMAL HIGH (ref 65–99)
POTASSIUM: 3.7 mmol/L (ref 3.5–5.1)
SODIUM: 140 mmol/L (ref 135–145)
Total Bilirubin: 0.3 mg/dL (ref 0.3–1.2)
Total Protein: 7 g/dL (ref 6.5–8.1)

## 2015-09-24 LAB — BLOOD GAS, ARTERIAL
ACID-BASE EXCESS: 0.2 mmol/L (ref 0.0–2.0)
Bicarbonate: 24.9 mEq/L — ABNORMAL HIGH (ref 20.0–24.0)
FIO2: 0.6
MECHVT: 460 mL
O2 SAT: 99.3 %
PEEP: 5 cmH2O
PO2 ART: 191 mmHg — AB (ref 80.0–100.0)
Patient temperature: 96.4
RATE: 15 resp/min
TCO2: 22.4 mmol/L (ref 0–100)
pCO2 arterial: 40.6 mmHg (ref 35.0–45.0)
pH, Arterial: 7.398 (ref 7.350–7.450)

## 2015-09-24 LAB — PHOSPHORUS: PHOSPHORUS: 2.9 mg/dL (ref 2.5–4.6)

## 2015-09-24 LAB — MAGNESIUM: Magnesium: 1.9 mg/dL (ref 1.7–2.4)

## 2015-09-24 LAB — MRSA PCR SCREENING: MRSA by PCR: NEGATIVE

## 2015-09-24 MED ORDER — ANTISEPTIC ORAL RINSE SOLUTION (CORINZ)
7.0000 mL | Freq: Four times a day (QID) | OROMUCOSAL | Status: DC
  Administered 2015-09-24 (×3): 7 mL via OROMUCOSAL

## 2015-09-24 MED ORDER — HYDRALAZINE HCL 20 MG/ML IJ SOLN
10.0000 mg | INTRAMUSCULAR | Status: DC | PRN
  Administered 2015-09-24: 10 mg via INTRAVENOUS
  Filled 2015-09-24: qty 1

## 2015-09-24 MED ORDER — ENOXAPARIN SODIUM 40 MG/0.4ML ~~LOC~~ SOLN
40.0000 mg | SUBCUTANEOUS | Status: DC
Start: 2015-09-24 — End: 2015-09-24

## 2015-09-24 MED ORDER — SODIUM CHLORIDE 0.9 % IV SOLN
250.0000 mL | INTRAVENOUS | Status: DC | PRN
  Administered 2015-09-24: 250 mL via INTRAVENOUS

## 2015-09-24 MED ORDER — CHLORHEXIDINE GLUCONATE 0.12% ORAL RINSE (MEDLINE KIT)
15.0000 mL | Freq: Two times a day (BID) | OROMUCOSAL | Status: DC
  Administered 2015-09-24 (×2): 15 mL via OROMUCOSAL

## 2015-09-24 MED ORDER — FENTANYL CITRATE (PF) 100 MCG/2ML IJ SOLN
12.5000 ug | INTRAMUSCULAR | Status: DC | PRN
  Administered 2015-09-24 (×3): 12.5 ug via INTRAVENOUS
  Filled 2015-09-24 (×3): qty 2

## 2015-09-24 MED ORDER — CETYLPYRIDINIUM CHLORIDE 0.05 % MT LIQD
7.0000 mL | Freq: Two times a day (BID) | OROMUCOSAL | Status: DC
  Administered 2015-09-24 – 2015-09-25 (×3): 7 mL via OROMUCOSAL

## 2015-09-24 MED ORDER — FAMOTIDINE IN NACL 20-0.9 MG/50ML-% IV SOLN
20.0000 mg | Freq: Two times a day (BID) | INTRAVENOUS | Status: DC
  Administered 2015-09-24 – 2015-09-26 (×6): 20 mg via INTRAVENOUS
  Filled 2015-09-24 (×6): qty 50

## 2015-09-24 MED ORDER — LABETALOL HCL 5 MG/ML IV SOLN
20.0000 mg | INTRAVENOUS | Status: DC | PRN
  Filled 2015-09-24: qty 4

## 2015-09-24 NOTE — Progress Notes (Signed)
[place] Progress Note Patient Name: BRENTAN MUTCH DOB: [birth year] MRN: [id number]   Date of Service  09/24/2015  HPI/Events of Note  Camera check on patient postextubation at 10:30 AM this morning. Patient sleeping comfortably on room air. Vital signs normal. Spoke with bedside nurse who reports patient has some mild confusion which is confirmed with family as a chronic issue since early April. Patient has been advanced to a clear liquid diet. Plan is to transition care to hospitalist service starting tomorrow. Notified by nursing staff of no bed in the event of a code/arrest.  eICU Interventions  Transfer patient to a telemetry bed. Placed order for alarm for fall risk. Continue current plan of care.     Intervention Category Major Interventions: Respiratory failure - evaluation and management  Tera Partridge 09/24/2015, 11:34 PM

## 2015-09-24 NOTE — Progress Notes (Signed)
Subjective: Awake but still intubated.  Objective: Vital signs in last 24 hours: Temp:  [96.4 F (35.8 C)-97.8 F (36.6 C)] 97.8 F (36.6 C) (05/01 0800) Pulse Rate:  [75-99] 81 (05/01 1000) Resp:  [0-26] 17 (05/01 1000) BP: (95-211)/(51-121) 128/73 mmHg (05/01 1000) SpO2:  [98 %-100 %] 100 % (05/01 1030) FiO2 (%):  [30 %-100 %] 30 % (05/01 0942) Weight:  [60.1 kg (132 lb 7.9 oz)] 60.1 kg (132 lb 7.9 oz) (05/01 0115) Weight change:  Last BM Date:  (prior to admission, UTA)  PE: GEN:  Intubated, awake, chronic presbyacusis, can nod answers to questions ABD:  Soft, non-tender  Lab Results: CBC    Component Value Date/Time   WBC 14.9* 09/24/2015 0313   RBC 4.57 09/24/2015 0313   HGB 13.3 09/24/2015 0313   HCT 40.6 09/24/2015 0313   PLT 294 09/24/2015 0313   MCV 88.8 09/24/2015 0313   MCH 29.1 09/24/2015 0313   MCHC 32.8 09/24/2015 0313   RDW 13.6 09/24/2015 0313   LYMPHSABS 0.5* 08/30/2015 1533   MONOABS 0.9 08/30/2015 1533   EOSABS 0.0 08/30/2015 1533   BASOSABS 0.0 08/30/2015 1533   CMP     Component Value Date/Time   NA 140 09/24/2015 0313   K 3.7 09/24/2015 0313   CL 105 09/24/2015 0313   CO2 25 09/24/2015 0313   GLUCOSE 141* 09/24/2015 0313   BUN 15 09/24/2015 0313   CREATININE 0.79 09/24/2015 0313   CALCIUM 10.8* 09/24/2015 0313   PROT 7.0 09/24/2015 0313   ALBUMIN 3.7 09/24/2015 0313   AST 25 09/24/2015 0313   ALT 15* 09/24/2015 0313   ALKPHOS 92 09/24/2015 0313   BILITOT 0.3 09/24/2015 0313   GFRNONAA >60 09/24/2015 0313   GFRAA >60 09/24/2015 0313   Assessment:  1.  Food impaction, with esophageal food clearance late last night. 2.  Chronic dysphagia.  Overall constellation of endoscopic findings most consistent with achalasia. 3.  Failure-to-wean off ventilator after last night's endoscopy, triggering admission last night for ventilator weaning.  Plan:  1.  Ventilator weaning per PCCM team. 2.  Once patient is awake, he can have pureed and  blenderized diet.  Patient can not have any solid food, must all be liquified food products. 3.  Patient can return to nursing facility where he resides once intubated, once it is clear what diet he can be on (pureed and blenderized diet only, NO solid food, ONLY liquified food products. 4.  [facility] will follow.   Landry Dyke 09/24/2015, 11:18 AM   Pager [phone] If no answer or after 5 PM call [phone]

## 2015-09-24 NOTE — Anesthesia Postprocedure Evaluation (Signed)
Anesthesia Post Note  Patient: Jack Cantrell  Procedure(s) Performed: Procedure(s) (LRB): ESOPHAGOGASTRODUODENOSCOPY (EGD) (N/A)  Patient location during evaluation: ICU Anesthesia Type: General Level of consciousness: patient remains intubated per anesthesia plan and sedated Pain management: pain level controlled Vital Signs Assessment: post-procedure vital signs reviewed and stable Respiratory status: patient remains intubated per anesthesia plan (Pt is making minimal respiratory effort.  Requires full ventilator support.) Cardiovascular status: stable Anesthetic complications: no Comments: Pt is not responding to stimulus.  Making minimal respiratory effort.  Critical care was consulted and pt was transported to ICU.      Last Vitals:  Filed Vitals:   09/23/15 1945  BP: 112/99  Pulse: 99  Resp: 18    Last Pain: There were no vitals filed for this visit.               [place]

## 2015-09-24 NOTE — Procedures (Signed)
Extubation Procedure Note  Patient Details:   Name: Jack Cantrell DOB: [birth year] MRN: [id number]   Airway Documentation:     Evaluation  O2 sats: [id number] Complications: none Patient tolerated procedure well. Bilateral Breath Sounds: Clear, Diminished   Pt able to speak  Per CCM order, pt extubated and placed on 2L nasal cannula.  Pt tolerated procedure well with no complications. 09/24/2015, 10:30 AM

## 2015-09-24 NOTE — Transfer of Care (Signed)
Immediate Anesthesia Transfer of Care Note  Patient: Jack Cantrell  Procedure(s) Performed: Procedure(s): ESOPHAGOGASTRODUODENOSCOPY (EGD) (N/A)  Patient Location: ICU  Anesthesia Type:General  Level of Consciousness: unresponsive  Airway & Oxygen Therapy: Patient remains intubated per anesthesia plan and Patient placed on Ventilator (see vital sign flow sheet for setting)  Post-op Assessment: Report given to RN, Post -op Vital signs reviewed and stable and BP treated with labetalol in ICU  Post vital signs: Reviewed  Last Vitals:  Filed Vitals:   09/23/15 1945  BP: 112/99  Pulse: 99  Resp: 18    Last Pain: There were no vitals filed for this visit.       Complications: Non responsive postop, minimal if any spontaneous respiratory drive, placed on vent in ICU, VSS

## 2015-09-24 NOTE — Progress Notes (Signed)
2 RNs unable to place OG tube. MD aware.

## 2015-09-24 NOTE — Progress Notes (Signed)
Initial Nutrition Assessment  DOCUMENTATION CODES:   Non-severe (moderate) malnutrition in context of chronic illness  INTERVENTION:  - Diet advancement per GI - RD will continue to monitor for needs prior to d/c  NUTRITION DIAGNOSIS:   Inadequate oral intake related to inability to eat as evidenced by NPO status.   GOAL:   Patient will meet greater than or equal to 90% of their needs  MONITOR:   Diet advancement, PO intake, Weight trends, Labs, Skin, I & O's  REASON FOR ASSESSMENT:   Diagnosis  ASSESSMENT:   59M nursing home resident who presents with recurrent food impaction. He has a history of esophageal dysmotility and underwent EGD with removal of impacted food and medications on 08/31/15. He was sent back to the nursing home on 4/14 with instructions for a dysphagia 2 diet with thin liquids, ground meat and sauces. He completed 7 days of Unasyn that admission for aspiration pneumonia. On 4/29 he was noted to have increased saliva production and regurgitation of food. No nausea per the records. He was evaluated in the ED by GI and taken for urgent endoscopy / disimpaction. He was intubated for the procedure.   Pt previously intubated following EGD 4/30 and was subsequently extubated earlier today. Pt remains NPO at this time and was sleeping at time of RD visit with no family present (although a family member had been present during time of extubation).   RD familiar with pt from admission ~1 month ago with the same presentation/dx. Per GI note today at 1120: Once patient is awake, he can have pureed and blenderized diet. Patient can not have any solid food, must all be liquified food products. Pureed and blenderized diet only, NO solid food, ONLY liquified food products.  Physical assessment to upper body only shows mild fat and moderate muscle wasting. Per chart review, pt has lost 3 lbs (2.2% body weight) in the past 1 month which is not significant for time frame.   Pt  unable to meet needs at this time. Will monitor for diet advancement and associated needs. Medications reviewed. Labs reviewed; Ca: 10.8 mg/dL.    Diet Order:  Diet NPO time specified  Skin:  Wound (see comment) (Stage 1 sacral pressure injury, R arm wound)  Last BM:  PTA  Height:   Ht Readings from Last 1 Encounters:  09/24/15 5\' 3"  (1.6 m)    Weight:   Wt Readings from Last 1 Encounters:  09/24/15 132 lb 7.9 oz (60.1 kg)    Ideal Body Weight:  56.36 kg (kg)  BMI:  Body mass index is 23.48 kg/(m^2).  Estimated Nutritional Needs:   Kcal:  1200-1440 (20-24 kcal/kg)  Protein:  60-72 grams (1-1.2 grams/kg)  Fluid:  >/= 1.5 L/day  EDUCATION NEEDS:   No education needs identified at this time    Jarome Matin, RD, LDN Inpatient Clinical Dietitian Pager # [phone] After hours/weekend pager # [phone]

## 2015-09-24 NOTE — Progress Notes (Signed)
PULMONARY / CRITICAL CARE MEDICINE   Name: Jack Cantrell MRN: [id number] DOB: 03-29-1915    ADMISSION DATE:  09/23/2015 CONSULTATION DATE:   REFERRING MD:  EDP  CHIEF COMPLAINT:  Food impaction  HISTORY OF PRESENT ILLNESS:   Jack Cantrell is a 68M nursing home resident who presents with recurrent food impaction. He has a history of esophageal dysmotility and underwent EGD with removal of impacted food and medications on 08/31/15. He was sent back to the nursing home on 4/14 with instructions for a dysphagia 2 diet with thin liquids, ground meat and sauces. He completed 7 days of Unasyn that admission for aspiration pneumonia. On 4/29 he was noted to have increased saliva production and regurgitation of food. No nausea per the records. He was evaluated in the ED by GI and taken for urgent endoscopy / disimpaction. He was intubated for the procedure. Came back to the OR d/t sedation   SUBJECTIVE:   VITAL SIGNS: BP 128/73 mmHg  Pulse 81  Temp(Src) 97.8 F (36.6 C) (Oral)  Resp 17  Ht 5\' 3"  (1.6 m)  Wt 132 lb 7.9 oz (60.1 kg)  BMI 23.48 kg/m2  SpO2 99%  HEMODYNAMICS:    VENTILATOR SETTINGS: Vent Mode:  [-] SIMV/PC/PS FiO2 (%):  [30 %-100 %] 30 % Set Rate:  [15 bmp] 15 bmp Vt Set:  [460 mL] 460 mL PEEP:  [5 cmH20] 5 cmH20 Pressure Support:  [8 cmH20] 8 cmH20 Plateau Pressure:  [9 [id number] cmH20] 19 cmH20  INTAKE / OUTPUT: I/O last 3 completed shifts: In: 601.7 [I.V.:551.7; IV Piggyback:50] Out: [id number] [Urine:875]  PHYSICAL EXAMINATION: Physical Exam: Temp:  [96.4 F (35.8 C)-97.8 F (36.6 C)] 97.8 F (36.6 C) (05/01 0800) Pulse Rate:  [75-99] 81 (05/01 1000) Resp:  [0-26] 17 (05/01 1000) BP: (95-211)/(51-121) 128/73 mmHg (05/01 1000) SpO2:  [98 %-100 %] 99 % (05/01 1000) FiO2 (%):  [30 %-100 %] 30 % (05/01 0942) Weight:  [132 lb 7.9 oz (60.1 kg)] 132 lb 7.9 oz (60.1 kg) (05/01 0115)  General Well nourished, well developed, no apparent distress on vent   HEENT No gross  abnormalities. Oropharynx clear. Mallampati I. Poor dentition.   Pulmonary Coarse with ronchi bilaterally. No wheezes or ronchi. Vent-assisted effort, symmetrical expansion.   Cardiovascular Normal rate, regular rhythm. S1, s2. Harsh ii/vi systolic murmur with radiation to the carotids. C/w AS No rub or gallop. Distal pulses palpable.  Abdomen Soft, non-tender, non-distended, positive bowel sounds, no palpable organomegaly or masses. Normoresonant to percussion.  Musculoskeletal Good bulk and tone. Bilateral ankles appear to be fused with no range of motion.   Lymphatics No cervical, supraclavicular or axillary adenopathy.   Neurologic Anisocoria noted - may be post-surgical on the left. Minimally responsive right. No spontaneous movements. No response to noxious stimuli. Flaccid throughout except for bilateral ankles, which appear fused.   Skin/Integuement No rash, no cyanosis, no clubbing.      LABS:  BMET  Recent Labs Lab 09/23/15 2019 09/24/15 0313  NA 139 140  K 3.8 3.7  CL 104 105  CO2 26 25  BUN 15 15  CREATININE 0.74 0.79  GLUCOSE 104* 141*    Electrolytes  Recent Labs Lab 09/23/15 2019 09/24/15 0313  CALCIUM 11.3* 10.8*  MG  --  1.9  PHOS  --  2.9    CBC  Recent Labs Lab 09/23/15 2019 09/24/15 0313  WBC 9.4 14.9*  HGB 13.1 13.3  HCT 39.8 40.6  PLT 306 294  Coag's No results for input(s): APTT, INR in the last 168 hours.  Sepsis Markers No results for input(s): LATICACIDVEN, PROCALCITON, O2SATVEN in the last 168 hours.  ABG  Recent Labs Lab 09/24/15 0138  PHART 7.398  PCO2ART 40.6  PO2ART 191*    Liver Enzymes  Recent Labs Lab 09/24/15 0313  AST 25  ALT 15*  ALKPHOS 92  BILITOT 0.3  ALBUMIN 3.7    Cardiac Enzymes No results for input(s): TROPONINI, PROBNP in the last 168 hours.  Glucose No results for input(s): GLUCAP in the last 168 hours.  Imaging Dg Chest 2 View  09/23/2015  CLINICAL DATA:  Dysphagia, vomiting EXAM:  CHEST  2 VIEW COMPARISON:  09/04/2015 FINDINGS: There is elevation of the left diaphragm. There is no focal parenchymal opacity. There is no pleural effusion or pneumothorax. The cardiomediastinal silhouette is normal. The osseous structures are unremarkable. IMPRESSION: No active cardiopulmonary disease. Electronically Signed   By: Kathreen Devoid   On: 09/23/2015 20:20   Ct Head Wo Contrast  09/24/2015  CLINICAL DATA:  Altered mental status.  Pupils are not equal. EXAM: CT HEAD WITHOUT CONTRAST TECHNIQUE: Contiguous axial images were obtained from the base of the skull through the vertex without intravenous contrast. COMPARISON:  08/29/2015 FINDINGS: Diffuse cerebral atrophy. Mild ventricular dilatation consistent with central atrophy. Low-attenuation changes in the deep white matter consistent small vessel ischemia. No mass effect or midline shift. No abnormal extra-axial fluid collections. Gray-white matter junctions are distinct. Basal cisterns are not effaced. No evidence of acute intracranial hemorrhage. No depressed skull fractures. Visualized paranasal sinuses and mastoid air cells are not opacified. Vascular calcifications. IMPRESSION: No acute intracranial abnormalities. Chronic atrophy and small vessel ischemic. Electronically Signed   By: Lucienne Capers M.D.   On: 09/24/2015 02:35   Dg Chest Port 1 View  09/24/2015  CLINICAL DATA:  Respiratory failure. EXAM: PORTABLE CHEST 1 VIEW COMPARISON:  09/23/2015. FINDINGS: Endotracheal tube in 2.5 cm above the carina . Heart size normal. Mild bilateral interstitial prominence. Pneumonitis present this fashion. No pneumothorax. Stable elevation of the left hemidiaphragm. IMPRESSION: 1.  Endotracheal tube tip noted 2.5 cm above the carina. 2. Mild bilateral interstitial prominence. Pneumonitis cannot be excluded. Stable elevation left hemidiaphragm. Heart size stable. Electronically Signed   By: Marcello Moores  Register   On: 09/24/2015 07:17  elevated Left HD,  basilar atx. Can't exclude evolving airspace disease.   STUDIES:  EGD 4/30  CULTURES: None  ANTIBIOTICS: None  SIGNIFICANT EVENTS:   LINES/TUBES: PIV Foley 4/30 ETT 4/30-->5/1  DISCUSSION: Jack Cantrell is a 70M w/ history of esophageal dysmotility (Nutcracker esophagus) and recurrent food impactions who failed to emerge from anesthesia post-procedurally. He is now awake and passed SBT.  He is ready for extubation. His only residual issue is concern for possible aspiration.  Will get sputum culture prior to extubation. F/U am CXR. NPO until he is more awake.   ASSESSMENT / PLAN:  PULMONARY A: Need for mechanical ventilation Concern for aspiration - has basilar atx on film. Could reflect early aspiration P:   Extubate Keep NPO No antibiotics for now  CARDIOVASCULAR A:  Hypertension P:  Likely medication related PRN labetalol and hydralazine for goal SBP <170  RENAL A:   No acute issues P:   Monitor  GASTROINTESTINAL A:   Esophageal dysmotility with food impaction s/p EGD (recurrent) P:   Liquid diet after extubation  Pepcid iv  HEMATOLOGIC A:   No acute issues P:  Monitor  INFECTIOUS A:  No acute issues Concern for aspiration P:   No antibiotics for now Ck sputum & PCt ENDOCRINE A:   No acute issues P:   Monitor  NEUROLOGIC A:   Altered mental status / unresponsiveness post procedure - likely related to medications-->improved Anisocoria / minimally reactive pupils - unclear chronicity and etiology P:   RASS goal: 0 D/c sedation    FAMILY  - Updates: No family available; per records from last admission and nursing report, patient has a DNR  - Inter-disciplinary family meet or Palliative Care meeting due by:  day [place] ACNP-BC [place] Pager # [phone] OR # [phone] if no answer   09/24/2015, 10:08 AM

## 2015-09-24 NOTE — H&P (Addendum)
PULMONARY / CRITICAL CARE MEDICINE   Name: Jack Cantrell MRN: [id number] DOB: [birth year]    ADMISSION DATE:  09/23/2015 CONSULTATION DATE:   REFERRING MD:  EDP  CHIEF COMPLAINT:  Food impaction  HISTORY OF PRESENT ILLNESS:   Jack Cantrell is a 89M nursing home resident who presents with recurrent food impaction. He has a history of esophageal dysmotility and underwent EGD with removal of impacted food and medications on 08/31/15. He was sent back to the nursing home on 4/14 with instructions for a dysphagia 2 diet with thin liquids, ground meat and sauces. He completed 7 days of Unasyn that admission for aspiration pneumonia. On 4/29 he was noted to have increased saliva production and regurgitation of food. No nausea per the records. He was evaluated in the ED by GI and taken for urgent endoscopy / disimpaction. He was intubated for the procedure. From the anesthesia records, it appears he was given succinylcholine and propofol for induction, then maintained with desflurane. He also got LR, labetalol and ondasetron. Post-procedure, the patient continued to be unresponsive with minimal respiratory drive. He was left intubated and brought to the ICU. His pupils were noted to be asymmetric and minimally reactive. The chronicity of this is unclear.  He is known to be hard of hearing and wears hearing aids. No spontaneous movements. He is synchronous with the ventilator. His son reportedly went home.   PAST MEDICAL HISTORY :  He  has a past medical history of Esophageal abnormality; Gout; and Cancer ([place]).  PAST SURGICAL HISTORY: He  has past surgical history that includes Joint replacement and Esophagogastroduodenoscopy (egd) with propofol (N/A, 08/31/2015).  No Known Allergies  No current facility-administered medications on file prior to encounter.   Current Outpatient Prescriptions on File Prior to Encounter  Medication Sig  . allopurinol (ZYLOPRIM) 300 MG tablet Take 300 mg by mouth daily.  Marland Kitchen  aspirin EC 81 MG tablet Take 81 mg by mouth daily.  Marland Kitchen donepezil (ARICEPT) 5 MG tablet Take 5 mg by mouth daily.   . finasteride (PROSCAR) 5 MG tablet Take 5 mg by mouth daily.  . hyoscyamine (LEVBID) 0.375 MG 12 hr tablet Take 0.375 mg by mouth 3 (three) times daily. Cramping  . omeprazole (PRILOSEC) 40 MG capsule Take 40 mg by mouth daily.    FAMILY HISTORY:  His has no family status information on file.   SOCIAL HISTORY: He  reports that he has quit smoking. He does not have any smokeless tobacco history on file. He reports that he does not drink alcohol.  REVIEW OF SYSTEMS:   Unable to obtain 2/2 mental status and intubated state  SUBJECTIVE:    VITAL SIGNS: BP 112/99 mmHg  Pulse 99  Resp 18  SpO2 100%  HEMODYNAMICS:    VENTILATOR SETTINGS: Vent Mode:  [-] PRVC FiO2 (%):  [100 %] 100 % Set Rate:  [15 bmp] 15 bmp Vt Set:  [460 mL] 460 mL PEEP:  [5 cmH20] 5 cmH20 Plateau Pressure:  [21 cmH20] 21 cmH20  INTAKE / OUTPUT:    PHYSICAL EXAMINATION: Physical Exam: Pulse Rate:  [99] 99 (04/30 1945) Resp:  [18] 18 (04/30 1945) BP: (112)/(99) 112/99 mmHg (04/30 1945) SpO2:  [99 %-100 %] 100 % (05/01 0031) FiO2 (%):  [60 %-100 %] 60 % (05/01 0109)  General Well nourished, well developed, no apparent distress  HEENT No gross abnormalities. Oropharynx clear. Mallampati I. Poor dentition.   Pulmonary Coarse with ronchi bilaterally. No wheezes or ronchi. Vent-assisted effort,  symmetrical expansion.   Cardiovascular Normal rate, regular rhythm. S1, s2. Harsh ii/vi systolic murmur with radiation to the carotids. No rub or gallop. Distal pulses palpable.  Abdomen Soft, non-tender, non-distended, positive bowel sounds, no palpable organomegaly or masses. Normoresonant to percussion.  Musculoskeletal Good bulk and tone. Bilateral ankles appear to be fused with no range of motion.   Lymphatics No cervical, supraclavicular or axillary adenopathy.   Neurologic Anisocoria noted - may  be post-surgical on the left. Minimally responsive right. No spontaneous movements. No response to noxious stimuli. Flaccid throughout except for bilateral ankles, which appear fused.   Skin/Integuement No rash, no cyanosis, no clubbing.      LABS:  BMET  Recent Labs Lab 09/23/15 2019  NA 139  K 3.8  CL 104  CO2 26  BUN 15  CREATININE 0.74  GLUCOSE 104*    Electrolytes  Recent Labs Lab 09/23/15 2019  CALCIUM 11.3*    CBC  Recent Labs Lab 09/23/15 2019  WBC 9.4  HGB 13.1  HCT 39.8  PLT 306    Coag's No results for input(s): APTT, INR in the last 168 hours.  Sepsis Markers No results for input(s): LATICACIDVEN, PROCALCITON, O2SATVEN in the last 168 hours.  ABG No results for input(s): PHART, PCO2ART, PO2ART in the last 168 hours.  Liver Enzymes No results for input(s): AST, ALT, ALKPHOS, BILITOT, ALBUMIN in the last 168 hours.  Cardiac Enzymes No results for input(s): TROPONINI, PROBNP in the last 168 hours.  Glucose No results for input(s): GLUCAP in the last 168 hours.  Imaging Dg Chest 2 View  09/23/2015  CLINICAL DATA:  Dysphagia, vomiting EXAM: CHEST  2 VIEW COMPARISON:  09/04/2015 FINDINGS: There is elevation of the left diaphragm. There is no focal parenchymal opacity. There is no pleural effusion or pneumothorax. The cardiomediastinal silhouette is normal. The osseous structures are unremarkable. IMPRESSION: No active cardiopulmonary disease. Electronically Signed   By: Kathreen Devoid   On: 09/23/2015 20:20     STUDIES:  EGD 4/30  CULTURES: None  ANTIBIOTICS: None  SIGNIFICANT EVENTS:   LINES/TUBES: PIV Foley 4/30 ETT 4/30  DISCUSSION: Jack Cantrell is a 22M w/ history of esophageal dysmotility (Nutcracker esophagus) and recurrent food impactions who failed to emerge from anesthesia post-procedurally. He remains intubated and currently is unresponsive. He did receive succinylcholine for intubation and desflurane during the procedure,  both of which he did NOT receive during his last EGD. He is markedly hypertensive and required labetalol during the case as well. His anisocoria appears chronic from review of the notes, but his pupils are minimally responsive.  ASSESSMENT / PLAN:  PULMONARY A: Need for mechanical ventilation Concern for aspiration - no infiltrate as yet on CXR P:   Continue ventilatory support Wean as tolerated as mental status improves SBT when able No antibiotics for now  CARDIOVASCULAR A:  Hypertension P:  Likely medication related PRN labetalol and hydralazine for goal SBP <170 Ensure adequate analgesia  RENAL A:   No acute issues P:   Monitor  GASTROINTESTINAL A:   Esophageal dysmotility with food impaction s/p EGD P:   NPO for now Pureed diet only when able to tolerate PO Pepcid iv  HEMATOLOGIC A:   No acute issues P:  Monitor  INFECTIOUS A:   No acute issues Concern for aspiration P:   No antibiotics for now  ENDOCRINE A:   No acute issues P:   Monitor  NEUROLOGIC A:   Altered mental status / unresponsiveness post  procedure - likely related to medications Anisocoria / minimally reactive pupils - unclear chronicity and etiology P:   RASS goal: 0 Given marked hypertension during and after the procedure, obtain CT head to r/o acute bleed Frequent neuro checks   FAMILY  - Updates: No family available; per records from last admission and nursing report, patient has a DNR  - Inter-disciplinary family meet or Palliative Care meeting due by:  day 7  The patient is critically ill with multiple organ system failure and requires high complexity decision making for assessment and support, frequent evaluation and titration of therapies, advanced monitoring, review of radiographic studies and interpretation of complex data.   Critical Care Time devoted to patient care services, exclusive of separately billable procedures, described in this note is 42 minutes.    Yisroel Ramming, MD Pulmonary and [place] Pager: [phone]  09/24/2015, 1:02 AM

## 2015-09-24 NOTE — Progress Notes (Signed)
[facility] Progress Note Patient Name: Jack Cantrell DOB: Aug 05, 1914 MRN: [id number]   Date of Service  09/24/2015  HPI/Events of Note  Contacted by emergency department physician as well as anesthesia. Patient status post endoscopic clearing of food impaction. Continues to remain sedated after procedure requiring  Endotracheal intubation. Also note patient did have nausea and vomiting prior to intubation with questionable aspiration.  eICU Interventions  Plan for PCCM assessment ARRIVAL IN THE INTENSIVE CARE UNIT.     Intervention Category Major Interventions: Respiratory failure - evaluation and management;Change in mental status - evaluation and management  Tera Partridge 09/24/2015, 12:25 AM

## 2015-09-24 NOTE — Care Management Note (Signed)
Case Management Note  Patient Details  Name: Jack Cantrell MRN: [id number] Date of Birth: 1914-11-17  Subjective/Objective:       Food impaction and respiratory failure post procedure-intubated             Action/Plan:Date:  Sep 24, 2015 Chart reviewed for concurrent status and case management needs. Will continue to follow patient for changes and needs: Velva Harman, BSN, [place], [place]   Expected Discharge Date:                  Expected Discharge Plan:  [place]  In-House Referral:  Clinical Social Work  Discharge planning Services  CM Consult  Post Acute Care Choice:  NA Choice offered to:  NA  DME Arranged:    DME Agency:     HH Arranged:    [place] Agency:     Status of Service:  In process, will continue to follow  Medicare Important Message Given:    Date Medicare IM Given:    Medicare IM give by:    Date Additional Medicare IM Given:    Additional Medicare Important Message give by:     If discussed at [place] of Stay Meetings, dates discussed:    Additional Comments:  Leeroy Cha, RN 09/24/2015, 10:47 AM

## 2015-09-24 NOTE — Op Note (Signed)
[facility] Patient Name: Jack Cantrell Procedure Date: 09/23/2015 MRN: [id number] Attending MD: Arta Silence , MD Date of Birth: 11/06/1914 CSN:  Age: 82 Admit Type: Emergency Department Procedure:                Upper GI endoscopy Indications:              Foreign body in the esophagus Providers:                Arta Silence, MD Referring MD:              Medicines:                General Anesthesia Complications:            No immediate complications. Estimated Blood Loss:     Estimated blood loss was minimal. Procedure:                Pre-Anesthesia Assessment:                           - Prior to the procedure, a History and Physical                            was performed, and patient medications and                            allergies were reviewed. The patient's tolerance of                            previous anesthesia was also reviewed. The risks                            and benefits of the procedure and the sedation                            options and risks were discussed with the son,                            Jie Mccoin, who provided consent. All questions                            were answered, and informed consent was obtained.                            Prior Anticoagulants: The patient has taken                            aspirin. ASA Grade Assessment: III - A patient with                            severe systemic disease. After reviewing the risks                            and benefits, the patient was deemed in  satisfactory condition to undergo the procedure.                           After obtaining informed consent, the endoscope was                            passed under direct vision. Throughout the                            procedure, the patient's blood pressure, pulse, and                            oxygen saturations were monitored continuously. The                            Endoscope was  introduced through the and advanced                            to the fundus of the stomach. The upper GI                            endoscopy was accomplished without difficulty. The                            patient tolerated the procedure well. Findings:      Food was found in the entire esophagus. Removal of food was accomplished.      The lumen of the esophagus was moderately dilated.      No appreciable esophageal motility was noted. In addition, a hypertonic       lower esophageal sphincter was found. There was moderate resistance to       endoscope advancement into the stomach. The Z-line was regular.      One moderate benign-appearing, intrinsic stenosis was found. Did not       pass endoscope past fundus, given moderate resistance at level of GE       junction.      The gastric fundus was normal. Impression:               - Food in the esophagus. Removal was successful.                           - Dilation in the entire esophagus.                           - The esophageal examination was consistent with                            achalasia.                           - Benign-appearing esophageal stenosis.                           - Normal gastric fundus. Moderate Sedation:      N/A- Per Anesthesia Care Recommendation:           - Observe  patient in ICU for ongoing care, to                            facilitate assistance with ventilator weaning.                           - NPO until further notice. Once patient is awake                            and extubated, he can had pureed diet ONLY.                           - Patient medication history reviewed. Patient is                            appropriately not taking any medications.                           - Return to GI clinic PRN.                           [facility] will follow.                           - Return to referring physician as previously                            scheduled. Procedure Code(s):        ---  Professional ---                           [phone], Esophagoscopy, flexible, transoral; with                            removal of foreign body(s) Diagnosis Code(s):        --- Professional ---                           [id number], Food in esophagus causing other injury,                            initial encounter                           K22.8, Other specified diseases of esophagus                           K22.2, Esophageal obstruction                           T18.108A, Unspecified foreign body in esophagus                            causing other injury, initial encounter CPT copyright 2016 American Medical Association. All rights reserved. The codes documented in this report are preliminary and upon coder review may  be revised to meet current  compliance requirements. Arta Silence, MD 09/24/2015 12:21:44 AM This report has been signed electronically. Number of Addenda: 0

## 2015-09-24 NOTE — Progress Notes (Signed)
[facility] Progress Note Patient Name: Jack Cantrell DOB: 22-Sep-1914 MRN: [id number]   Date of Service  09/24/2015  HPI/Events of Note  Patient now extubated and is more awake. Plan was to advance diet to clear liquids when more awake. He has has ice chips and apple juice and tolerated both just fine.   eICU Interventions  Advance diet to clear liquids.      Intervention Category Minor Interventions: Routine modifications to care plan (e.g. PRN medications for pain, fever)  Sommer,Steven Cornelia Copa 09/24/2015, 5:27 PM

## 2015-09-25 ENCOUNTER — Encounter (HOSPITAL_COMMUNITY): Payer: Self-pay | Admitting: Gastroenterology

## 2015-09-25 ENCOUNTER — Inpatient Hospital Stay (HOSPITAL_COMMUNITY): Payer: Medicare Other

## 2015-09-25 DIAGNOSIS — J9600 Acute respiratory failure, unspecified whether with hypoxia or hypercapnia: Secondary | ICD-10-CM

## 2015-09-25 DIAGNOSIS — I10 Essential (primary) hypertension: Secondary | ICD-10-CM

## 2015-09-25 DIAGNOSIS — G9340 Encephalopathy, unspecified: Secondary | ICD-10-CM

## 2015-09-25 DIAGNOSIS — K220 Achalasia of cardia: Secondary | ICD-10-CM

## 2015-09-25 LAB — COMPREHENSIVE METABOLIC PANEL
ALK PHOS: 74 U/L (ref 38–126)
ALT: 12 U/L — AB (ref 17–63)
AST: 20 U/L (ref 15–41)
Albumin: 3.2 g/dL — ABNORMAL LOW (ref 3.5–5.0)
Anion gap: 7 (ref 5–15)
BILIRUBIN TOTAL: 0.6 mg/dL (ref 0.3–1.2)
BUN: 16 mg/dL (ref 6–20)
CALCIUM: 10.8 mg/dL — AB (ref 8.9–10.3)
CHLORIDE: 107 mmol/L (ref 101–111)
CO2: 28 mmol/L (ref 22–32)
CREATININE: 0.86 mg/dL (ref 0.61–1.24)
GFR calc Af Amer: >60 mL/min (ref >60)
Glucose, Bld: 94 mg/dL (ref 65–99)
Potassium: 4.3 mmol/L (ref 3.5–5.1)
Sodium: 142 mmol/L (ref 135–145)
TOTAL PROTEIN: 6.3 g/dL — AB (ref 6.5–8.1)

## 2015-09-25 LAB — CBC
HEMATOCRIT: 36 % — AB (ref 39.0–52.0)
HEMOGLOBIN: 11.7 g/dL — AB (ref 13.0–17.0)
MCH: 29.2 pg (ref 26.0–34.0)
MCHC: 32.5 g/dL (ref 30.0–36.0)
MCV: 89.8 fL (ref 78.0–100.0)
Platelets: 244 10*3/uL (ref 150–400)
RBC: 4.01 MIL/uL — AB (ref 4.22–5.81)
RDW: 13.9 % (ref 11.5–15.5)
WBC: 10.1 10*3/uL (ref 4.0–10.5)

## 2015-09-25 MED ORDER — LACTATED RINGERS IV SOLN: INTRAVENOUS | Status: DC

## 2015-09-25 NOTE — Progress Notes (Signed)
CSW received consult that patient was admitted from [facility]. CSW called & confirmed with patient's daughter-in-law (ph#: [phone]) that patient plans to return to [facility] to finish rehab stay and ultimately go back to [facility]. CSW confirmed with Lexine Baton at [facility] that they would be able to take patient back when ready.   CSW has completed FL2 & will continue to follow and assist with return.    Raynaldo Opitz, [facility] Clinical Social Worker cell #: [phone]

## 2015-09-25 NOTE — Clinical Documentation Improvement (Signed)
Critical Care Gastroenterology  Pending completion of respiratory culture please document possible Clinical Conditions associated with below indicators:  Abnormal Lab/Test Results:  TRACHEAL ASPIRATE    Special Requests NONE   Gram Stain ABUNDANT WBC PRESENT,BOTH PMN AND MONONUCLEAR  RARE SQUAMOUS EPITHELIAL CELLS PRESENT  ABUNDANT GRAM POSITIVE COCCI  IN PAIRS IN CLUSTERS FEW GRAM NEGATIVE RODS  Performed at [facility]       Culture PENDING          Possible Clinical Conditions associated with below indicators   Aspiration PNA Other Condition Cannot Clinically Determine   Supporting Information: ELink note 09/24/15 "Also note patient did have nausea and vomiting prior to intubation with questionable & aspiration"    Evaluation: sent to ICU post procedure for monitoring     Please exercise your independent, professional judgment when responding. A specific answer is not anticipated or expected.   Thank You,  [place] [phone]

## 2015-09-25 NOTE — NC FL2 (Signed)
  [place] LEVEL OF CARE SCREENING TOOL     IDENTIFICATION  Patient Name: Jack Cantrell Birthdate: 1914/08/01 Sex: male Admission Date (Current Location): 09/23/2015  [facility] and [place] Number:  Herbalist and Address:  [facility],  Seth [address] [place]      Provider Number: [id number]  Attending Physician Name and Address:  Cristal Ford, DO  Relative Name and Phone Number:       Current Level of Care: Hospital Recommended Level of Care: [place] Prior Approval Number:    Date Approved/Denied:   PASRR Number: [id number]  Discharge Plan: Home    Current Diagnoses: Patient Active Problem List   Diagnosis Date Noted  . Acute respiratory failure ([place]) 09/24/2015  . Malnutrition of moderate degree 09/24/2015  . Dysphagia 09/16/2015  . Arm swelling 09/16/2015  . Gout 09/16/2015  . Pressure ulcer 08/31/2015  . BPH (benign prostatic hypertrophy) 08/31/2015  . Dementia without behavioral disturbance 08/31/2015  . 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 Sepsis ([place]) 08/31/2015  . Pneumonia 08/30/2015  . CAP (community acquired pneumonia) 08/30/2015    Orientation RESPIRATION BLADDER Height & Weight     Self, Time    Continent Weight: 134 lb 0.6 oz (60.8 kg) Height:  5\' 3"  (160 cm)  BEHAVIORAL SYMPTOMS/MOOD NEUROLOGICAL BOWEL NUTRITION STATUS      Continent Diet (Dysphasia 1 Diet)  AMBULATORY STATUS COMMUNICATION OF NEEDS Skin   Extensive Assist Verbally PU Stage and Appropriate Care, Surgical wounds (PressureUlcer04/06/17StageI-Intactskinwithnon-blanchablerednessofalocalizedareausuallyoverabonyprominence.nonblanchingreddnesswithdimesizedwoundarea& Wound/Incision(OpenorDehisced)04/07/17Other(Comment)ArmRight;Late)                       Personal Care Assistance Level of Assistance  Bathing, Feeding, Dressing Bathing Assistance: Limited assistance Feeding  assistance: Independent Dressing Assistance: Limited assistance     Functional Limitations Info             SPECIAL CARE FACTORS FREQUENCY        PT Frequency: 5 OT Frequency: 5            Contractures      Additional Factors Info  Code Status, Allergies Code Status Info: DNR Allergies Info: NKDA           Current Medications (09/25/2015):  This is the current hospital active medication list Current Facility-Administered Medications  Medication Dose Route Frequency Provider Last Rate Last Dose  . 0.9 %  sodium chloride infusion  250 mL Intravenous PRN Dannielle Burn, MD 10 mL/hr at 09/24/15 0150 250 mL at 09/24/15 0150  . antiseptic oral rinse (CPC / CETYLPYRIDINIUM CHLORIDE 0.05%) solution 7 mL  7 mL Mouth Rinse BID Brand Males, MD   7 mL at 09/25/15 1054  . famotidine (PEPCID) IVPB 20 mg premix  20 mg Intravenous Q12H Dannielle Burn, MD   20 mg at 09/25/15 1054  . hydrALAZINE (APRESOLINE) injection 10-40 mg  10-40 mg Intravenous Q4H PRN Dannielle Burn, MD   10 mg at 09/24/15 0142  . labetalol (NORMODYNE,TRANDATE) injection 20 mg  20 mg Intravenous Q10 min PRN Dannielle Burn, MD         Discharge Medications: Please see discharge summary for a list of discharge medications.  Relevant Imaging Results:  Relevant Lab Results:   Additional Information SSN: [id number]  Standley Brooking, LCSW

## 2015-09-25 NOTE — Progress Notes (Signed)
Addendum completed for endoscopy case to move information to OR case.

## 2015-09-25 NOTE — Progress Notes (Signed)
Subjective: Extubated yesterday.  Objective: Vital signs in last 24 hours: Temp:  [97.6 F (36.4 C)-98.7 F (37.1 C)] 98 F (36.7 C) (05/02 0631) Pulse Rate:  [73-92] 73 (05/02 0631) Resp:  [16-25] 16 (05/02 0631) BP: (78-148)/(43-90) 148/90 mmHg (05/02 0631) SpO2:  [95 %-100 %] 98 % (05/02 0631) FiO2 (%):  [30 %] 30 % (05/01 0942) Weight:  [60.8 kg (134 lb 0.6 oz)] 60.8 kg (134 lb 0.6 oz) (05/02 0644) Weight change: 0.7 kg (1 lb 8.7 oz) Last BM Date:  (prior to admission )  PE: GEN:  Elderly, awake but confused  Lab Results: CBC    Component Value Date/Time   WBC 10.1 09/25/2015 0519   RBC 4.01* 09/25/2015 0519   HGB 11.7* 09/25/2015 0519   HCT 36.0* 09/25/2015 0519   PLT 244 09/25/2015 0519   MCV 89.8 09/25/2015 0519   MCH 29.2 09/25/2015 0519   MCHC 32.5 09/25/2015 0519   RDW 13.9 09/25/2015 0519   LYMPHSABS 0.5* 08/30/2015 1533   MONOABS 0.9 08/30/2015 1533   EOSABS 0.0 08/30/2015 1533   BASOSABS 0.0 08/30/2015 1533   CMP     Component Value Date/Time   NA 142 09/25/2015 0519   K 4.3 09/25/2015 0519   CL 107 09/25/2015 0519   CO2 28 09/25/2015 0519   GLUCOSE 94 09/25/2015 0519   BUN 16 09/25/2015 0519   CREATININE 0.86 09/25/2015 0519   CALCIUM 10.8* 09/25/2015 0519   PROT 6.3* 09/25/2015 0519   ALBUMIN 3.2* 09/25/2015 0519   AST 20 09/25/2015 0519   ALT 12* 09/25/2015 0519   ALKPHOS 74 09/25/2015 0519   BILITOT 0.6 09/25/2015 0519   GFRNONAA >60 09/25/2015 0519   GFRAA >60 09/25/2015 0519   Assessment:   1. Food impaction, with esophageal food clearance late last night. 2. Chronic dysphagia. Overall constellation of endoscopic findings most consistent with achalasia. 3. Failure-to-wean off ventilator post-EGD.  Has been extubated successfully.  Plan:  1.  Pureed, blenderized diet only.  NO solid component to food, indefinitely. 2.  Will sign-off; patient's son, Jack Cantrell, was instructed to call our office if any future troubles arise. 3.   Thank you for the consultation; please call with questions.   Landry Dyke 09/25/2015, 9:34 AM   Pager [phone] If no answer or after 5 PM call [phone]

## 2015-09-25 NOTE — Progress Notes (Signed)
PROGRESS NOTE    Jack Cantrell  [id number] DOB: December 07, 1914 DOA: 09/23/2015 PCP: Kirk Ruths., MD  Outpatient Specialists:   Brief Narrative:  HPI on 09/24/2015 by Dr. Germain Osgood (PCCM) Jack Cantrell is a 68M nursing home resident who presents with recurrent food impaction. He has a history of esophageal dysmotility and underwent EGD with removal of impacted food and medications on 08/31/15. He was sent back to the nursing home on 4/14 with instructions for a dysphagia 2 diet with thin liquids, ground meat and sauces. He completed 7 days of Unasyn that admission for aspiration pneumonia. On 4/29 he was noted to have increased saliva production and regurgitation of food. No nausea per the records. He was evaluated in the ED by GI and taken for urgent endoscopy / disimpaction. He was intubated for the procedure. From the anesthesia records, it appears he was given succinylcholine and propofol for induction, then maintained with desflurane. He also got LR, labetalol and ondasetron. Post-procedure, the patient continued to be unresponsive with minimal respiratory drive. He was left intubated and brought to the ICU. His pupils were noted to be asymmetric and minimally reactive. The chronicity of this is unclear.  He is known to be hard of hearing and wears hearing aids. No spontaneous movements. He is synchronous with the ventilator. His son reportedly went home.   Assessment & Plan   Acute respiratory failure secondary to food impaction -Patient required intubation and was extubated on 09/24/2015 -Currently on no antibiotics -There was concern for aspiration, however, CXR 5/1: persistent mild B/L interstitial prominence, mild pneumonitis cannot be excluded. -Currently, patient is afebrile with no leukocytosis   Esophageal dysmotility with food impaction/Achalasia -Seems to be recurrent problem -Gastroenterology consulted and appreciated -s/p EGD: food in the esophagus, removal was  successful. Dilation in the entire esophagus. Examination consistent with achalasia -Recommended pured, blenderized diet only. No solid component to food indefinitely  Acute encephalopathy with unresponsiveness -Likely secondary to the above. -Patient hard of hearing. -Spoke to son via phone, patient needs to be sitting up when he speaks.  A similar episode occurred a fews ago after receiving anesthesia.   -Currently awake  Essential Hypertension -Cotninue hydralazine and labetolol PRN  DVT Prophylaxis  SCDs  Code Status: DNR  Family Communication: None at bedside. Son via phone  Disposition Plan: Admitted. Possibly discharge to SNF ([facility]) within 24-48hours  Consultants Gastroenterology PCCM  Procedures  EGD Intubation/Extubatin  Antibiotics   Anti-infectives    None      Subjective:   Jack Cantrell seen and examined today.  Patient currently does not make sense.  Per son, patient has had an episode like this previously.  He needs to be sitting up when he speaks.  He gets confused when he is out of normal routine.   Objective:   Filed Vitals:   09/25/15 0003 09/25/15 0125 09/25/15 0631 09/25/15 0644  BP: 116/51 140/64 148/90   Pulse:  76 73   Temp: 98.7 F (37.1 C) 98.5 F (36.9 C) 98 F (36.7 C)   TempSrc: Axillary Axillary Oral   Resp: 16 20 16    Height:      Weight:    60.8 kg (134 lb 0.6 oz)  SpO2: 95% 98% 98%     Intake/Output Summary (Last 24 hours) at 09/25/15 1237 Last data filed at 09/25/15 1052  Gross per 24 hour  Intake    530 ml  Output    440 ml  Net  90 ml   Filed Weights   09/24/15 0115 09/25/15 0644  Weight: 60.1 kg (132 lb 7.9 oz) 60.8 kg (134 lb 0.6 oz)    Exam  General: Well developed, elderly, NAD  HEENT: NCAT, mucous membranes moist. Poor denition   Cardiovascular: S1 S2 auscultated, 2/6SEM, Regular rate and rhythm.  Respiratory: Course breath sounds.   Abdomen: Soft, nontender, nondistended, + bowel  sounds  Extremities: warm dry without cyanosis clubbing or edema  Neuro: anisocoria (left), right pupil minimally responsive. Speech slurred and nonsensical. Hard of hearing  Data Reviewed: I have personally reviewed following labs and imaging studies  CBC:  Recent Labs Lab 09/23/15 2019 09/24/15 0313 09/25/15 0519  WBC 9.4 14.9* 10.1  HGB 13.1 13.3 11.7*  HCT 39.8 40.6 36.0*  MCV 88.6 88.8 89.8  PLT 306 294 [id number]   Basic Metabolic Panel:  Recent Labs Lab 09/23/15 2019 09/24/15 0313 09/25/15 0519  NA 139 140 142  K 3.8 3.7 4.3  CL 104 105 107  CO2 26 25 28   GLUCOSE 104* 141* 94  BUN 15 15 16   CREATININE 0.74 0.79 0.86  CALCIUM 11.3* 10.8* 10.8*  MG  --  1.9  --   PHOS  --  2.9  --    GFR: Estimated Creatinine Clearance: 37.7 mL/min (by C-G formula based on Cr of 0.86). Liver Function Tests:  Recent Labs Lab 09/24/15 0313 09/25/15 0519  AST 25 20  ALT 15* 12*  ALKPHOS 92 74  BILITOT 0.3 0.6  PROT 7.0 6.3*  ALBUMIN 3.7 3.2*   No results for input(s): LIPASE, AMYLASE in the last 168 hours. No results for input(s): AMMONIA in the last 168 hours. Coagulation Profile: No results for input(s): INR, PROTIME in the last 168 hours. Cardiac Enzymes: No results for input(s): CKTOTAL, CKMB, CKMBINDEX, TROPONINI in the last 168 hours. BNP (last 3 results) No results for input(s): PROBNP in the last 8760 hours. HbA1C: No results for input(s): HGBA1C in the last 72 hours. CBG:  Recent Labs Lab 09/24/15 1110 09/24/15 1112 09/24/15 1302  GLUCAP 106* 85 114*   Lipid Profile: No results for input(s): CHOL, HDL, LDLCALC, TRIG, CHOLHDL, LDLDIRECT in the last 72 hours. Thyroid Function Tests: No results for input(s): TSH, T4TOTAL, FREET4, T3FREE, THYROIDAB in the last 72 hours. Anemia Panel: No results for input(s): VITAMINB12, FOLATE, FERRITIN, TIBC, IRON, RETICCTPCT in the last 72 hours. Urine analysis:    Component Value Date/Time   COLORURINE YELLOW  09/04/2015 [place] 09/04/2015 0939   LABSPEC 1.030 09/04/2015 0939   PHURINE 6.0 09/04/2015 0939   GLUCOSEU NEGATIVE 09/04/2015 0939   HGBUR NEGATIVE 09/04/2015 0939   BILIRUBINUR NEGATIVE 09/04/2015 0939   KETONESUR NEGATIVE 09/04/2015 0939   PROTEINUR 30* 09/04/2015 0939   NITRITE NEGATIVE 09/04/2015 0939   LEUKOCYTESUR NEGATIVE 09/04/2015 0939   Sepsis Labs: @LABRCNTIP (procalcitonin:4,lacticidven:4)  ) Recent Results (from the past 240 hour(s))  MRSA PCR Screening     Status: None   Collection Time: 09/23/15  6:53 PM  Result Value Ref Range Status   MRSA by PCR NEGATIVE NEGATIVE Final    Comment:        The GeneXpert MRSA Assay (FDA approved for NASAL specimens only), is one component of a comprehensive MRSA colonization surveillance program. It is not intended to diagnose MRSA infection nor to guide or monitor treatment for MRSA infections.   Culture, respiratory (NON-Expectorated)     Status: None (Preliminary result)   Collection Time: 09/24/15 10:26  AM  Result Value Ref Range Status   Specimen Description TRACHEAL ASPIRATE  Final   Special Requests NONE  Final   Gram Stain   Final    ABUNDANT WBC PRESENT,BOTH PMN AND MONONUCLEAR RARE SQUAMOUS EPITHELIAL CELLS PRESENT ABUNDANT GRAM POSITIVE COCCI IN PAIRS IN CLUSTERS FEW GRAM NEGATIVE RODS Performed at [facility]    Culture PENDING  Incomplete   Report Status PENDING  Incomplete      Radiology Studies: Dg Chest 2 View  09/23/2015  CLINICAL DATA:  Dysphagia, vomiting EXAM: CHEST  2 VIEW COMPARISON:  09/04/2015 FINDINGS: There is elevation of the left diaphragm. There is no focal parenchymal opacity. There is no pleural effusion or pneumothorax. The cardiomediastinal silhouette is normal. The osseous structures are unremarkable. IMPRESSION: No active cardiopulmonary disease. Electronically Signed   By: Kathreen Devoid   On: 09/23/2015 20:20   Ct Head Wo Contrast  09/24/2015  CLINICAL  DATA:  Altered mental status.  Pupils are not equal. EXAM: CT HEAD WITHOUT CONTRAST TECHNIQUE: Contiguous axial images were obtained from the base of the skull through the vertex without intravenous contrast. COMPARISON:  08/29/2015 FINDINGS: Diffuse cerebral atrophy. Mild ventricular dilatation consistent with central atrophy. Low-attenuation changes in the deep white matter consistent small vessel ischemia. No mass effect or midline shift. No abnormal extra-axial fluid collections. Gray-white matter junctions are distinct. Basal cisterns are not effaced. No evidence of acute intracranial hemorrhage. No depressed skull fractures. Visualized paranasal sinuses and mastoid air cells are not opacified. Vascular calcifications. IMPRESSION: No acute intracranial abnormalities. Chronic atrophy and small vessel ischemic. Electronically Signed   By: Lucienne Capers M.D.   On: 09/24/2015 02:35   Dg Chest Port 1 View  09/25/2015  CLINICAL DATA:  Pneumonia. EXAM: PORTABLE CHEST 1 VIEW COMPARISON:  09/24/2015.  09/23/2015. FINDINGS: Mediastinum and hilar structures are normal . Mild bilateral interstitial prominence again noted. Mild pneumonitis cannot be excluded . Left lower lobe subsegmental atelectasis and or infiltrate. No pleural effusion or pneumothorax. Elevation left hemidiaphragm. IMPRESSION: 1. Persistent mild bilateral interstitial prominence, mild pneumonitis cannot be excluded. 2. Left lower lobe subsegmental atelectasis. Elevation left hemidiaphragm. Electronically Signed   By: Marcello Moores  Register   On: 09/25/2015 07:17   Dg Chest Port 1 View  09/24/2015  CLINICAL DATA:  Respiratory failure. EXAM: PORTABLE CHEST 1 VIEW COMPARISON:  09/23/2015. FINDINGS: Endotracheal tube in 2.5 cm above the carina . Heart size normal. Mild bilateral interstitial prominence. Pneumonitis present this fashion. No pneumothorax. Stable elevation of the left hemidiaphragm. IMPRESSION: 1.  Endotracheal tube tip noted 2.5 cm above the  carina. 2. Mild bilateral interstitial prominence. Pneumonitis cannot be excluded. Stable elevation left hemidiaphragm. Heart size stable. Electronically Signed   By: [place]   On: 09/24/2015 07:17     Scheduled Meds: . antiseptic oral rinse  7 mL Mouth Rinse BID  . famotidine (PEPCID) IV  20 mg Intravenous Q12H   Continuous Infusions:    LOS: 1 day   Time Spent in minutes   45 minutes  Tahirih Lair D.O. on 09/25/2015 at 12:37 PM  Between 7am to 7pm - Pager - [phone]  After 7pm go to [email or web address] - password [email or web address]  And look for the night coverage person covering for me after hours  [facility] Office  [phone]

## 2015-09-25 NOTE — Evaluation (Signed)
Physical Therapy Evaluation Patient Details Name: Jack Cantrell MRN: [id number] DOB: Aug 21, 1914 Today's Date: 09/25/2015   History of Present Illness  Pt is a 82 year old male admitted with acute respiratory failure secondary to food impaction requiring intubation, extubated 09/24/15 and recurrent esophageal dysmotility with food impaction/Achalasia  Clinical Impression  Pt admitted with above diagnosis. Pt currently with functional limitations due to the deficits listed below (see PT Problem List).  Pt will benefit from skilled PT to increase their independence and safety with mobility to allow discharge to the venue listed below.  Pt appears very confused however able to assist with sitting EOB today.  Per chart review, pt appears w/c level for mobility at baseline.  Recommend return to SNF for rehab.     Follow Up Recommendations SNF    Equipment Recommendations  None recommended by PT    Recommendations for Other Services       Precautions / Restrictions Precautions Precautions: Fall      Mobility  Bed Mobility Overal bed mobility: Needs Assistance Bed Mobility: Supine to Sit;Sit to Supine Rolling: Max assist     Sit to supine: Mod assist   General bed mobility comments: assist for upper and lower body to perform sitting, assist for LEs onto bed  Transfers Overall transfer level:  (not performed today, would need +2 assist)                  Ambulation/Gait                Stairs            Wheelchair Mobility    Modified Rankin (Stroke Patients Only)       Balance Overall balance assessment: Needs assistance Sitting-balance support: Feet supported;No upper extremity supported Sitting balance-Leahy Scale: Fair Sitting balance - Comments: pt able to sit with hands in lap at EOB, does present with increased posterior lean and kyphotic posture in sitting, able to sit EOB for 10 min                                     Pertinent  Vitals/Pain Pain Assessment: No/denies pain    Home Living Family/patient expects to be discharged to:: Skilled nursing facility                 Additional Comments: recent admission from [facility] and was discharged to SNF for rehab per previous notes    Prior Function Level of Independence: Needs assistance   Gait / Transfers Assistance Needed: transfers to w/c per previous admission notes           Hand Dominance        Extremity/Trunk Assessment               Lower Extremity Assessment: Generalized weakness;RLE deficits/detail;LLE deficits/detail RLE Deficits / Details: resting knee flexion, limited extension observed LLE Deficits / Details: resting knee flexion, limited extension observed     Communication   Communication: HOH  Cognition Arousal/Alertness: Awake/alert Behavior During Therapy: Anxious Overall Cognitive Status: Impaired/Different from baseline Area of Impairment: Orientation Orientation Level: Place;Time;Situation             General Comments: pt appears confused, reorientated multiple times, 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)    General Comments      Exercises        Assessment/Plan    PT  Assessment Patient needs continued PT services  PT Diagnosis Generalized weakness   PT Problem List Decreased strength;Decreased range of motion;Decreased activity tolerance;Decreased balance;Decreased mobility;Decreased knowledge of use of DME;Decreased safety awareness;Decreased cognition  PT Treatment Interventions DME instruction;Functional mobility training;Therapeutic activities;Therapeutic exercise;Patient/family education;Wheelchair mobility training;Balance training   PT Goals (Current goals can be found in the Care Plan section) Acute Rehab PT Goals PT Goal Formulation: Patient unable to participate in goal setting Time For Goal Achievement: 10/09/15 Potential to Achieve Goals: Good    Frequency Min  2X/week   Barriers to discharge        Co-evaluation               End of Session   Activity Tolerance: Patient tolerated treatment well Patient left: with call bell/phone within reach;in bed;with bed alarm set (remained in mittens) Nurse Communication: Mobility status         Time: [id number] PT Time Calculation (min) (ACUTE ONLY): 20 min   Charges:   PT Evaluation $PT Eval Low Complexity: 1 Procedure     PT G Codes:        Sitara Cashwell,KATHrine E 09/25/2015, 3:35 PM Carmelia Bake, PT, DPT 09/25/2015 Pager: [phone]

## 2015-09-26 DIAGNOSIS — E440 Moderate protein-calorie malnutrition: Secondary | ICD-10-CM

## 2015-09-26 DIAGNOSIS — R4182 Altered mental status, unspecified: Secondary | ICD-10-CM | POA: Insufficient documentation

## 2015-09-26 DIAGNOSIS — R1310 Dysphagia, unspecified: Secondary | ICD-10-CM

## 2015-09-26 NOTE — Progress Notes (Signed)
Patient is set to discharge back to [facility] today. Patient & son, Nadara Mustard aware. Discharge packet given to RN, Marquet Bellow. [facility] called for transport.     Raynaldo Opitz, [facility] Clinical Social Worker cell #: [phone]

## 2015-09-26 NOTE — Discharge Summary (Signed)
Physician Discharge Summary  Jack Cantrell [id number] DOB: [birth year] DOA: 09/23/2015  PCP: Jack Ruths., MD  Admit date: 09/23/2015 Discharge date: 09/26/2015  Time spent: 45 minutes  Recommendations for Outpatient Follow-up:  Patient will be discharged to [facility].  Patient will need to follow up with primary care provider within one week of discharge.  Patient should continue medications as prescribed.  Patient should follow a dysphagia diet- pured, blenderized diet only.  Discharge Diagnoses:  Acute respiratory failure secondary to food impaction Esophageal dysmotility with food impaction achalasia Acute cephalopathy with unresponsiveness Essentral hypertension  Discharge Condition: Stable  Diet recommendation: dysphagia  Filed Weights   09/24/15 0115 09/25/15 0644 09/26/15 0529  Weight: 60.1 kg (132 lb 7.9 oz) 60.8 kg (134 lb 0.6 oz) 60.102 kg (132 lb 8 oz)    History of present illness:  on 09/24/2015 by Dr. Germain Cantrell (PCCM) [name] is an 82M nursing home resident who presents with recurrent food impaction. He has a history of esophageal dysmotility and underwent EGD with removal of impacted food and medications on 08/31/15. He was sent back to the nursing home on 4/14 with instructions for a dysphagia 2 diet with thin liquids, ground meat and sauces. He completed 7 days of Unasyn that admission for aspiration pneumonia. On 4/29 he was noted to have increased saliva production and regurgitation of food. No nausea per the records. He was evaluated in the ED by GI and taken for urgent endoscopy / disimpaction. He was intubated for the procedure. From the anesthesia records, it appears he was given succinylcholine and propofol for induction, then maintained with desflurane. He also got LR, labetalol and ondasetron. Post-procedure, the patient continued to be unresponsive with minimal respiratory drive. He was left intubated and brought to the ICU. His pupils were noted  to be asymmetric and minimally reactive. The chronicity of this is unclear.  He is known to be hard of hearing and wears hearing aids. No spontaneous movements. He is synchronous with the ventilator. His son reportedly went home.   Hospital Course:  Acute respiratory failure secondary to food impaction -Patient required intubation and was extubated on 09/24/2015 -Currently on no antibiotics -There was concern for aspiration, however, CXR 5/1: persistent mild B/L interstitial prominence, mild pneumonitis cannot be excluded. -Currently, patient is afebrile with no leukocytosis   Esophageal dysmotility with food impaction/Achalasia -Seems to be recurrent problem -Gastroenterology consulted and appreciated -s/p EGD: food in the esophagus, removal was successful. Dilation in the entire esophagus. Examination consistent with achalasia -Recommended pured, blenderized diet only. No solid component to food indefinitely  Acute encephalopathy with unresponsiveness -Likely secondary to the above. -Patient hard of hearing. -Spoke to son via phone, patient needs to be sitting up when he speaks. A similar episode occurred a fews ago after receiving anesthesia.  -Currently awake  Essential Hypertension -Cotninue hydralazine and labetolol PRN  Consultants Gastroenterology PCCM  Procedures  EGD Intubation/Extubatin  Discharge Exam: Filed Vitals:   09/25/15 2237 09/26/15 0529  BP: 161/79 156/67  Pulse: 84 88  Temp: 98 F (36.7 C) 98.9 F (37.2 C)  Resp: 18 18   Exam  General: Well developed, 82 years old, NAD  HEENT: NCAT, mucous membranes moist. Poor denition   Cardiovascular: S1 S2 auscultated, 2/6SEM, Regular rate and rhythm.  Respiratory: Diminished but clear.  Abdomen: Soft, nontender, nondistended, + bowel sounds  Extremities: warm dry without cyanosis clubbing or edema  Neuro: anisocoria (left), right pupil minimally responsive. Speech more clear today  Discharge  Instructions      Discharge Instructions    Discharge instructions    Complete by:  As directed   Patient will be discharged to [facility].  Patient will need to follow up with primary care provider within one week of discharge.  Patient should continue medications as prescribed.  Patient should follow a dysphagia diet- pured, blenderized diet only.            Medication List    TAKE these medications        allopurinol 300 MG tablet  Commonly known as:  ZYLOPRIM  Take 300 mg by mouth daily.     aspirin EC 81 MG tablet  Take 81 mg by mouth daily.     donepezil 5 MG tablet  Commonly known as:  ARICEPT  Take 5 mg by mouth daily.     ENSURE PLUS Liqd  Take 237 mLs by mouth 2 (two) times daily between meals.     feeding supplement (PRO-STAT SUGAR FREE 64) Liqd  Take 30 mLs by mouth daily.     finasteride 5 MG tablet  Commonly known as:  PROSCAR  Take 5 mg by mouth daily.     hyoscyamine 0.375 MG 12 hr tablet  Commonly known as:  LEVBID  Take 0.375 mg by mouth 3 (three) times daily. Cramping     MULTIVITAMIN ADULTS PO  Take 1 tablet by mouth daily.     omeprazole 40 MG capsule  Commonly known as:  PRILOSEC  Take 40 mg by mouth daily.       No Known Allergies Follow-up Information    Follow up with [facility].   Why:  As needed; would request son, Jack Cantrell, call us in 1-2 weeks with symptom update.   Contact information:   [place] [address] [phone]       Follow up with [facility].   Specialty:  Skilled Nursing Facility   Contact information:   [address] [place] [phone]      Follow up with Jack Ruths., MD. Schedule an appointment as soon as possible for a visit in 1 week.   Specialty:  Internal Medicine   Why:  Hospital follow up   Contact information:   [place] [address] [phone]         The results of significant diagnostics from this hospitalization (including imaging, microbiology, ancillary and laboratory) are listed below for reference.    Significant Diagnostic Studies: Dg Chest 2 View  09/23/2015  CLINICAL DATA:  Dysphagia, vomiting EXAM: CHEST  2 VIEW COMPARISON:  09/04/2015 FINDINGS: There is elevation of the left diaphragm. There is no focal parenchymal opacity. There is no pleural effusion or pneumothorax. The cardiomediastinal silhouette is normal. The osseous structures are unremarkable. IMPRESSION: No active cardiopulmonary disease. Electronically Signed   By: Kathreen Devoid   On: 09/23/2015 20:20   Dg Chest 2 View  08/30/2015  CLINICAL DATA:  Nausea, vomiting and cough. EXAM: CHEST  2 VIEW COMPARISON:  None. FINDINGS: Normal heart size. Aortic atherosclerosis is identified. Decreased lung volumes with asymmetric elevation of the left hemidiaphragm. Diffuse opacities are identified in both lungs. Are identified bilaterally which may reflect multifocal pneumonia and or edema. IMPRESSION: 1. Diffuse bilateral pulmonary opacities compatible with edema versus multifocal infection. Electronically Signed   By: Kerby Moors M.D.   On: 08/30/2015 17:14  Dg Chest 2 View  08/29/2015  CLINICAL DATA:  Nausea and vomiting for 2-3 days EXAM: CHEST  2 VIEW COMPARISON:  None. FINDINGS: Cardiac shadow is at the upper limits of normal in size. Elevation of left hemidiaphragm is noted. No focal infiltrate or sizable effusion is seen. No acute bony abnormality is noted. IMPRESSION: No active cardiopulmonary disease. Electronically Signed   By: Inez Catalina M.D.   On: 08/29/2015 21:56   Dg Knee 1-2 Views Left  09/02/2015  CLINICAL DATA:  82 year old with acute onset of left knee pain and swelling which began today. No known injury. EXAM: LEFT KNEE - 1-2 VIEW COMPARISON:  None. FINDINGS: Very large joint effusion. No evidence of acute or subacute fracture or dislocation.  Chondrocalcinosis involving the lateral and medial menisci. Mild to moderate medial compartment joint space narrowing. Patellofemoral and lateral compartment joint spaces well preserved. Mild osseous demineralization. No other intrinsic osseous abnormality. IMPRESSION: 1. No acute or subacute osseous abnormality. 2. CPPD. 3. 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 4. Very large joint effusion. Electronically Signed   By: Evangeline Dakin M.D.   On: 09/02/2015 09:28   Ct Head Wo Contrast  09/24/2015  CLINICAL DATA:  Altered mental status.  Pupils are not equal. EXAM: CT HEAD WITHOUT CONTRAST TECHNIQUE: Contiguous axial images were obtained from the base of the skull through the vertex without intravenous contrast. COMPARISON:  08/29/2015 FINDINGS: Diffuse cerebral atrophy. Mild ventricular dilatation consistent with central atrophy. Low-attenuation changes in the deep white matter consistent small vessel ischemia. No mass effect or midline shift. No abnormal extra-axial fluid collections. Gray-white matter junctions are distinct. Basal cisterns are not effaced. No evidence of acute intracranial hemorrhage. No depressed skull fractures. Visualized paranasal sinuses and mastoid air cells are not opacified. Vascular calcifications. IMPRESSION: No acute intracranial abnormalities. Chronic atrophy and small vessel ischemic. Electronically Signed   By: Lucienne Capers M.D.   On: 09/24/2015 02:35   Ct Head Wo Contrast  08/29/2015  CLINICAL DATA:  Confusion. EXAM: CT HEAD WITHOUT CONTRAST TECHNIQUE: Contiguous axial images were obtained from the base of the skull through the vertex without intravenous contrast. COMPARISON:  None. FINDINGS: Atrophy and periventricular small vessel disease is identified. There are likely some old lacunar infarcts as well in the deep white matter structures. No evidence of hemorrhage, acute infarction, edema, mass effect, extra-axial fluid collection, hydrocephalus or mass  lesion. The skull is unremarkable. IMPRESSION: No acute findings. Atrophy, small vessel disease and old lacunar infarcts identified. Electronically Signed   By: Aletta Edouard M.D.   On: 08/29/2015 20:53   Dg Esophagus  09/03/2015  CLINICAL DATA:  Evaluate esophageal dysmotility and dysphagia. History of "Nutcracker esophagus" . EXAM: ESOPHOGRAM/BARIUM SWALLOW TECHNIQUE: Single contrast examination was performed using  thin barium. FLUOROSCOPY TIME:  Radiation Exposure Index (as provided by the fluoroscopic device): 32.4 mg Y Number of Acquired Images:  None COMPARISON:  Chest radiograph of 08/31/2015 and back to 08/29/2015. FINDINGS: Focused, single-contrast exam performed in an LPO position (secondary to patient clinical status). Incomplete primary peristaltic wave with tertiary contractions and contrast stasis throughout the thoracic esophagus. No persistent dominant esophageal stricture identified. Contrast passage into the stomach, which is normal in caliber and positioned under in elevated left hemidiaphragm. 13 mm barium tablet has delayed passage in the distal esophagus, including on series 41. IMPRESSION: 1. Limited exam, as detailed above. 2. Severe esophageal dysmotility, likely presbyesophagus. 3. No dominant esophageal stricture. 4. Stomach positioned under an elevated left hemidiaphragm. 5. Delayed  passage of 13 mm barium tablet, likely due to esophageal dysmotility. Electronically Signed   By: Abigail Miyamoto M.D.   On: 09/03/2015 12:18   Dg Chest Port 1 View  09/25/2015  CLINICAL DATA:  Pneumonia. EXAM: PORTABLE CHEST 1 VIEW COMPARISON:  09/24/2015.  09/23/2015. FINDINGS: Mediastinum and hilar structures are normal . Mild bilateral interstitial prominence again noted. Mild pneumonitis cannot be excluded . Left lower lobe subsegmental atelectasis and or infiltrate. No pleural effusion or pneumothorax. Elevation left hemidiaphragm. IMPRESSION: 1. Persistent mild bilateral interstitial prominence,  mild pneumonitis cannot be excluded. 2. Left lower lobe subsegmental atelectasis. Elevation left hemidiaphragm. Electronically Signed   By: Marcello Moores  Register   On: 09/25/2015 07:17   Dg Chest Port 1 View  09/24/2015  CLINICAL DATA:  Respiratory failure. EXAM: PORTABLE CHEST 1 VIEW COMPARISON:  09/23/2015. FINDINGS: Endotracheal tube in 2.5 cm above the carina . Heart size normal. Mild bilateral interstitial prominence. Pneumonitis present this fashion. No pneumothorax. Stable elevation of the left hemidiaphragm. IMPRESSION: 1.  Endotracheal tube tip noted 2.5 cm above the carina. 2. Mild bilateral interstitial prominence. Pneumonitis cannot be excluded. Stable elevation left hemidiaphragm. Heart size stable. Electronically Signed   By: Marcello Moores  Register   On: 09/24/2015 07:17   Dg Chest Port 1 View  09/04/2015  CLINICAL DATA:  Onset of shortness of breath today, no other complaints, history of pneumonia, former smoker. EXAM: PORTABLE CHEST 1 VIEW COMPARISON:  Portable chest x-ray of August 31, 2015 FINDINGS: Chronic elevation of the left hemidiaphragm is again observed. Interstitial density in the inferior aspect of the aerated portion of the left lung is slightly more conspicuous today. The right lung is adequately inflated. The interstitial markings are coarse though stable. There is stable mild blunting of the right lateral costophrenic angle. The cardiac silhouette is enlarged but its left border indistinct. There is multilevel degenerative disc disease of the thoracic spine. IMPRESSION: Slight interval increase in airspace opacity in the inferior aspect of the left lung. This is accentuated by persistent elevation of the left hemidiaphragm. Stable chronic bronchitic changes in the right lung. Stable cardiomegaly without pulmonary vascular congestion. Electronically Signed   By: David  Martinique M.D.   On: 09/04/2015 07:49   Dg Chest Port 1 View  08/31/2015  CLINICAL DATA:  82 year old male status post endoscopy  with food impaction in esophagus discovered. Query aspiration. Initial encounter. EXAM: PORTABLE CHEST 1 VIEW COMPARISON:  08/30/2015. FINDINGS: Portable AP semi upright view at 1541 hours. Continued moderate elevation of the left hemidiaphragm and patchy opacity at the left lung base. No confluent opacity in the right lung. Stable cardiomegaly and mediastinal contours. No pneumothorax. No definite effusion. IMPRESSION: Continued moderate elevation of the left hemidiaphragm with nonspecific left lung base opacity which could reflect some left lung aspiration. No definite acute findings on the right. Electronically Signed   By: Genevie Ann M.D.   On: 08/31/2015 15:56    Microbiology: Recent Results (from the past 240 hour(s))  MRSA PCR Screening     Status: None   Collection Time: 09/23/15  6:53 PM  Result Value Ref Range Status   MRSA by PCR NEGATIVE NEGATIVE Final    Comment:        The GeneXpert MRSA Assay (FDA approved for NASAL specimens only), is one component of a comprehensive MRSA colonization surveillance program. It is not intended to diagnose MRSA infection nor to guide or monitor treatment for MRSA infections.   Culture, respiratory (NON-Expectorated)  Status: None (Preliminary result)   Collection Time: 09/24/15 10:26 AM  Result Value Ref Range Status   Specimen Description TRACHEAL ASPIRATE  Final   Special Requests NONE  Final   Gram Stain   Final    ABUNDANT WBC PRESENT,BOTH PMN AND MONONUCLEAR RARE SQUAMOUS EPITHELIAL CELLS PRESENT ABUNDANT GRAM POSITIVE COCCI IN PAIRS IN CLUSTERS FEW GRAM NEGATIVE RODS Performed at [facility]    Culture   Final    Culture reincubated for better growth Performed at [facility]    Report Status PENDING  Incomplete     Labs: Basic Metabolic Panel:  Recent Labs Lab 09/23/15 2019 09/24/15 0313 09/25/15 0519  NA 139 140 142  K 3.8 3.7 4.3  CL 104 105 107  CO2 26 25 28   GLUCOSE 104* 141* 94  BUN 15 15  16   CREATININE 0.74 0.79 0.86  CALCIUM 11.3* 10.8* 10.8*  MG  --  1.9  --   PHOS  --  2.9  --    Liver Function Tests:  Recent Labs Lab 09/24/15 0313 09/25/15 0519  AST 25 20  ALT 15* 12*  ALKPHOS 92 74  BILITOT 0.3 0.6  PROT 7.0 6.3*  ALBUMIN 3.7 3.2*   No results for input(s): LIPASE, AMYLASE in the last 168 hours. No results for input(s): AMMONIA in the last 168 hours. CBC:  Recent Labs Lab 09/23/15 2019 09/24/15 0313 09/25/15 0519  WBC 9.4 14.9* 10.1  HGB 13.1 13.3 11.7*  HCT 39.8 40.6 36.0*  MCV 88.6 88.8 89.8  PLT 306 294 244   Cardiac Enzymes: No results for input(s): CKTOTAL, CKMB, CKMBINDEX, TROPONINI in the last 168 hours. BNP: BNP (last 3 results)  Recent Labs  08/30/15 1533  BNP 120.6*    ProBNP (last 3 results) No results for input(s): PROBNP in the last 8760 hours.  CBG:  Recent Labs Lab 09/24/15 1110 09/24/15 1112 09/24/15 1302  GLUCAP 106* 85 114*       Signed:  Deyci Gesell  [facility] 09/26/2015, 12:01 PM

## 2015-09-28 ENCOUNTER — Encounter: Payer: Self-pay | Admitting: Internal Medicine

## 2015-09-28 ENCOUNTER — Non-Acute Institutional Stay (SKILLED_NURSING_FACILITY): Payer: Medicare Other | Admitting: Internal Medicine

## 2015-09-28 DIAGNOSIS — F0390 Unspecified dementia without behavioral disturbance: Secondary | ICD-10-CM | POA: Diagnosis not present

## 2015-09-28 DIAGNOSIS — I10 Essential (primary) hypertension: Secondary | ICD-10-CM

## 2015-09-28 DIAGNOSIS — M1A09X0 Idiopathic chronic gout, multiple sites, without tophus (tophi): Secondary | ICD-10-CM

## 2015-09-28 DIAGNOSIS — G9340 Encephalopathy, unspecified: Secondary | ICD-10-CM | POA: Diagnosis not present

## 2015-09-28 DIAGNOSIS — K224 Dyskinesia of esophagus: Secondary | ICD-10-CM | POA: Diagnosis not present

## 2015-09-28 DIAGNOSIS — J9601 Acute respiratory failure with hypoxia: Secondary | ICD-10-CM

## 2015-09-28 DIAGNOSIS — N400 Enlarged prostate without lower urinary tract symptoms: Secondary | ICD-10-CM | POA: Diagnosis not present

## 2015-09-28 LAB — CULTURE, RESPIRATORY W GRAM STAIN

## 2015-09-28 LAB — CULTURE, RESPIRATORY

## 2015-09-28 NOTE — [place] Notes (Signed)
MRN: [id number] Name: Jack Cantrell  Sex: male Age: 82 y.o. DOB: Jan 07, 1915  Deputy #: [facility] Facility/Room: 104 P Level Of Care: SNF Provider: [name] Emergency Contacts: Extended Emergency Contact Information Primary Emergency Contact: Bonenberger,[name] Address: [address]          [place], [place] [address] [place] Phone: [phone] Mobile Phone: [phone] Relation: Son  Code Status:   Allergies: Review of patient's allergies indicates no known allergies.  Chief Complaint  Patient presents with  . Readmit To SNF    HPI: Patient is 82 y.o. male who presents with recurrent food impaction. He has a history of esophageal dysmotility and underwent EGD with removal of impacted food and medications on 08/31/15. He was sent back to the nursing home on 4/14 with instructions for a dysphagia 2 diet with thin liquids, ground meat and sauces. He completed 7 days of Unasyn that admission for aspiration pneumonia. On 4/29 he was noted to have increased saliva production and regurgitation of food. No nausea per the records. He was evaluated in the ED by GI and taken for urgent endoscopy / disimpaction. He was intubated for the procedure. Pt was admitted to [facility] from 4/30- 5/3 where he had difficulty with anesthesia but reawakened and founf to have hx of the same. Pt's entire esophagus was dilated, it was unable to be dilated last time and pt place on purred diet, never to eat solid again. Pt is admitted to SNF for residential care . [facility] at [facility] pt will be followed for dementia, tx with aricept, BPH, tx with proscar and gout, tx with allopurinol.  Past Medical History  Diagnosis Date  . Esophageal abnormality   . Gout   . Cancer [facility])     Past Surgical History  Procedure Laterality Date  . Joint replacement      Hip  . Esophagogastroduodenoscopy (egd) with propofol N/A 08/31/2015    Procedure: ESOPHAGOGASTRODUODENOSCOPY (EGD) WITH PROPOFOL;  Surgeon: Wonda Horner, MD;  Location: [facility];  Service: Endoscopy;  Laterality: N/A;  . Esophagogastroduodenoscopy N/A 09/23/2015    Procedure: ESOPHAGOGASTRODUODENOSCOPY (EGD);  Surgeon: Arta Silence, MD;  Location: [facility] ORS;  Service: Endoscopy;  Laterality: N/A;      Medication List       This list is accurate as of: 09/28/15 11:59 PM.  Always use your most recent med list.               allopurinol 300 MG tablet  Commonly known as:  ZYLOPRIM  Take 300 mg by mouth daily.     aspirin EC 81 MG tablet  Take 81 mg by mouth daily.     donepezil 5 MG tablet  Commonly known as:  ARICEPT  Take 5 mg by mouth daily.     ENSURE PLUS Liqd  Take 237 mLs by mouth 2 (two) times daily between meals.     feeding supplement (PRO-STAT SUGAR FREE 64) Liqd  Take 30 mLs by mouth daily.     finasteride 5 MG tablet  Commonly known as:  PROSCAR  Take 5 mg by mouth daily.     hyoscyamine 0.375 MG 12 hr tablet  Commonly known as:  LEVBID  Take 0.375 mg by mouth 3 (three) times daily. Cramping     MULTIVITAMIN ADULTS PO  Take 1 tablet by mouth daily.     omeprazole 40 MG capsule  Commonly known as:  PRILOSEC  Take 40 mg by mouth daily.  No orders of the defined types were placed in this encounter.     There is no immunization history on file for this patient.  Social History  Substance Use Topics  . Smoking status: Former Research scientist (life sciences)  . Smokeless tobacco: Not on file  . Alcohol Use: No    Family history is + HF, brother  Review of Systems  UTO 2/2 confusion; nursing concerned only with pt's confusion    Filed Vitals:   09/28/15 1233  BP: 118/77  Pulse: 94  Temp: 97 F (36.1 C)  Resp: 20    SpO2 Readings from Last 1 Encounters:  09/26/15 100%        Physical Exam  GENERAL APPEARANCE: Alert, conversant,  No acute distress.  SKIN: No diaphoresis rash HEAD: Normocephalic, atraumatic  EYES: Conjunctiva/lids clear. Pupils round, reactive. EOMs intact.  EARS: External  exam WNL, canals clear. Hearing grossly normal.  NOSE: No deformity or discharge.  MOUTH/THROAT: Lips w/o lesions  RESPIRATORY: Breathing is even, unlabored. Lung sounds are clear   CARDIOVASCULAR: Heart RRR no murmurs, rubs or gallops. No peripheral edema.   GASTROINTESTINAL: Abdomen is soft, non-tender, not distended w/ normal bowel sounds. GENITOURINARY: Bladder non tender, not distended  MUSCULOSKELETAL: kyphosis and arthritis NEUROLOGIC:  Cranial nerves 2-12 grossly intact. Moves all extremities  PSYCHIATRIC: confused, no behavioral issues  Patient Active Problem List   Diagnosis Date Noted  . Acute encephalopathy 09/29/2015  . HTN (hypertension) 09/29/2015  . Altered mental status   . Acute respiratory failure ([place]) 09/24/2015  . Malnutrition of moderate degree 09/24/2015  . Dysphagia 09/16/2015  . Arm swelling 09/16/2015  . Gout 09/16/2015  . Pressure ulcer 08/31/2015  . BPH (benign prostatic hypertrophy) 08/31/2015  . Dementia without behavioral disturbance 08/31/2015  . 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 Sepsis ([place]) 08/31/2015  . Pneumonia 08/30/2015  . CAP (community acquired pneumonia) 08/30/2015    CBC    Component Value Date/Time   WBC 10.1 09/25/2015 0519   RBC 4.01* 09/25/2015 0519   HGB 11.7* 09/25/2015 0519   HCT 36.0* 09/25/2015 0519   PLT 244 09/25/2015 0519   MCV 89.8 09/25/2015 0519   LYMPHSABS 0.5* 08/30/2015 1533   MONOABS 0.9 08/30/2015 1533   EOSABS 0.0 08/30/2015 1533   BASOSABS 0.0 08/30/2015 1533    CMP     Component Value Date/Time   NA 142 09/25/2015 0519   K 4.3 09/25/2015 0519   CL 107 09/25/2015 0519   CO2 28 09/25/2015 0519   GLUCOSE 94 09/25/2015 0519   BUN 16 09/25/2015 0519   CREATININE 0.86 09/25/2015 0519   CALCIUM 10.8* 09/25/2015 0519   PROT 6.3* 09/25/2015 0519   ALBUMIN 3.2* 09/25/2015 0519   AST 20 09/25/2015 0519   ALT 12* 09/25/2015 0519   ALKPHOS 74 09/25/2015 0519   BILITOT 0.6 09/25/2015 0519   GFRNONAA  >60 09/25/2015 0519   GFRAA >60 09/25/2015 0519    No results found for: HGBA1C   Dg Chest 2 View  09/23/2015  CLINICAL DATA:  Dysphagia, vomiting EXAM: CHEST  2 VIEW COMPARISON:  09/04/2015 FINDINGS: There is elevation of the left diaphragm. There is no focal parenchymal opacity. There is no pleural effusion or pneumothorax. The cardiomediastinal silhouette is normal. The osseous structures are unremarkable. IMPRESSION: No active cardiopulmonary disease. Electronically Signed   By: Kathreen Devoid   On: 09/23/2015 20:20   Ct Head Wo Contrast  09/24/2015  CLINICAL DATA:  Altered mental status.  Pupils are not equal. EXAM:  CT HEAD WITHOUT CONTRAST TECHNIQUE: Contiguous axial images were obtained from the base of the skull through the vertex without intravenous contrast. COMPARISON:  08/29/2015 FINDINGS: Diffuse cerebral atrophy. Mild ventricular dilatation consistent with central atrophy. Low-attenuation changes in the deep white matter consistent small vessel ischemia. No mass effect or midline shift. No abnormal extra-axial fluid collections. Gray-white matter junctions are distinct. Basal cisterns are not effaced. No evidence of acute intracranial hemorrhage. No depressed skull fractures. Visualized paranasal sinuses and mastoid air cells are not opacified. Vascular calcifications. IMPRESSION: No acute intracranial abnormalities. Chronic atrophy and small vessel ischemic. Electronically Signed   By: Lucienne Capers M.D.   On: 09/24/2015 02:35   Dg Chest Port 1 View  09/24/2015  CLINICAL DATA:  Respiratory failure. EXAM: PORTABLE CHEST 1 VIEW COMPARISON:  09/23/2015. FINDINGS: Endotracheal tube in 2.5 cm above the carina . Heart size normal. Mild bilateral interstitial prominence. Pneumonitis present this fashion. No pneumothorax. Stable elevation of the left hemidiaphragm. IMPRESSION: 1.  Endotracheal tube tip noted 2.5 cm above the carina. 2. Mild bilateral interstitial prominence. Pneumonitis cannot be  excluded. Stable elevation left hemidiaphragm. Heart size stable. Electronically Signed   By: Marcello Moores  Register   On: 09/24/2015 07:17    Not all labs, radiology exams or other studies done during hospitalization come through on my EPIC note; however they are reviewed by me.    Assessment and Plan  Acute respiratory failure (HCC) secondary to food impaction -Patient required intubation and was extubated on 09/24/2015 -Currently on no antibiotics -There was concern for aspiration, however, CXR 5/1: persistent mild B/L interstitial prominence, mild pneumonitis cannot be excluded. SNf - pt's esophagus was dilated so hpefully no more problems, will monitor  Nutcracker esophagus with food impaction/Achalasia -Seems to be recurrent problem -Gastroenterology consulted and appreciated -s/p EGD: food in the esophagus, removal was successful. Dilation in the entire esophagus. Examination consistent with achalasia SNF -- pured, blenderized diet only. No solid component to food indefinitely   Acute encephalopathy with unresponsiveness -Likely secondary to the above. -Patient hard of hearing. -Spoke to son via phone, patient needs to be sitting up when he speaks. A similar episode occurred a fews ago after receiving anesthesia.  SNf - pt is awake and speaking but is very confused  HTN (hypertension) SNF - controlled on no meds; will monitor  Dementia without behavioral disturbance SNF - cont aricept 5 mg; pt is still confused from this last hospitalization  BPH (benign prostatic hypertrophy) SNF - not stated as uncontrolled ; cont proscar  Gout SNF - no current problems; cont allopurinol   Time spent> 45 min;> 50% of time with patient was spent reviewing records, labs, tests and studies, counseling and developing plan of care  Inocencio Homes, MD

## 2015-09-29 ENCOUNTER — Encounter: Payer: Self-pay | Admitting: Internal Medicine

## 2015-09-29 DIAGNOSIS — I10 Essential (primary) hypertension: Secondary | ICD-10-CM | POA: Insufficient documentation

## 2015-09-29 DIAGNOSIS — G9340 Encephalopathy, unspecified: Secondary | ICD-10-CM | POA: Insufficient documentation

## 2015-09-29 NOTE — Assessment & Plan Note (Signed)
secondary to food impaction -Patient required intubation and was extubated on 09/24/2015 -Currently on no antibiotics -There was concern for aspiration, however, CXR 5/1: persistent mild B/L interstitial prominence, mild pneumonitis cannot be excluded. SNf - pt's esophagus was dilated so hpefully no more problems, will monitor

## 2015-09-29 NOTE — Assessment & Plan Note (Signed)
SNF - not stated as uncontrolled ; cont proscar

## 2015-09-29 NOTE — Assessment & Plan Note (Signed)
with unresponsiveness -Likely secondary to the above. -Patient hard of hearing. -Spoke to son via phone, patient needs to be sitting up when he speaks. A similar episode occurred a fews ago after receiving anesthesia.  SNf - pt is awake and speaking but is very confused

## 2015-09-29 NOTE — Assessment & Plan Note (Signed)
with food impaction/Achalasia -Seems to be recurrent problem -Gastroenterology consulted and appreciated -s/p EGD: food in the esophagus, removal was successful. Dilation in the entire esophagus. Examination consistent with achalasia SNF -- pured, blenderized diet only. No solid component to food indefinitely

## 2015-09-29 NOTE — Assessment & Plan Note (Signed)
SNF - cont aricept 5 mg; pt is still confused from this last hospitalization

## 2015-09-29 NOTE — Assessment & Plan Note (Signed)
SNF - no current problems; cont allopurinol

## 2015-09-29 NOTE — Assessment & Plan Note (Signed)
SNF - controlled on no meds; will monitor

## 2015-10-01 ENCOUNTER — Non-Acute Institutional Stay (SKILLED_NURSING_FACILITY): Payer: Medicare Other | Admitting: Internal Medicine

## 2015-10-01 ENCOUNTER — Encounter: Payer: Self-pay | Admitting: Internal Medicine

## 2015-10-01 DIAGNOSIS — R109 Unspecified abdominal pain: Secondary | ICD-10-CM | POA: Diagnosis not present

## 2015-10-01 DIAGNOSIS — R1310 Dysphagia, unspecified: Secondary | ICD-10-CM

## 2015-10-01 NOTE — [place] Notes (Signed)
Location:  [place] Room Number: [place] of Service:  SNF [phone])   Kirk Ruths., MD  Patient Care Team: Kirk Ruths, MD as PCP - General (Internal Medicine)  Extended Emergency Contact Information Primary Emergency Contact: Bekker,[name] Address: [address]          [place], [place] [address] [name] Phone: [phone] Mobile Phone: [phone] Relation: Son  Code Status: DNR Goals of care: Advanced Directive information Advanced Directives 10/01/2015  Does patient have an advance directive? Yes  Type of Advance Directive Out of facility DNR (pink MOST or yellow form)  Does patient want to make changes to advanced directive? No - Patient declined  Copy of advanced directive(s) in chart? -     Chief Complaint  Patient presents with  . Acute Visit    Pain on right side  Right-sided abdominal discomfort while eating  HPI:  Pt is a 82 y.o. male seen today for an acute visit for complaints of right-sided abdominal discomfort while eating over the weekend.  However patient states he is not having any discomfort today apparently he ate well at lunch and breakfast according to his nurse and he has not complained of any abdominal issues no nausea no vomiting noted.  He does have a history of nutcracker esophagus with food impaction achalasia.  He was consulted by Deretha Emory on and dry with G in the hospital he status post EGD had dilation of the entire esophagus he is on a [place] blenderized diet only no solid component food.  Nonetheless apparently he is doing well with this today.  He has no other complaints he does have a history of dementia respiratory failure that was thought secondary to food impaction      Past Medical History  Diagnosis Date  . Esophageal abnormality   . Gout   . Cancer [facility])    Past Surgical History  Procedure Laterality Date  . Joint replacement      Hip  .  Esophagogastroduodenoscopy (egd) with propofol N/A 08/31/2015    Procedure: ESOPHAGOGASTRODUODENOSCOPY (EGD) WITH PROPOFOL;  Surgeon: Wonda Horner, MD;  Location: [facility];  Service: Endoscopy;  Laterality: N/A;  . Esophagogastroduodenoscopy N/A 09/23/2015    Procedure: ESOPHAGOGASTRODUODENOSCOPY (EGD);  Surgeon: Arta Silence, MD;  Location: [facility]S;  Service: Endoscopy;  Laterality: N/A;    No Known Allergies    Medication List       This list is accurate as of: 10/01/15 11:08 AM.  Always use your most recent med list.               allopurinol 300 MG tablet  Commonly known as:  ZYLOPRIM  Take 300 mg by mouth daily.     aspirin EC 81 MG tablet  Take 81 mg by mouth daily.     donepezil 5 MG tablet  Commonly known as:  ARICEPT  Take 5 mg by mouth daily.     ENSURE PLUS Liqd  Take 237 mLs by mouth 2 (two) times daily between meals.     feeding supplement (PRO-STAT SUGAR FREE 64) Liqd  Take 30 mLs by mouth daily.     finasteride 5 MG tablet  Commonly known as:  PROSCAR  Take 5 mg by mouth daily.     hyoscyamine 0.375 MG 12 hr tablet  Commonly known as:  LEVBID  Take 0.375 mg by mouth 3 (three) times daily. Cramping     loratadine 10 MG tablet  Commonly known as:  CLARITIN  Take 10 mg by mouth daily. For seasonal allergies.     MULTIVITAMIN ADULTS PO  Take 1 tablet by mouth daily.     omeprazole 40 MG capsule  Commonly known as:  PRILOSEC  Take 40 mg by mouth daily.     saccharomyces boulardii 250 MG capsule  Commonly known as:  FLORASTOR  Take 250 mg by mouth daily.        Review of Systems   This is limited secondary to dementia-again according to nursing there are been no complaints no nausea no vomiting noted today patient is not complaining of abdominal discomfort when speak to him today.    Immunization History  Administered Date(s) Administered  . PPD Test 09/08/2015   Pertinent  Health Maintenance Due  Topic Date Due  . PNA vac Low Risk  Adult (1 of 2 - PCV13) 02/11/2016 (Originally 08/25/1979)  . INFLUENZA VACCINE  12/25/2015   No flowsheet data found. Functional Status Survey:    Filed Vitals:   10/01/15 1036  BP: 118/72  Pulse: 81  Temp: 96.8 F (36 C)  TempSrc: Oral  Resp: 18  Height: 5\' 3"  (1.6 m)  Weight: 130 lb 3.2 oz (59.058 kg)   Body mass index is 23.07 kg/(m^2). Physical Exam   In general this is an elderly male in no distress sitting comfortably in his chair.  His skin is warm and dry.  Oropharynx is clear mucous membranes moist.  Chest is clear to auscultation there is no labored breathing.  Heart is regular rate and rhythm with a 2/6 systolic murmur he has trace lower extremity edema bilaterally.  Abdomen is soft nontender with positive bowel sounds benign exam.  Muscle skeletal is able to move all extremities 4 but has significant arthritic changes upper and lower extremities  Neurologic is grossly intact speech is clear although he has significant confusion.  Psych he is oriented to self only is cooperative with exam with prompting  Labs reviewed:  Recent Labs  09/07/15 0516 09/23/15 2019 09/24/15 0313 09/25/15 0519  NA 136 139 140 142  K 3.9 3.8 3.7 4.3  CL 101 104 105 107  CO2 30 26 25 28   GLUCOSE 106* 104* 141* 94  BUN 18 15 15 16   CREATININE 0.70 0.74 0.79 0.86  CALCIUM 8.9 11.3* 10.8* 10.8*  MG 2.0  --  1.9  --   PHOS  --   --  2.9  --     Recent Labs  09/04/15 0529 09/24/15 0313 09/25/15 0519  AST 15 25 20   ALT 11* 15* 12*  ALKPHOS 52 92 74  BILITOT 0.6 0.3 0.6  PROT 5.3* 7.0 6.3*  ALBUMIN 2.5* 3.7 3.2*    Recent Labs  08/29/15 2025 08/30/15 1533  09/23/15 2019 09/24/15 0313 09/25/15 0519  WBC 17.4* 20.1*  < > 9.4 14.9* 10.1  NEUTROABS 15.9* 18.8*  --   --   --   --   HGB 13.1 14.3  < > 13.1 13.3 11.7*  HCT 40.3 42.9  < > 39.8 40.6 36.0*  MCV 88.8 89.6  < > 88.6 88.8 89.8  PLT 209 243  < > 306 294 244  < > = values in this interval not  displayed. No results found for: TSH No results found for: HGBA1C No results found for: CHOL, HDL, LDLCALC, LDLDIRECT, TRIG, CHOLHDL  Significant Diagnostic Results in last 30 days:  Dg Chest 2 View  09/23/2015  CLINICAL DATA:  Dysphagia, vomiting EXAM: CHEST  2 VIEW COMPARISON:  09/04/2015 FINDINGS: There is elevation of the left diaphragm. There is no focal parenchymal opacity. There is no pleural effusion or pneumothorax. The cardiomediastinal silhouette is normal. The osseous structures are unremarkable. IMPRESSION: No active cardiopulmonary disease. Electronically Signed   By: Kathreen Devoid   On: 09/23/2015 20:20   Dg Knee 1-2 Views Left  09/02/2015  CLINICAL DATA:  81 year old with acute onset of left knee pain and swelling which began today. No known injury. EXAM: LEFT KNEE - 1-2 VIEW COMPARISON:  None. FINDINGS: Very large joint effusion. No evidence of acute or subacute fracture or dislocation. Chondrocalcinosis involving the lateral and medial menisci. Mild to moderate medial compartment joint space narrowing. Patellofemoral and lateral compartment joint spaces well preserved. Mild osseous demineralization. No other intrinsic osseous abnormality. IMPRESSION: 1. No acute or subacute osseous abnormality. 2. CPPD. 3. 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 4. Very large joint effusion. Electronically Signed   By: Evangeline Dakin M.D.   On: 09/02/2015 09:28   Ct Head Wo Contrast  09/24/2015  CLINICAL DATA:  Altered mental status.  Pupils are not equal. EXAM: CT HEAD WITHOUT CONTRAST TECHNIQUE: Contiguous axial images were obtained from the base of the skull through the vertex without intravenous contrast. COMPARISON:  08/29/2015 FINDINGS: Diffuse cerebral atrophy. Mild ventricular dilatation consistent with central atrophy. Low-attenuation changes in the deep white matter consistent small vessel ischemia. No mass effect or midline shift. No abnormal extra-axial fluid collections.  Gray-white matter junctions are distinct. Basal cisterns are not effaced. No evidence of acute intracranial hemorrhage. No depressed skull fractures. Visualized paranasal sinuses and mastoid air cells are not opacified. Vascular calcifications. IMPRESSION: No acute intracranial abnormalities. Chronic atrophy and small vessel ischemic. Electronically Signed   By: Lucienne Capers M.D.   On: 09/24/2015 02:35   Dg Esophagus  09/03/2015  CLINICAL DATA:  Evaluate esophageal dysmotility and dysphagia. History of "Nutcracker esophagus" . EXAM: ESOPHOGRAM/BARIUM SWALLOW TECHNIQUE: Single contrast examination was performed using  thin barium. FLUOROSCOPY TIME:  Radiation Exposure Index (as provided by the fluoroscopic device): 32.4 mg Y Number of Acquired Images:  None COMPARISON:  Chest radiograph of 08/31/2015 and back to 08/29/2015. FINDINGS: Focused, single-contrast exam performed in an LPO position (secondary to patient clinical status). Incomplete primary peristaltic wave with tertiary contractions and contrast stasis throughout the thoracic esophagus. No persistent dominant esophageal stricture identified. Contrast passage into the stomach, which is normal in caliber and positioned under in elevated left hemidiaphragm. 13 mm barium tablet has delayed passage in the distal esophagus, including on series 41. IMPRESSION: 1. Limited exam, as detailed above. 2. Severe esophageal dysmotility, likely presbyesophagus. 3. No dominant esophageal stricture. 4. Stomach positioned under an elevated left hemidiaphragm. 5. Delayed passage of 13 mm barium tablet, likely due to esophageal dysmotility. Electronically Signed   By: Abigail Miyamoto M.D.   On: 09/03/2015 12:18   Dg Chest Port 1 View  09/25/2015  CLINICAL DATA:  Pneumonia. EXAM: PORTABLE CHEST 1 VIEW COMPARISON:  09/24/2015.  09/23/2015. FINDINGS: Mediastinum and hilar structures are normal . Mild bilateral interstitial prominence again noted. Mild pneumonitis cannot be  excluded . Left lower lobe subsegmental atelectasis and or infiltrate. No pleural effusion or pneumothorax. Elevation left hemidiaphragm. IMPRESSION: 1. Persistent mild bilateral interstitial prominence, mild pneumonitis cannot be excluded. 2. Left lower lobe subsegmental atelectasis. Elevation left hemidiaphragm. Electronically Signed   By: Marcello Moores  Register   On: 09/25/2015 07:17   Dg Chest [facility]  09/24/2015  CLINICAL DATA:  Respiratory failure. EXAM: PORTABLE CHEST 1 VIEW COMPARISON:  09/23/2015. FINDINGS: Endotracheal tube in 2.5 cm above the carina . Heart size normal. Mild bilateral interstitial prominence. Pneumonitis present this fashion. No pneumothorax. Stable elevation of the left hemidiaphragm. IMPRESSION: 1.  Endotracheal tube tip noted 2.5 cm above the carina. 2. Mild bilateral interstitial prominence. Pneumonitis cannot be excluded. Stable elevation left hemidiaphragm. Heart size stable. Electronically Signed   By: Marcello Moores  Register   On: 09/24/2015 07:17   Dg Chest Port 1 View  09/04/2015  CLINICAL DATA:  Onset of shortness of breath today, no other complaints, history of pneumonia, former smoker. EXAM: PORTABLE CHEST 1 VIEW COMPARISON:  Portable chest x-ray of August 31, 2015 FINDINGS: Chronic elevation of the left hemidiaphragm is again observed. Interstitial density in the inferior aspect of the aerated portion of the left lung is slightly more conspicuous today. The right lung is adequately inflated. The interstitial markings are coarse though stable. There is stable mild blunting of the right lateral costophrenic angle. The cardiac silhouette is enlarged but its left border indistinct. There is multilevel degenerative disc disease of the thoracic spine. IMPRESSION: Slight interval increase in airspace opacity in the inferior aspect of the left lung. This is accentuated by persistent elevation of the left hemidiaphragm. Stable chronic bronchitic changes in the right lung. Stable  cardiomegaly without pulmonary vascular congestion. Electronically Signed   By: David  Martinique M.D.   On: 09/04/2015 07:49    Assessment/Plan  #1 right-sided abdominal pain-is not really complaining of this while eating today had a good appetite today per nursing at this point will monitor I do note recent liver function tests on May 2 were centrally unremarkable monitor for any changes here.  [id number]

## 2015-10-02 LAB — CBC AND DIFFERENTIAL
HEMATOCRIT: 34 % — AB (ref 41–53)
Hemoglobin: 11 g/dL — AB (ref 13.5–17.5)
PLATELETS: 270 10*3/uL (ref 150–399)
WBC: 21.3 10*3/mL

## 2015-10-02 LAB — BASIC METABOLIC PANEL
BUN: 27 mg/dL — AB (ref 4–21)
Creatinine: 0.7 mg/dL (ref 0.6–1.3)
GLUCOSE: 108 mg/dL
Potassium: 4.1 mmol/L (ref 3.4–5.3)
SODIUM: 136 mmol/L — AB (ref 137–147)

## 2015-10-06 DIAGNOSIS — R109 Unspecified abdominal pain: Secondary | ICD-10-CM | POA: Insufficient documentation

## 2015-11-07 ENCOUNTER — Encounter: Payer: Self-pay | Admitting: Internal Medicine

## 2015-11-07 ENCOUNTER — Non-Acute Institutional Stay (SKILLED_NURSING_FACILITY): Payer: Medicare Other | Admitting: Internal Medicine

## 2015-11-07 DIAGNOSIS — F0390 Unspecified dementia without behavioral disturbance: Secondary | ICD-10-CM

## 2015-11-07 DIAGNOSIS — N400 Enlarged prostate without lower urinary tract symptoms: Secondary | ICD-10-CM | POA: Diagnosis not present

## 2015-11-07 DIAGNOSIS — M1A09X0 Idiopathic chronic gout, multiple sites, without tophus (tophi): Secondary | ICD-10-CM

## 2015-11-07 NOTE — Progress Notes (Signed)
MRN: [id number] Name: Jack Cantrell  Sex: male Age: 82 y.o. DOB: 18-Dec-1914  [place] #:  Facility/Room: [facility] / [facility] Level Of Care: SNF Provider: [name], MD Emergency Contacts: Extended Emergency Contact Information Primary Emergency Contact: Cantrell,Jack Address: [address]          [place], [place] [address] [name] of [place] Phone: [phone] Mobile Phone: [phone] Relation: Son  Code Status: DNR  Allergies: Review of patient's allergies indicates no known allergies.  Chief Complaint  Patient presents with  . Medical Management of Chronic Issues    Routine Visit    HPI: Patient is 82 y.o. male who is being seen for routine issues of dementia, BPH and gout.  Past Medical History  Diagnosis Date  . Esophageal abnormality   . Gout   . Cancer [facility])     Past Surgical History  Procedure Laterality Date  . Joint replacement      Hip  . Esophagogastroduodenoscopy (egd) with propofol N/A 08/31/2015    Procedure: ESOPHAGOGASTRODUODENOSCOPY (EGD) WITH PROPOFOL;  Surgeon: Wonda Horner, MD;  [facility];  Service: Endoscopy;  Laterality: N/A;  . Esophagogastroduodenoscopy N/A 09/23/2015    Procedure: ESOPHAGOGASTRODUODENOSCOPY (EGD);  Surgeon: Arta Silence, MD;  [facility]: [facility];  Service: Endoscopy;  Laterality: N/A;      Medication List       This list is accurate as of: 11/07/15 11:59 PM.  Always use your most recent med list.               aspirin EC 81 MG tablet  Take 81 mg by mouth daily.     donepezil 5 MG tablet  Commonly known as:  ARICEPT  Take 5 mg by mouth daily.     ENSURE PLUS Liqd  Take 237 mLs by mouth 2 (two) times daily between meals.     feeding supplement (PRO-STAT SUGAR FREE 64) Liqd  Take 30 mLs by mouth daily.     finasteride 5 MG tablet  Commonly known as:  PROSCAR  Take 5 mg by mouth daily.     hyoscyamine 0.375 MG 12 hr tablet  Commonly known as:  LEVBID  Take 0.375 mg by mouth every 8  (eight) hours.     loratadine 10 MG tablet  Commonly known as:  CLARITIN  Take 10 mg by mouth daily. For seasonal allergies.     mometasone 50 MCG/ACT nasal spray  Commonly known as:  NASONEX  Place 1 spray into the nose daily as needed.     MULTIVITAMIN ADULTS PO  Take 1 tablet by mouth daily.     omeprazole 40 MG capsule  Commonly known as:  PRILOSEC  Take 40 mg by mouth daily.     OXYGEN  Inhale 2 L into the lungs continuous.     polyethylene glycol packet  Commonly known as:  MIRALAX / GLYCOLAX  Take 17 g by mouth daily.     saccharomyces boulardii 250 MG capsule  Commonly known as:  FLORASTOR  Take 250 mg by mouth daily.        Meds ordered this encounter  Medications  . polyethylene glycol (MIRALAX / GLYCOLAX) packet    Sig: Take 17 g by mouth daily.  . OXYGEN    Sig: Inhale 2 L into the lungs continuous.    Immunization History  Administered Date(s) Administered  . PPD Test 09/08/2015, 09/26/2015    Social History  Substance Use Topics  . Smoking status: Former  Smoker  . Smokeless tobacco: Not on file  . Alcohol Use: No    Review of Systems  UTO 2/2 dementia    Filed Vitals:   11/07/15 1223  BP: 125/80  Pulse: 74  Temp: 96.9 F (36.1 C)  Resp: 20    Physical Exam  GENERAL APPEARANCE: Alert, conversant, No acute distress  SKIN: No diaphoresis rash HEENT: Unremarkable RESPIRATORY: Breathing is even, unlabored. Lung sounds are clear   CARDIOVASCULAR: Heart RRR no murmurs, rubs or gallops. No peripheral edema  GASTROINTESTINAL: Abdomen is soft, non-tender, not distended w/ normal bowel sounds.  GENITOURINARY: Bladder non tender, not distended  MUSCULOSKELETAL: kyphosis and arthritis NEUROLOGIC: Cranial nerves 2-12 grossly intact. Moves all extremities PSYCHIATRIC: confused, no behavioral issues  Patient Active Problem List   Diagnosis Date Noted  . Abdominal pain 10/06/2015  . Acute encephalopathy 09/29/2015  . HTN (hypertension)  09/29/2015  . Altered mental status   . Acute respiratory failure ([place]) 09/24/2015  . Malnutrition of moderate degree 09/24/2015  . Dysphagia 09/16/2015  . Arm swelling 09/16/2015  . Gout 09/16/2015  . Pressure ulcer 08/31/2015  . BPH (benign prostatic hypertrophy) 08/31/2015  . Dementia without behavioral disturbance 08/31/2015  . 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 Sepsis ([place]) 08/31/2015  . Pneumonia 08/30/2015  . CAP (community acquired pneumonia) 08/30/2015    CBC    Component Value Date/Time   WBC 21.3 10/02/2015   WBC 10.1 09/25/2015 0519   RBC 4.01* 09/25/2015 0519   HGB 11.0* 10/02/2015   HCT 34* 10/02/2015   PLT 270 10/02/2015   MCV 89.8 09/25/2015 0519   LYMPHSABS 0.5* 08/30/2015 1533   MONOABS 0.9 08/30/2015 1533   EOSABS 0.0 08/30/2015 1533   BASOSABS 0.0 08/30/2015 1533    CMP     Component Value Date/Time   NA 136* 10/02/2015   NA 142 09/25/2015 0519   K 4.1 10/02/2015   CL 107 09/25/2015 0519   CO2 28 09/25/2015 0519   GLUCOSE 94 09/25/2015 0519   BUN 27* 10/02/2015   BUN 16 09/25/2015 0519   CREATININE 0.7 10/02/2015   CREATININE 0.86 09/25/2015 0519   CALCIUM 10.8* 09/25/2015 0519   PROT 6.3* 09/25/2015 0519   ALBUMIN 3.2* 09/25/2015 0519   AST 20 09/25/2015 0519   ALT 12* 09/25/2015 0519   ALKPHOS 74 09/25/2015 0519   BILITOT 0.6 09/25/2015 0519   GFRNONAA >60 09/25/2015 0519   GFRAA >60 09/25/2015 0519    Assessment and Plan  Dementia without behavioral disturbance SNF - cont aricept 5 mg; pt is still confused from this last hospitalization  BPH (benign prostatic hypertrophy) SNF - not stated as uncontrolled ; cont proscar  Gout SNF - no current problems; cont allopurinol    Webb Silversmith D. [name], MD

## 2015-11-17 ENCOUNTER — Encounter: Payer: Self-pay | Admitting: Internal Medicine

## 2015-12-10 ENCOUNTER — Non-Acute Institutional Stay (SKILLED_NURSING_FACILITY): Payer: Medicare Other | Admitting: Internal Medicine

## 2015-12-10 ENCOUNTER — Encounter: Payer: Self-pay | Admitting: Internal Medicine

## 2015-12-10 DIAGNOSIS — M1000 Idiopathic gout, unspecified site: Secondary | ICD-10-CM

## 2015-12-10 DIAGNOSIS — F0390 Unspecified dementia without behavioral disturbance: Secondary | ICD-10-CM

## 2015-12-10 DIAGNOSIS — R269 Unspecified abnormalities of gait and mobility: Secondary | ICD-10-CM

## 2015-12-10 NOTE — Progress Notes (Signed)
Location:   [facility] Room Number: 421/D Place of Service:  SNF [phone]) Provider:  Zannie Kehr., MD  Patient Care Team: Kirk Ruths, MD as PCP - General (Internal Medicine)  Extended Emergency Contact Information Primary Emergency Contact: Lowdermilk,[name] Address: [address]          [place],  [address] [name] of [place] Phone: [phone] Mobile Phone: [phone] Relation: Son  Code Status:  DNR Goals of care: Advanced Directive information Advanced Directives 12/10/2015  Does patient have an advance directive? Yes  Type of Advance Directive Out of facility DNR (pink MOST or yellow form)  Does patient want to make changes to advanced directive? No - Patient declined  Copy of advanced directive(s) in chart? Yes     Chief Complaint  Patient presents with  . Acute Visit     Patients c/o Fall patient was found on floor    HPI:  Pt is a 82 y.o. male seen today for an acute visit for Follow-up of a fall this weekend.  Apparently there were no injuries he was found apparently on his buttocks on the floor and apparently did not hit his head.  Currently patient is resting in bed comfortably is not complaining of any headache or pain of A. fib legs or extremities.  Vital signs appear to be stable nursing staff does not report any other issues at this time.  Patient does have a history of dementia apparently he is doing well with supportive care he continues on Aricept 5 mg a day.  Also has a history of gout but is not really complaining of being joint pain currently he is on Allopurinol     Past Medical History  Diagnosis Date  . Esophageal abnormality   . Gout   . Cancer [facility])    Past Surgical History  Procedure Laterality Date  . Joint replacement      Hip  . Esophagogastroduodenoscopy (egd) with propofol N/A 08/31/2015    Procedure: ESOPHAGOGASTRODUODENOSCOPY (EGD) WITH PROPOFOL;  Surgeon: Wonda Horner, MD;  [facility];  Service: Endoscopy;  Laterality: N/A;  . Esophagogastroduodenoscopy N/A 09/23/2015    Procedure: ESOPHAGOGASTRODUODENOSCOPY (EGD);  Surgeon: Arta Silence, MD;  [facility];  Service: Endoscopy;  Laterality: N/A;    No Known Allergies    Medication List       This list is accurate as of: 12/10/15 10:55 AM.  Always use your most recent med list.               allopurinol 300 MG tablet  Commonly known as:  ZYLOPRIM  Take 300 mg by mouth daily.     aspirin EC 81 MG tablet  Take 81 mg by mouth daily.     donepezil 5 MG tablet  Commonly known as:  ARICEPT  Take 5 mg by mouth daily.     ENSURE PLUS Liqd  Take 237 mLs by mouth 2 (two) times daily between meals.     feeding supplement (PRO-STAT SUGAR FREE 64) Liqd  Take 30 mLs by mouth daily.     finasteride 5 MG tablet  Commonly known as:  PROSCAR  Take 5 mg by mouth daily.     hyoscyamine 0.375 MG 12 hr tablet  Commonly known as:  LEVBID  Take 0.375 mg by mouth every 8 (eight) hours.     mirtazapine 15 MG tablet  Commonly known as:  REMERON  Give 15 mg by mouth Qtts  for depression may also improve sleep and appetite     MULTIVITAMIN ADULTS PO  Take 1 tablet by mouth daily.     omeprazole 40 MG capsule  Commonly known as:  PRILOSEC  Take 40 mg by mouth daily.     OXYGEN  Inhale 2 L into the lungs continuous.     polyethylene glycol packet  Commonly known as:  MIRALAX / GLYCOLAX  Take 17 g by mouth daily.        Review of Systems    limited secondary to dementia please see history of present illness  Immunization History  Administered Date(s) Administered  . PPD Test 09/08/2015, 09/26/2015   Pertinent  Health Maintenance Due  Topic Date Due  . PNA vac Low Risk Adult (1 of 2 - PCV13) 02/11/2016 (Originally 08/25/1981)  . INFLUENZA VACCINE  12/25/2015   No flowsheet data found. Functional Status Survey:    Filed Vitals:   12/10/15 1054  BP: 132/70  Pulse:  65  Temp: 97.8 F (36.6 C)  TempSrc: Oral  Resp: 18    Physical Exam GENERAL APPEARANCE: Alert, conversant, No acute distress lying in bed comfortably  SKIN: No diaphoresis rash HEENT: Unremarkable RESPIRATORY: Breathing is even, unlabored. Lung sounds are clear   CARDIOVASCULAR: Heart RRR no murmurs, rubs or gallops. No peripheral edema  GASTROINTESTINAL: Abdomen is soft, non-tender, not distended w/ normal bowel sounds.  GENITOURINARY: Bladder non tender, not distended  MUSCULOSKELETAL: kyphosis and arthritis-he is able to move all extremities 4 there is no pain with passive range of motion I was arm's her legs or flexion or extension of the hip-I did not note any acute appearing deformities NEUROLOGIC: Cranial nerves 2-12 grossly intact. Moves all extremities PSYCHIATRIC: confused, no behavioral issues pleasant and cooperative with exam Labs reviewed:  Recent Labs  09/07/15 0516 09/23/15 2019 09/24/15 0313 09/25/15 0519 10/02/15  NA 136 139 140 142 136*  K 3.9 3.8 3.7 4.3 4.1  CL 101 104 105 107  --   CO2 30 26 25 28   --   GLUCOSE 106* 104* 141* 94  --   BUN 18 15 15 16  27*  CREATININE 0.70 0.74 0.79 0.86 0.7  CALCIUM 8.9 11.3* 10.8* 10.8*  --   MG 2.0  --  1.9  --   --   PHOS  --   --  2.9  --   --     Recent Labs  09/04/15 0529 09/24/15 0313 09/25/15 0519  AST 15 25 20   ALT 11* 15* 12*  ALKPHOS 52 92 74  BILITOT 0.6 0.3 0.6  PROT 5.3* 7.0 6.3*  ALBUMIN 2.5* 3.7 3.2*    Recent Labs  08/29/15 2025 08/30/15 1533  09/23/15 2019 09/24/15 0313 09/25/15 0519 10/02/15  WBC 17.4* 20.1*  < > 9.4 14.9* 10.1 21.3  NEUTROABS 15.9* 18.8*  --   --   --   --   --   HGB 13.1 14.3  < > 13.1 13.3 11.7* 11.0*  HCT 40.3 42.9  < > 39.8 40.6 36.0* 34*  MCV 88.8 89.6  < > 88.6 88.8 89.8  --   PLT 209 243  < > 306 294 244 270  < > = values in this interval not displayed. No results found for: TSH No results found for: HGBA1C No results found for: CHOL, HDL, LDLCALC,  LDLDIRECT, TRIG, CHOLHDL  Significant Diagnostic Results in last 30 days:  No results found.  Assessment/Plan  #1 fall in nursing facility-I  do not note any apparent injury patient appears to be at his baseline per exam today at this point monitor for changes.  #2 history of dementia without behaviors this appears stable on Aricept 5 mg a day as well as supportive care.  #3 gout this appears to be asymptomatic he is on allopurinol.  [place], [place], [place]

## 2015-12-12 LAB — CBC AND DIFFERENTIAL
HEMATOCRIT: 37 % — AB (ref 41–53)
HEMOGLOBIN: 12.3 g/dL — AB (ref 13.5–17.5)
Platelets: 253 10*3/uL (ref 150–399)
WBC: 6 10^3/mL

## 2015-12-12 LAB — BASIC METABOLIC PANEL
BUN: 15 mg/dL (ref 4–21)
Creatinine: 0.9 mg/dL (ref 0.6–1.3)
GLUCOSE: 98 mg/dL
Potassium: 4.6 mmol/L (ref 3.4–5.3)
SODIUM: 134 mmol/L — AB (ref 137–147)

## 2015-12-12 LAB — HEPATIC FUNCTION PANEL
ALK PHOS: 89 U/L (ref 25–125)
ALT: 9 U/L — AB (ref 10–40)
AST: 21 U/L (ref 14–40)
Bilirubin, Total: 0.3 mg/dL

## 2016-01-02 ENCOUNTER — Encounter: Payer: Self-pay | Admitting: Internal Medicine

## 2016-01-02 ENCOUNTER — Non-Acute Institutional Stay (SKILLED_NURSING_FACILITY): Payer: Medicare Other | Admitting: Internal Medicine

## 2016-01-02 DIAGNOSIS — K224 Dyskinesia of esophagus: Secondary | ICD-10-CM

## 2016-01-02 DIAGNOSIS — K219 Gastro-esophageal reflux disease without esophagitis: Secondary | ICD-10-CM | POA: Diagnosis not present

## 2016-01-02 DIAGNOSIS — R1310 Dysphagia, unspecified: Secondary | ICD-10-CM

## 2016-01-02 NOTE — Progress Notes (Signed)
MRN: [id number] Name: Jack Cantrell  Sex: male Age: 82 y.o. DOB: 08-03-1914  [place] #:  Facility/Room: [facility] / [facility] D Level Of Care: SNF Provider: [name], MD Emergency Contacts: Extended Emergency Contact Information Primary Emergency Contact: Roseman,[name] Address: [address]          [place], [place] [address] [name] of [place] Phone: [phone] Mobile Phone: [phone] Relation: Son  Code Status: DNR  Allergies: Review of patient's allergies indicates no known allergies.  Chief Complaint  Patient presents with  . Medical Management of Chronic Issues    Routine Visit    HPI: Patient is 82 y.o. male who is being seen for routine issues of nutcracker esophagus, dysphagia and GERD.  Past Medical History:  Diagnosis Date  . Cancer ([place])   . Esophageal abnormality   . Gout     Past Surgical History:  Procedure Laterality Date  . ESOPHAGOGASTRODUODENOSCOPY N/A 09/23/2015   Procedure: ESOPHAGOGASTRODUODENOSCOPY (EGD);  Surgeon: Arta Silence, MD;  [facility];  Service: Endoscopy;  Laterality: N/A;  . ESOPHAGOGASTRODUODENOSCOPY (EGD) WITH PROPOFOL N/A 08/31/2015   Procedure: ESOPHAGOGASTRODUODENOSCOPY (EGD) WITH PROPOFOL;  Surgeon: Wonda Horner, MD;  [facility];  Service: Endoscopy;  Laterality: N/A;  . JOINT REPLACEMENT     Hip      Medication List       Accurate as of 01/02/16 11:59 PM. Always use your most recent med list.          allopurinol 300 MG tablet Commonly known as:  ZYLOPRIM Take 300 mg by mouth daily.   aspirin EC 81 MG tablet Take 81 mg by mouth daily.   donepezil 5 MG tablet Commonly known as:  ARICEPT Take 5 mg by mouth daily.   ENSURE PLUS Liqd Take 237 mLs by mouth 2 (two) times daily between meals.   feeding supplement (PRO-STAT SUGAR FREE 64) Liqd Take 30 mLs by mouth daily.   finasteride 5 MG tablet Commonly known as:  PROSCAR Take 5 mg by mouth daily.   hyoscyamine 0.375 MG 12 hr  tablet Commonly known as:  LEVBID Take 0.375 mg by mouth 3 (three) times daily. cramping   mirtazapine 15 MG tablet Commonly known as:  REMERON Give 15 mg by mouth for depression may also improve sleep and appetite   MULTIVITAMIN ADULTS PO Take 1 tablet by mouth daily.   omeprazole 40 MG capsule Commonly known as:  PRILOSEC Take 40 mg by mouth daily.   polyethylene glycol packet Commonly known as:  MIRALAX / GLYCOLAX Take 17 g by mouth daily. Hold for diarrhea       No orders of the defined types were placed in this encounter.   Immunization History  Administered Date(s) Administered  . PPD Test 09/08/2015, 09/26/2015    Social History  Substance Use Topics  . Smoking status: Former Research scientist (life sciences)  . Smokeless tobacco: Not on file  . Alcohol use No    Review of Systems  UTO 2/2 dementia; nurses report no problems    Vitals:   01/02/16 0956  BP: 130/71  Pulse: 89  Resp: (!) 22  Temp: 97.1 F (36.2 C)    Physical Exam  GENERAL APPEARANCE: Alert, mod conversant, No acute distress  SKIN: No diaphoresis rash HEENT: Unremarkable RESPIRATORY: Breathing is even, unlabored. Lung sounds are clear   CARDIOVASCULAR: Heart RRR no murmurs, rubs or gallops. No peripheral edema  GASTROINTESTINAL: Abdomen is soft, non-tender, not distended w/ normal bowel sounds.  GENITOURINARY:  Bladder non tender, not distended  MUSCULOSKELETAL: No abnormal joints or musculature NEUROLOGIC: Cranial nerves 2-12 grossly intact. Moves all extremities PSYCHIATRIC: Mood and affect appropriate to situation with dementia, no behavioral issues  Patient Active Problem List   Diagnosis Date Noted  . GERD (gastroesophageal reflux disease) 01/10/2016  . Abdominal pain 10/06/2015  . Acute encephalopathy 09/29/2015  . HTN (hypertension) 09/29/2015  . Altered mental status   . Acute respiratory failure ([place]) 09/24/2015  . Malnutrition of moderate degree 09/24/2015  . Dysphagia 09/16/2015  . Arm  swelling 09/16/2015  . Gout 09/16/2015  . Pressure ulcer 08/31/2015  . BPH (benign prostatic hypertrophy) 08/31/2015  . Dementia without behavioral disturbance 08/31/2015  . 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 Sepsis ([place]) 08/31/2015  . Pneumonia 08/30/2015  . CAP (community acquired pneumonia) 08/30/2015    CBC    Component Value Date/Time   WBC 6.0 12/12/2015   WBC 10.1 09/25/2015 0519   RBC 4.01 (L) 09/25/2015 0519   HGB 12.3 (A) 12/12/2015   HCT 37 (A) 12/12/2015   PLT 253 12/12/2015   MCV 89.8 09/25/2015 0519   LYMPHSABS 0.5 (L) 08/30/2015 1533   MONOABS 0.9 08/30/2015 1533   EOSABS 0.0 08/30/2015 1533   BASOSABS 0.0 08/30/2015 1533    CMP     Component Value Date/Time   NA 134 (A) 12/12/2015   K 4.6 12/12/2015   CL 107 09/25/2015 0519   CO2 28 09/25/2015 0519   GLUCOSE 94 09/25/2015 0519   BUN 15 12/12/2015   CREATININE 0.9 12/12/2015   CREATININE 0.86 09/25/2015 0519   CALCIUM 10.8 (H) 09/25/2015 0519   PROT 6.3 (L) 09/25/2015 0519   ALBUMIN 3.2 (L) 09/25/2015 0519   AST 21 12/12/2015   ALT 9 (A) 12/12/2015   ALKPHOS 89 12/12/2015   BILITOT 0.6 09/25/2015 0519   GFRNONAA >60 09/25/2015 0519   GFRAA >60 09/25/2015 0519    Assessment and Plan  Nutcracker esophagus/Achalasia/dysphagia Recurrent problem; s/p Dilation in the entire esophagus in late April 2017; Examination reported consistent with achalasia Pured, blenderized diet only. No solid component to food indefinitely; no reported problems with this diet and since dilitation  GERD (gastroesophageal reflux disease) Stable, no reflux reported; cont prilosec 40 mg daily   Kushal Saunders D.[name], MD

## 2016-01-10 ENCOUNTER — Encounter: Payer: Self-pay | Admitting: Internal Medicine

## 2016-01-10 DIAGNOSIS — K219 Gastro-esophageal reflux disease without esophagitis: Secondary | ICD-10-CM

## 2016-01-10 HISTORY — DX: Gastro-esophageal reflux disease without esophagitis: K21.9

## 2016-01-10 NOTE — Assessment & Plan Note (Addendum)
Stable, no reflux reported; cont prilosec 40 mg daily

## 2016-02-07 ENCOUNTER — Encounter: Payer: Self-pay | Admitting: Internal Medicine

## 2016-02-07 ENCOUNTER — Non-Acute Institutional Stay (SKILLED_NURSING_FACILITY): Payer: Medicare Other | Admitting: Internal Medicine

## 2016-02-07 DIAGNOSIS — D638 Anemia in other chronic diseases classified elsewhere: Secondary | ICD-10-CM | POA: Diagnosis not present

## 2016-02-07 DIAGNOSIS — I10 Essential (primary) hypertension: Secondary | ICD-10-CM | POA: Diagnosis not present

## 2016-02-07 DIAGNOSIS — K224 Dyskinesia of esophagus: Secondary | ICD-10-CM

## 2016-02-07 NOTE — Progress Notes (Signed)
MRN: [id number] Name: Jack Cantrell  Sex: male Age: 82 y.o. DOB: 07-19-1914  [place] #:  Facility/Room: [facility] / [id number] Level Of Care: SNF Provider: [name], MD Emergency Contacts: Extended Emergency Contact Information Primary Emergency Contact: Balthazar,[name] Address: [address]          [place], [place] [address] [name] of [place] Phone: [phone] Mobile Phone: [phone] Relation: Son  Code Status: DNR  Allergies: Review of patient's allergies indicates no known allergies.  Chief Complaint  Patient presents with  . Medical Management of Chronic Issues    Routine Visit    HPI: Patient is 82 y.o. male who is being seen for routine issues of nutcracker esophagus, anemia and HTN.  Past Medical History:  Diagnosis Date  . Cancer ([place])   . Esophageal abnormality   . Gout     Past Surgical History:  Procedure Laterality Date  . ESOPHAGOGASTRODUODENOSCOPY N/A 09/23/2015   Procedure: ESOPHAGOGASTRODUODENOSCOPY (EGD);  Surgeon: Arta Silence, MD;  [facility];  Service: Endoscopy;  Laterality: N/A;  . ESOPHAGOGASTRODUODENOSCOPY (EGD) WITH PROPOFOL N/A 08/31/2015   Procedure: ESOPHAGOGASTRODUODENOSCOPY (EGD) WITH PROPOFOL;  Surgeon: Wonda Horner, MD;  [facility];  Service: Endoscopy;  Laterality: N/A;  . JOINT REPLACEMENT     Hip      Medication List       Accurate as of 02/07/16 10:00 AM. Always use your most recent med list.          allopurinol 300 MG tablet Commonly known as:  ZYLOPRIM Take 300 mg by mouth daily.   aspirin EC 81 MG tablet Take 81 mg by mouth daily.   donepezil 5 MG tablet Commonly known as:  ARICEPT Take 5 mg by mouth daily.   ENSURE PLUS Liqd Take 237 mLs by mouth 2 (two) times daily between meals.   feeding supplement (PRO-STAT SUGAR FREE 64) Liqd Take 30 mLs by mouth daily.   finasteride 5 MG tablet Commonly known as:  PROSCAR Take 5 mg by mouth daily.   hyoscyamine 0.375 MG 12 hr  tablet Commonly known as:  LEVBID Take 0.375 mg by mouth 3 (three) times daily. cramping   mirtazapine 15 MG tablet Commonly known as:  REMERON Take 15 mg by mouth at bedtime. for depression may also improve sleep and appetite   MULTIVITAMIN ADULTS PO Take 1 tablet by mouth daily.   omeprazole 40 MG capsule Commonly known as:  PRILOSEC Take 40 mg by mouth daily.   OXYGEN Inhale into the lungs. Apply O2 at 2 liters/nasal cannula as needed to maintain O2 saturation of 90%   polyethylene glycol packet Commonly known as:  MIRALAX / GLYCOLAX Take 17 g by mouth daily. Hold for diarrhea       No orders of the defined types were placed in this encounter.   Immunization History  Administered Date(s) Administered  . PPD Test 09/08/2015, 09/26/2015    Social History  Substance Use Topics  . Smoking status: Former Research scientist (life sciences)  . Smokeless tobacco: Not on file  . Alcohol use No    Review of Systems  UTP 2/2 dementia; nursing without concerns    Vitals:   02/07/16 0957  BP: 137/72  Pulse: 63  Resp: 20  Temp: (!) 96.8 F (36 C)    Physical Exam  GENERAL APPEARANCE: Alert, mod conversant, No acute distress  SKIN: No diaphoresis rash HEENT: Unremarkable RESPIRATORY: Breathing is even, unlabored. Lung sounds are clear   CARDIOVASCULAR: Heart RRR no  murmurs, rubs or gallops. No peripheral edema  GASTROINTESTINAL: Abdomen is soft, non-tender, not distended w/ normal bowel sounds.  GENITOURINARY: Bladder non tender, not distended  MUSCULOSKELETAL: No abnormal joints or musculature NEUROLOGIC: Cranial nerves 2-12 grossly intact. Moves all extremities PSYCHIATRIC: Mood and affect appropriate to situation with dementia, no behavioral issues  Patient Active Problem List   Diagnosis Date Noted  . GERD (gastroesophageal reflux disease) 01/10/2016  . Abdominal pain 10/06/2015  . Acute encephalopathy 09/29/2015  . HTN (hypertension) 09/29/2015  . Altered mental status   . Acute  respiratory failure ([place]) 09/24/2015  . Malnutrition of moderate degree 09/24/2015  . Dysphagia 09/16/2015  . Arm swelling 09/16/2015  . Gout 09/16/2015  . Pressure ulcer 08/31/2015  . BPH (benign prostatic hypertrophy) 08/31/2015  . Dementia without behavioral disturbance 08/31/2015  . 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 Sepsis ([place]) 08/31/2015  . Pneumonia 08/30/2015  . CAP (community acquired pneumonia) 08/30/2015    CBC    Component Value Date/Time   WBC 6.0 12/12/2015   WBC 10.1 09/25/2015 0519   RBC 4.01 (L) 09/25/2015 0519   HGB 12.3 (A) 12/12/2015   HCT 37 (A) 12/12/2015   PLT 253 12/12/2015   MCV 89.8 09/25/2015 0519   LYMPHSABS 0.5 (L) 08/30/2015 1533   MONOABS 0.9 08/30/2015 1533   EOSABS 0.0 08/30/2015 1533   BASOSABS 0.0 08/30/2015 1533    CMP     Component Value Date/Time   NA 134 (A) 12/12/2015   K 4.6 12/12/2015   CL 107 09/25/2015 0519   CO2 28 09/25/2015 0519   GLUCOSE 94 09/25/2015 0519   BUN 15 12/12/2015   CREATININE 0.9 12/12/2015   CREATININE 0.86 09/25/2015 0519   CALCIUM 10.8 (H) 09/25/2015 0519   PROT 6.3 (L) 09/25/2015 0519   ALBUMIN 3.2 (L) 09/25/2015 0519   AST 21 12/12/2015   ALT 9 (A) 12/12/2015   ALKPHOS 89 12/12/2015   BILITOT 0.6 09/25/2015 0519   GFRNONAA >60 09/25/2015 0519   GFRAA >60 09/25/2015 0519    Assessment and Plan  No problem-specific Assessment & Plan notes found for this encounter.   [name], MD

## 2016-02-10 ENCOUNTER — Encounter: Payer: Self-pay | Admitting: Internal Medicine

## 2016-02-10 DIAGNOSIS — D638 Anemia in other chronic diseases classified elsewhere: Secondary | ICD-10-CM

## 2016-02-10 HISTORY — DX: Anemia in other chronic diseases classified elsewhere: D63.8

## 2016-02-10 NOTE — Assessment & Plan Note (Signed)
Most recent Hb 12.3 up from prior; remarkable in this age man;plan to cont to monitor at intervasls

## 2016-02-10 NOTE — Assessment & Plan Note (Signed)
Controlled on no medication; will to cont to monitor BPat intervals

## 2016-02-10 NOTE — Assessment & Plan Note (Signed)
Since dilatation of esophagus last hospitalization 09/2015 there have been no problems;plan  To continue pureed diet

## 2016-02-13 LAB — CBC AND DIFFERENTIAL
HCT: 39 % — AB (ref 41–53)
Hemoglobin: 12.8 g/dL — AB (ref 13.5–17.5)
Platelets: 214 10*3/uL (ref 150–399)
WBC: 7.4 10^3/mL

## 2016-02-13 LAB — LIPID PANEL
CHOLESTEROL: 159 mg/dL (ref 0–200)
HDL: 62 mg/dL (ref 35–70)
LDL Cholesterol: 82 mg/dL
TRIGLYCERIDES: 74 mg/dL (ref 40–160)

## 2016-02-13 LAB — PSA: PSA: 0.4

## 2016-02-13 LAB — HEMOGLOBIN A1C: Hemoglobin A1C: 5.1

## 2016-02-13 LAB — BASIC METABOLIC PANEL
BUN: 15 mg/dL (ref 4–21)
CREATININE: 0.7 mg/dL (ref 0.6–1.3)
Glucose: 77 mg/dL
POTASSIUM: 4.6 mmol/L (ref 3.4–5.3)
SODIUM: 135 mmol/L — AB (ref 137–147)

## 2016-02-13 LAB — TSH: TSH: 1.43 u[IU]/mL (ref 0.41–5.90)

## 2016-02-20 LAB — CBC AND DIFFERENTIAL
HEMATOCRIT: 38 % — AB (ref 41–53)
Hemoglobin: 12.6 g/dL — AB (ref 13.5–17.5)
PLATELETS: 266 10*3/uL (ref 150–399)
WBC: 10.4 10*3/mL

## 2016-02-20 LAB — HEMOGLOBIN A1C: Hemoglobin A1C: 6.2

## 2016-02-20 LAB — PSA: PSA: 5.1

## 2016-02-20 LAB — HEPATIC FUNCTION PANEL
ALK PHOS: 66 U/L (ref 25–125)
ALT: 8 U/L — AB (ref 10–40)
AST: 13 U/L — AB (ref 14–40)
BILIRUBIN, TOTAL: 0.2 mg/dL

## 2016-02-20 LAB — BASIC METABOLIC PANEL
BUN: 46 mg/dL — AB (ref 4–21)
Creatinine: 1.1 mg/dL (ref 0.6–1.3)
GLUCOSE: 102 mg/dL
Potassium: 4.6 mmol/L (ref 3.4–5.3)
Sodium: 136 mmol/L — AB (ref 137–147)

## 2016-02-20 LAB — LIPID PANEL
Cholesterol: 171 mg/dL (ref 0–200)
HDL: 27 mg/dL — AB (ref 35–70)
LDL CALC: 98 mg/dL
TRIGLYCERIDES: 232 mg/dL — AB (ref 40–160)

## 2016-03-05 ENCOUNTER — Non-Acute Institutional Stay (SKILLED_NURSING_FACILITY): Payer: Medicare Other | Admitting: Internal Medicine

## 2016-03-05 ENCOUNTER — Encounter: Payer: Self-pay | Admitting: Internal Medicine

## 2016-03-05 DIAGNOSIS — C44310 Basal cell carcinoma of skin of unspecified parts of face: Secondary | ICD-10-CM

## 2016-03-05 NOTE — Progress Notes (Signed)
Location:  [facility] Room Number: [id number] Place of Service:  SNF (31)  [name], MD  Patient Care Team: Kirk Ruths, MD as PCP - General (Internal Medicine)  Extended Emergency Contact Information Primary Emergency Contact: Popowski,[name] Address: [address]          [place], [place] [address] [name] of [place] Phone: [phone] Mobile Phone: [phone] Relation: Son    Allergies: Review of patient's allergies indicates no known allergies.  Chief Complaint  Patient presents with  . Acute Visit    Acute    HPI: Patient is 82 y.o. male who nursing asked me to see to make sure his facial wounds are OK. Pt has multiple basal cell CA removed from face several days ago. Pt denies discomfort except for R nare is sore.  Past Medical History:  Diagnosis Date  . Cancer ([place])   . Esophageal abnormality   . Gout     Past Surgical History:  Procedure Laterality Date  . ESOPHAGOGASTRODUODENOSCOPY N/A 09/23/2015   Procedure: ESOPHAGOGASTRODUODENOSCOPY (EGD);  Surgeon: Arta Silence, MD;  [facility]: [facility];  Service: Endoscopy;  Laterality: N/A;  . ESOPHAGOGASTRODUODENOSCOPY (EGD) WITH PROPOFOL N/A 08/31/2015   Procedure: ESOPHAGOGASTRODUODENOSCOPY (EGD) WITH PROPOFOL;  Surgeon: Wonda Horner, MD;  [facility];  Service: Endoscopy;  Laterality: N/A;  . JOINT REPLACEMENT     Hip      Medication List       Accurate as of 03/05/16  3:10 PM. Always use your most recent med list.          allopurinol 300 MG tablet Commonly known as:  ZYLOPRIM Take 300 mg by mouth daily.   aspirin EC 81 MG tablet Take 81 mg by mouth daily.   donepezil 5 MG tablet Commonly known as:  ARICEPT Take 5 mg by mouth daily.   ENSURE PLUS Liqd Take 237 mLs by mouth 2 (two) times daily between meals.   feeding supplement (PRO-STAT SUGAR FREE 64) Liqd Take 30 mLs by mouth daily.   finasteride 5 MG tablet Commonly known as:   PROSCAR Take 5 mg by mouth daily.   hyoscyamine 0.375 MG 12 hr tablet Commonly known as:  LEVBID Take 0.375 mg by mouth 3 (three) times daily. cramping   mirtazapine 15 MG tablet Commonly known as:  REMERON Take 15 mg by mouth at bedtime. for depression may also improve sleep and appetite   MULTIVITAMIN ADULTS PO Take 1 tablet by mouth daily.   omeprazole 40 MG capsule Commonly known as:  PRILOSEC Take 40 mg by mouth daily.   OXYGEN Inhale into the lungs. Apply O2 at 2 liters/nasal cannula as needed to maintain O2 saturation of 90%   polyethylene glycol packet Commonly known as:  MIRALAX / GLYCOLAX Take 17 g by mouth daily. Hold for diarrhea       No orders of the defined types were placed in this encounter.   Immunization History  Administered Date(s) Administered  . PPD Test 09/08/2015, 09/26/2015    Social History  Substance Use Topics  . Smoking status: Former Research scientist (life sciences)  . Smokeless tobacco: Not on file  . Alcohol use No    Review of Systems  DATA OBTAINED: from patient, nurse GENERAL:  no fevers, fatigue, appetite changes SKIN: No itching, rash; no pain except inside R nare HEENT: No complaint RESPIRATORY: No cough, wheezing, SOB CARDIAC: No chest pain, palpitations, lower extremity edema  GI: No abdominal pain, No N/V/D  or constipation, No heartburn or reflux  GU: No dysuria, frequency or urgency, or incontinence  MUSCULOSKELETAL: No unrelieved bone/joint pain NEUROLOGIC: No headache, dizziness  PSYCHIATRIC: No overt anxiety or sadness  Vitals:   03/05/16 1501  BP: 137/72  Pulse: 71  Resp: 18  Temp: 98.4 F (36.9 C)   Body mass index is 22.82 kg/m. Physical Exam  GENERAL APPEARANCE: Alert, mod conversant, No acute distress  SKIN: healing scabs, no redness or swelling, one is on mucous membrane of R nare HEENT: Unremarkable RESPIRATORY: Breathing is even, unlabored. Lung sounds are clear   CARDIOVASCULAR: Heart RRR no murmurs, rubs or  gallops. No peripheral edema  GASTROINTESTINAL: Abdomen is soft, non-tender, not distended w/ normal bowel sounds.  GENITOURINARY: Bladder non tender, not distended  MUSCULOSKELETAL: No abnormal joints or musculature NEUROLOGIC: Cranial nerves 2-12 grossly intact. Moves all extremities PSYCHIATRIC: Mood and affect appropriate to situation with dementia, no behavioral issues  Patient Active Problem List   Diagnosis Date Noted  . Anemia of chronic disease 02/10/2016  . GERD (gastroesophageal reflux disease) 01/10/2016  . Abdominal pain 10/06/2015  . Acute encephalopathy 09/29/2015  . HTN (hypertension) 09/29/2015  . Altered mental status   . Acute respiratory failure ([place]) 09/24/2015  . Malnutrition of moderate degree 09/24/2015  . Dysphagia 09/16/2015  . Arm swelling 09/16/2015  . Gout 09/16/2015  . Pressure ulcer 08/31/2015  . BPH (benign prostatic hypertrophy) 08/31/2015  . Dementia without behavioral disturbance 08/31/2015  . 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 Sepsis ([place]) 08/31/2015  . Pneumonia 08/30/2015  . CAP (community acquired pneumonia) 08/30/2015    CMP     Component Value Date/Time   NA 134 (A) 12/12/2015   K 4.6 12/12/2015   CL 107 09/25/2015 0519   CO2 28 09/25/2015 0519   GLUCOSE 94 09/25/2015 0519   BUN 15 12/12/2015   CREATININE 0.9 12/12/2015   CREATININE 0.86 09/25/2015 0519   CALCIUM 10.8 (H) 09/25/2015 0519   PROT 6.3 (L) 09/25/2015 0519   ALBUMIN 3.2 (L) 09/25/2015 0519   AST 21 12/12/2015   ALT 9 (A) 12/12/2015   ALKPHOS 89 12/12/2015   BILITOT 0.6 09/25/2015 0519   GFRNONAA >60 09/25/2015 0519   GFRAA >60 09/25/2015 0519    Recent Labs  09/07/15 0516 09/23/15 2019 09/24/15 0313 09/25/15 0519 10/02/15 12/12/15  NA 136 139 140 142 136* 134*  K 3.9 3.8 3.7 4.3 4.1 4.6  CL 101 104 105 107  --   --   CO2 30 26 25 28   --   --   GLUCOSE 106* 104* 141* 94  --   --   BUN 18 15 15 16  27* 15  CREATININE 0.70 0.74 0.79 0.86 0.7 0.9  CALCIUM  8.9 11.3* 10.8* 10.8*  --   --   MG 2.0  --  1.9  --   --   --   PHOS  --   --  2.9  --   --   --     Recent Labs  09/04/15 0529 09/24/15 0313 09/25/15 0519 12/12/15  AST 15 25 20 21   ALT 11* 15* 12* 9*  ALKPHOS 52 92 74 89  BILITOT 0.6 0.3 0.6  --   PROT 5.3* 7.0 6.3*  --   ALBUMIN 2.5* 3.7 3.2*  --     Recent Labs  08/29/15 2025 08/30/15 1533  09/23/15 2019 09/24/15 0313 09/25/15 0519 10/02/15 12/12/15  WBC 17.4* 20.1*  < > 9.4 14.9* 10.1 21.3 6.0  NEUTROABS 15.9* 18.8*  --   --   --   --   --   --   HGB 13.1 14.3  < > 13.1 13.3 11.7* 11.0* 12.3*  HCT 40.3 42.9  < > 39.8 40.6 36.0* 34* 37*  MCV 88.8 89.6  < > 88.6 88.8 89.8  --   --   PLT 209 243  < > 306 294 244 270 253  < > = values in this interval not displayed. No results for input(s): CHOL, LDLCALC, TRIG in the last 8760 hours.  Invalid input(s): HCL No results found for: MICROALBUR No results found for: TSH No results found for: HGBA1C No results found for: CHOL, HDL, LDLCALC, LDLDIRECT, TRIG, CHOLHDL  Significant Diagnostic Results in last 30 days:  No results found.  Assessment and Plan  BASAL CELL CA ON FACE- s/p removal; no signs of infection;will cont to monitor     Nimrod Wendt D. [name], MD

## 2016-03-07 ENCOUNTER — Non-Acute Institutional Stay (SKILLED_NURSING_FACILITY): Payer: Medicare Other | Admitting: Internal Medicine

## 2016-03-07 ENCOUNTER — Encounter: Payer: Self-pay | Admitting: Internal Medicine

## 2016-03-07 DIAGNOSIS — N400 Enlarged prostate without lower urinary tract symptoms: Secondary | ICD-10-CM | POA: Diagnosis not present

## 2016-03-07 DIAGNOSIS — M1000 Idiopathic gout, unspecified site: Secondary | ICD-10-CM

## 2016-03-07 DIAGNOSIS — F0390 Unspecified dementia without behavioral disturbance: Secondary | ICD-10-CM | POA: Diagnosis not present

## 2016-03-07 NOTE — Progress Notes (Signed)
Location:  [facility] Room Number: [id number] Place of Service:  SNF (31)  Jack Cantrell. [name], MD  Patient Care Team: Kirk Ruths, MD as PCP - General (Internal Medicine)  Extended Emergency Contact Information Primary Emergency Contact: Sedano,[name] Address: [address]          [place], [place] [address] [name] of [place] Phone: [phone] Mobile Phone: [phone] Relation: Son    Allergies: Review of patient's allergies indicates no known allergies.  Chief Complaint  Patient presents with  . Medical Management of Chronic Issues    Routine Visit    HPI: Patient is 82 y.o. male who is being seen for routine issues of dementia, , BPH and gout.  Past Medical History:  Diagnosis Date  . Cancer ([place])   . Esophageal abnormality   . Gout     Past Surgical History:  Procedure Laterality Date  . ESOPHAGOGASTRODUODENOSCOPY N/A 09/23/2015   Procedure: ESOPHAGOGASTRODUODENOSCOPY (EGD);  Surgeon: Arta Silence, MD;  [facility];  Service: Endoscopy;  Laterality: N/A;  . ESOPHAGOGASTRODUODENOSCOPY (EGD) WITH PROPOFOL N/A 08/31/2015   Procedure: ESOPHAGOGASTRODUODENOSCOPY (EGD) WITH PROPOFOL;  Surgeon: Wonda Horner, MD;  [facility];  Service: Endoscopy;  Laterality: N/A;  . JOINT REPLACEMENT     Hip      Medication List       Accurate as of 03/07/16 11:59 PM. Always use your most recent med list.          allopurinol 300 MG tablet Commonly known as:  ZYLOPRIM Take 300 mg by mouth daily.   aspirin EC 81 MG tablet Take 81 mg by mouth daily.   donepezil 5 MG tablet Commonly known as:  ARICEPT Take 5 mg by mouth daily.   ENSURE PLUS Liqd Take 237 mLs by mouth 2 (two) times daily between meals.   feeding supplement (PRO-STAT SUGAR FREE 64) Liqd Take 30 mLs by mouth daily.   finasteride 5 MG tablet Commonly known as:  PROSCAR Take 5 mg by mouth daily.   hyoscyamine 0.375 MG 12 hr  tablet Commonly known as:  LEVBID Take 0.375 mg by mouth 3 (three) times daily. cramping   mirtazapine 15 MG tablet Commonly known as:  REMERON Take 15 mg by mouth at bedtime. for depression may also improve sleep and appetite   MULTIVITAMIN ADULTS PO Take 1 tablet by mouth daily.   omeprazole 40 MG capsule Commonly known as:  PRILOSEC Take 40 mg by mouth daily.   OXYGEN Inhale into the lungs. Apply O2 at 2 liters/nasal cannula as needed to maintain O2 saturation of 90%   polyethylene glycol packet Commonly known as:  MIRALAX / GLYCOLAX Take 17 g by mouth daily. Hold for diarrhea       No orders of the defined types were placed in this encounter.   Immunization History  Administered Date(s) Administered  . PPD Test 09/08/2015, 09/26/2015    Social History  Substance Use Topics  . Smoking status: Former Research scientist (life sciences)  . Smokeless tobacco: Not on file  . Alcohol use No    Review of Systems  UTO 2/2 dementia; nursing without concerns    Vitals:   03/07/16 0923  BP: 137/72  Pulse: 71  Resp: 18  Temp: 98.4 F (36.9 C)   Body mass index is 23.49 kg/m. Physical Exam  GENERAL APPEARANCE: Alert, mod conversant, No acute distress  SKIN: healing wounds on face from basal cell Ca removal on  HEENT: Unremarkable RESPIRATORY:  Breathing is even, unlabored. Lung sounds are clear   CARDIOVASCULAR: Heart RRR no murmurs, rubs or gallops. No peripheral edema  GASTROINTESTINAL: Abdomen is soft, non-tender, not distended w/ normal bowel sounds.  GENITOURINARY: Bladder non tender, not distended  MUSCULOSKELETAL: No abnormal joints or musculature NEUROLOGIC: Cranial nerves 2-12 grossly intact. Moves all extremities PSYCHIATRIC: Mood and affect appropriate to situatiowith dementia, no behavioral issues  Patient Active Problem List   Diagnosis Date Noted  . Anemia of chronic disease 02/10/2016  . GERD (gastroesophageal reflux disease) 01/10/2016  . Abdominal pain 10/06/2015  .  Acute encephalopathy 09/29/2015  . HTN (hypertension) 09/29/2015  . Altered mental status   . Acute respiratory failure ([place]) 09/24/2015  . Malnutrition of moderate degree 09/24/2015  . Dysphagia 09/16/2015  . Arm swelling 09/16/2015  . Gout 09/16/2015  . Pressure ulcer 08/31/2015  . BPH without obstruction/lower urinary tract symptoms 08/31/2015  . Dementia without behavioral disturbance 08/31/2015  . 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 Sepsis ([place]) 08/31/2015  . Pneumonia 08/30/2015  . CAP (community acquired pneumonia) 08/30/2015    CMP     Component Value Date/Time   NA 136 (A) 02/20/2016   K 4.6 02/20/2016   CL 107 09/25/2015 0519   CO2 28 09/25/2015 0519   GLUCOSE 94 09/25/2015 0519   BUN 46 (A) 02/20/2016   CREATININE 1.1 02/20/2016   CREATININE 0.86 09/25/2015 0519   CALCIUM 10.8 (H) 09/25/2015 0519   PROT 6.3 (L) 09/25/2015 0519   ALBUMIN 3.2 (L) 09/25/2015 0519   AST 13 (A) 02/20/2016   ALT 8 (A) 02/20/2016   ALKPHOS 66 02/20/2016   BILITOT 0.6 09/25/2015 0519   GFRNONAA >60 09/25/2015 0519   GFRAA >60 09/25/2015 0519    Recent Labs  09/07/15 0516 09/23/15 2019 09/24/15 0313 09/25/15 0519 10/02/15 12/12/15 02/20/16  NA 136 139 140 142 136* 134* 136*  K 3.9 3.8 3.7 4.3 4.1 4.6 4.6  CL 101 104 105 107  --   --   --   CO2 30 26 25 28   --   --   --   GLUCOSE 106* 104* 141* 94  --   --   --   BUN 18 15 15 16  27* 15 46*  CREATININE 0.70 0.74 0.79 0.86 0.7 0.9 1.1  CALCIUM 8.9 11.3* 10.8* 10.8*  --   --   --   MG 2.0  --  1.9  --   --   --   --   PHOS  --   --  2.9  --   --   --   --     Recent Labs  09/04/15 0529 09/24/15 0313 09/25/15 0519 12/12/15 02/20/16  AST 15 25 20 21  13*  ALT 11* 15* 12* 9* 8*  ALKPHOS 52 92 74 89 66  BILITOT 0.6 0.3 0.6  --   --   PROT 5.3* 7.0 6.3*  --   --   ALBUMIN 2.5* 3.7 3.2*  --   --     Recent Labs  08/29/15 2025 08/30/15 1533  09/23/15 2019 09/24/15 0313 09/25/15 0519 10/02/15 12/12/15 02/20/16  WBC  17.4* 20.1*  < > 9.4 14.9* 10.1 21.3 6.0 10.4  NEUTROABS 15.9* 18.8*  --   --   --   --   --   --   --   HGB 13.1 14.3  < > 13.1 13.3 11.7* 11.0* 12.3* 12.6*  HCT 40.3 42.9  < > 39.8 40.6 36.0* 34* 37*  38*  MCV 88.8 89.6  < > 88.6 88.8 89.8  --   --   --   PLT 209 243  < > 306 294 244 270 253 266  < > = values in this interval not displayed.  Recent Labs  02/20/16  CHOL 171  LDLCALC 98  TRIG 232*   No results found for: MICROALBUR No results found for: TSH Lab Results  Component Value Date   HGBA1C 6.2 02/20/2016   Lab Results  Component Value Date   CHOL 171 02/20/2016   HDL 27 (A) 02/20/2016   LDLCALC 98 02/20/2016   TRIG 232 (A) 02/20/2016    Significant Diagnostic Results in last 30 days:  No results found.  Assessment and Plan  Dementia without behavioral disturbance Pt is at baseline, not oriented but apears content;plan to cont aricept 5 mg daily  BPH without obstruction/lower urinary tract symptoms No reported problems with retention or infection; cont proscar 5 mg daily  Gout Cont no reported flares;plan to cont allopurinol 300 mg daily    Jack Biederman D. [name], MD

## 2016-03-08 ENCOUNTER — Encounter: Payer: Self-pay | Admitting: Internal Medicine

## 2016-03-08 NOTE — Assessment & Plan Note (Signed)
No reported problems with retention or infection; cont proscar 5 mg daily

## 2016-03-08 NOTE — Assessment & Plan Note (Signed)
Pt is at baseline, not oriented but apears content;plan to cont aricept 5 mg daily

## 2016-03-08 NOTE — Assessment & Plan Note (Signed)
Cont no reported flares;plan to cont allopurinol 300 mg daily

## 2016-04-07 ENCOUNTER — Non-Acute Institutional Stay (SKILLED_NURSING_FACILITY): Payer: Medicare Other | Admitting: Internal Medicine

## 2016-04-07 ENCOUNTER — Encounter: Payer: Self-pay | Admitting: Internal Medicine

## 2016-04-07 DIAGNOSIS — D638 Anemia in other chronic diseases classified elsewhere: Secondary | ICD-10-CM

## 2016-04-07 DIAGNOSIS — K219 Gastro-esophageal reflux disease without esophagitis: Secondary | ICD-10-CM | POA: Diagnosis not present

## 2016-04-07 DIAGNOSIS — R1319 Other dysphagia: Secondary | ICD-10-CM

## 2016-04-07 NOTE — Progress Notes (Signed)
Location:  [facility] Room Number: [id number] Place of Service:  SNF (31)  Jack Delaine. [name], MD  Patient Care Team: Kirk Ruths, MD as PCP - General (Internal Medicine)  Extended Emergency Contact Information Primary Emergency Contact: Evon,[name] Address: [address]          [place], [place] [address] [name] of [place] Phone: [phone] Mobile Phone: [phone] Relation: Son    Allergies: Patient has no known allergies.  Chief Complaint  Patient presents with  . Medical Management of Chronic Issues    Routine Visit    HPI: Patient is 82 y.o. male who is being seen for routine issues of GERD, dysphagia and anemia of chronic disease.  Past Medical History:  Diagnosis Date  . Cancer ([place])   . Esophageal abnormality   . Gout     Past Surgical History:  Procedure Laterality Date  . ESOPHAGOGASTRODUODENOSCOPY N/A 09/23/2015   Procedure: ESOPHAGOGASTRODUODENOSCOPY (EGD);  Surgeon: Arta Silence, MD;  [facility];  Service: Endoscopy;  Laterality: N/A;  . ESOPHAGOGASTRODUODENOSCOPY (EGD) WITH PROPOFOL N/A 08/31/2015   Procedure: ESOPHAGOGASTRODUODENOSCOPY (EGD) WITH PROPOFOL;  Surgeon: Wonda Horner, MD;  [facility];  Service: Endoscopy;  Laterality: N/A;  . JOINT REPLACEMENT     Hip      Medication List       Accurate as of 04/07/16 11:59 PM. Always use your most recent med list.          allopurinol 300 MG tablet Commonly known as:  ZYLOPRIM Take 300 mg by mouth daily.   aspirin EC 81 MG tablet Take 81 mg by mouth daily.   donepezil 5 MG tablet Commonly known as:  ARICEPT Take 5 mg by mouth daily.   ENSURE PLUS Liqd Take 237 mLs by mouth 2 (two) times daily between meals.   feeding supplement (PRO-STAT SUGAR FREE 64) Liqd Take 30 mLs by mouth daily.   finasteride 5 MG tablet Commonly known as:  PROSCAR Take 5 mg by mouth daily.   hyoscyamine 0.375 MG 12 hr tablet Commonly  known as:  LEVBID Take 0.375 mg by mouth 3 (three) times daily. cramping   mirtazapine 15 MG tablet Commonly known as:  REMERON Take 15 mg by mouth at bedtime. for depression may also improve sleep and appetite   MULTIVITAMIN ADULTS PO Take 1 tablet by mouth daily.   omeprazole 40 MG capsule Commonly known as:  PRILOSEC Take 40 mg by mouth daily.   polyethylene glycol packet Commonly known as:  MIRALAX / GLYCOLAX Take 17 g by mouth daily. Hold for diarrhea       No orders of the defined types were placed in this encounter.   Immunization History  Administered Date(s) Administered  . Influenza-Unspecified 02/21/2016  . PPD Test 09/08/2015, 09/26/2015    Social History  Substance Use Topics  . Smoking status: Former Research scientist (life sciences)  . Smokeless tobacco: Not on file  . Alcohol use No    Review of Systems  DATA OBTAINED: from nurse GENERAL:  no fevers, fatigue, appetite changes SKIN: No itching, rash HEENT: No complaint RESPIRATORY: No cough, wheezing, SOB CARDIAC: No chest pain, palpitations, lower extremity edema  GI: No abdominal pain, No N/V/D or constipation, No heartburn or reflux  GU: No dysuria, frequency or urgency, or incontinence  MUSCULOSKELETAL: No unrelieved bone/joint pain NEUROLOGIC: No headache, dizziness  PSYCHIATRIC: No overt anxiety or sadness  Vitals:   04/07/16 1232  BP: 137/72  Pulse: 76  Resp: 19  Temp: 98.4 F (36.9 C)   Body mass index is 22.14 kg/m. Physical Exam  GENERAL APPEARANCE: Alert, min conversant, No acute distress  SKIN: No diaphoresis rash HEENT: Unremarkable RESPIRATORY: Breathing is even, unlabored. Lung sounds are clear   CARDIOVASCULAR: Heart RRR no murmurs, rubs or gallops. No peripheral edema  GASTROINTESTINAL: Abdomen is soft, non-tender, not distended w/ normal bowel sounds.  GENITOURINARY: Bladder non tender, not distended  MUSCULOSKELETAL: No abnormal joints or musculature NEUROLOGIC: Cranial nerves 2-12 grossly  intact. Moves all extremities PSYCHIATRIC: Mood and affect appropriate to situation with dementia, no behavioral issues  Patient Active Problem List   Diagnosis Date Noted  . Anemia of chronic disease 02/10/2016  . GERD (gastroesophageal reflux disease) 01/10/2016  . Abdominal pain 10/06/2015  . Acute encephalopathy 09/29/2015  . HTN (hypertension) 09/29/2015  . Altered mental status   . Acute respiratory failure ([place]) 09/24/2015  . Malnutrition of moderate degree 09/24/2015  . Dysphagia 09/16/2015  . Arm swelling 09/16/2015  . Gout 09/16/2015  . Pressure ulcer 08/31/2015  . BPH without obstruction/lower urinary tract symptoms 08/31/2015  . Dementia without behavioral disturbance 08/31/2015  . 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 Sepsis ([place]) 08/31/2015  . Pneumonia 08/30/2015  . CAP (community acquired pneumonia) 08/30/2015    CMP     Component Value Date/Time   NA 136 (A) 02/20/2016   K 4.6 02/20/2016   CL 107 09/25/2015 0519   CO2 28 09/25/2015 0519   GLUCOSE 94 09/25/2015 0519   BUN 46 (A) 02/20/2016   CREATININE 1.1 02/20/2016   CREATININE 0.86 09/25/2015 0519   CALCIUM 10.8 (H) 09/25/2015 0519   PROT 6.3 (L) 09/25/2015 0519   ALBUMIN 3.2 (L) 09/25/2015 0519   AST 13 (A) 02/20/2016   ALT 8 (A) 02/20/2016   ALKPHOS 66 02/20/2016   BILITOT 0.6 09/25/2015 0519   GFRNONAA >60 09/25/2015 0519   GFRAA >60 09/25/2015 0519    Recent Labs  09/07/15 0516 09/23/15 2019 09/24/15 0313 09/25/15 0519  12/12/15 02/13/16 02/20/16  NA 136 139 140 142  < > 134* 135* 136*  K 3.9 3.8 3.7 4.3  < > 4.6 4.6 4.6  CL 101 104 105 107  --   --   --   --   CO2 30 26 25 28   --   --   --   --   GLUCOSE 106* 104* 141* 94  --   --   --   --   BUN 18 15 15 16   < > 15 15 46*  CREATININE 0.70 0.74 0.79 0.86  < > 0.9 0.7 1.1  CALCIUM 8.9 11.3* 10.8* 10.8*  --   --   --   --   MG 2.0  --  1.9  --   --   --   --   --   PHOS  --   --  2.9  --   --   --   --   --   < > = values in this  interval not displayed.  Recent Labs  09/04/15 0529 09/24/15 0313 09/25/15 0519 12/12/15 02/20/16  AST 15 25 20 21  13*  ALT 11* 15* 12* 9* 8*  ALKPHOS 52 92 74 89 66  BILITOT 0.6 0.3 0.6  --   --   PROT 5.3* 7.0 6.3*  --   --   ALBUMIN 2.5* 3.7 3.2*  --   --     Recent Labs  08/29/15 2025  08/30/15 1533  09/23/15 2019 09/24/15 0313 09/25/15 0519  12/12/15 02/13/16 02/20/16  WBC 17.4* 20.1*  < > 9.4 14.9* 10.1  < > 6.0 7.4 10.4  NEUTROABS 15.9* 18.8*  --   --   --   --   --   --   --   --   HGB 13.1 14.3  < > 13.1 13.3 11.7*  < > 12.3* 12.8* 12.6*  HCT 40.3 42.9  < > 39.8 40.6 36.0*  < > 37* 39* 38*  MCV 88.8 89.6  < > 88.6 88.8 89.8  --   --   --   --   PLT 209 243  < > 306 294 244  < > 253 214 266  < > = values in this interval not displayed.  Recent Labs  02/13/16 02/20/16  CHOL 159 171  LDLCALC 82 98  TRIG 74 232*   No results found for: [facility] Lab Results  Component Value Date   TSH 1.43 02/13/2016   Lab Results  Component Value Date   HGBA1C 6.2 02/20/2016   Lab Results  Component Value Date   CHOL 171 02/20/2016   HDL 27 (A) 02/20/2016   LDLCALC 98 02/20/2016   TRIG 232 (A) 02/20/2016    Significant Diagnostic Results in last 30 days:  No results found.  Assessment and Plan  GERD (gastroesophageal reflux disease) Chronic and stable;plan to cont prilosec 20 mg now, has had GDR  Anemia of chronic disease Recent Hb 12.6, stable from prior;plan to monitor at intervals  Dysphagia Pt is still tolerating his diet without choking or reflux; plan to cont outeed diet and hycosamine BID    Jack Delaine. [name], MD

## 2016-04-12 ENCOUNTER — Encounter: Payer: Self-pay | Admitting: Internal Medicine

## 2016-04-12 NOTE — Assessment & Plan Note (Signed)
Pt is still tolerating his diet without choking or reflux; plan to cont outeed diet and hycosamine BID

## 2016-04-12 NOTE — Assessment & Plan Note (Signed)
Chronic and stable;plan to cont prilosec 20 mg now, has had [place]

## 2016-04-12 NOTE — Assessment & Plan Note (Signed)
Recent Hb 12.6, stable from prior;plan to monitor at intervals

## 2016-05-07 ENCOUNTER — Encounter: Payer: Self-pay | Admitting: Internal Medicine

## 2016-05-07 ENCOUNTER — Non-Acute Institutional Stay (SKILLED_NURSING_FACILITY): Payer: Medicare Other | Admitting: Internal Medicine

## 2016-05-07 DIAGNOSIS — M1000 Idiopathic gout, unspecified site: Secondary | ICD-10-CM

## 2016-05-07 DIAGNOSIS — F0390 Unspecified dementia without behavioral disturbance: Secondary | ICD-10-CM | POA: Diagnosis not present

## 2016-05-07 DIAGNOSIS — N400 Enlarged prostate without lower urinary tract symptoms: Secondary | ICD-10-CM | POA: Diagnosis not present

## 2016-05-07 NOTE — Progress Notes (Signed)
Location:  [facility] Room Number: [id number] Place of Service:  SNF (31)  Jack Cantrell. [name], MD  Patient Care Team: Kirk Ruths, MD as PCP - General (Internal Medicine)  Extended Emergency Contact Information Primary Emergency Contact: Stucker,[name] Address: [address]          [place], [place] [address] [name] of [place] Phone: [phone] Mobile Phone: [phone] Relation: Son    Allergies: Patient has no known allergies.  Chief Complaint  Patient presents with  . Medical Management of Chronic Issues    Routine Visit    HPI: Patient is 82 y.o. male who is bein seen for routine issue sof BPH, dementia, and gout.  Past Medical History:  Diagnosis Date  . Cancer ([place])   . Esophageal abnormality   . Gout     Past Surgical History:  Procedure Laterality Date  . ESOPHAGOGASTRODUODENOSCOPY N/A 09/23/2015   Procedure: ESOPHAGOGASTRODUODENOSCOPY (EGD);  Surgeon: Arta Silence, MD;  [facility];  Service: Endoscopy;  Laterality: N/A;  . ESOPHAGOGASTRODUODENOSCOPY (EGD) WITH PROPOFOL N/A 08/31/2015   Procedure: ESOPHAGOGASTRODUODENOSCOPY (EGD) WITH PROPOFOL;  Surgeon: Wonda Horner, MD;  [facility];  Service: Endoscopy;  Laterality: N/A;  . JOINT REPLACEMENT     Hip    Allergies as of 05/07/2016   No Known Allergies     Medication List       Accurate as of 05/07/16 11:59 PM. Always use your most recent med list.          allopurinol 300 MG tablet Commonly known as:  ZYLOPRIM Take 300 mg by mouth daily.   aspirin EC 81 MG tablet Take 81 mg by mouth daily.   donepezil 5 MG tablet Commonly known as:  ARICEPT Take 5 mg by mouth daily.   ENSURE PLUS Liqd Take 237 mLs by mouth 2 (two) times daily between meals.   feeding supplement (PRO-STAT SUGAR FREE 64) Liqd Take 30 mLs by mouth daily.   finasteride 5 MG tablet Commonly known as:  PROSCAR Take 5 mg by mouth daily.   hyoscyamine 0.375  MG 12 hr tablet Commonly known as:  LEVBID Take 0.375 mg by mouth 3 (three) times daily. cramping   mirtazapine 15 MG tablet Commonly known as:  REMERON Take 15 mg by mouth at bedtime. for depression may also improve sleep and appetite   MULTIVITAMIN ADULTS PO Take 1 tablet by mouth daily.   omeprazole 20 MG capsule Commonly known as:  PRILOSEC Take 20 mg by mouth daily.   polyethylene glycol packet Commonly known as:  MIRALAX / GLYCOLAX Take 17 g by mouth daily. Hold for diarrhea       No orders of the defined types were placed in this encounter.   Immunization History  Administered Date(s) Administered  . Influenza Split 04/28/2014  . Influenza-Unspecified 03/06/2012, 02/21/2016  . PPD Test 09/08/2015, 09/26/2015    Social History  Substance Use Topics  . Smoking status: Former Research scientist (life sciences)  . Smokeless tobacco: Not on file  . Alcohol use No    Review of Systems  UTO 2/2 dementia; nursing without concerns   Vitals:   05/07/16 0951  BP: 137/72  Pulse: 75  Resp: 19  Temp: 98.4 F (36.9 C)   Body mass index is 22 kg/m. Physical Exam  GENERAL APPEARANCE: Alert, min conversant, No acute distress  SKIN: No diaphoresis rash HEENT: Unremarkable RESPIRATORY: Breathing is even, unlabored. Lung sounds are clear   CARDIOVASCULAR: Heart RRR  no murmurs, rubs or gallops. No peripheral edema  GASTROINTESTINAL: Abdomen is soft, non-tender, not distended w/ normal bowel sounds.  GENITOURINARY: Bladder non tender, not distended  MUSCULOSKELETAL: No abnormal joints or musculature NEUROLOGIC: Cranial nerves 2-12 grossly intact. Moves all extremities PSYCHIATRIC: Mood and affect appropriate to situation with dementia, no behavioral issues  Patient Active Problem List   Diagnosis Date Noted  . Anemia of chronic disease 02/10/2016  . GERD (gastroesophageal reflux disease) 01/10/2016  . Abdominal pain 10/06/2015  . Acute encephalopathy 09/29/2015  . HTN (hypertension)  09/29/2015  . Altered mental status   . Acute respiratory failure ([place]) 09/24/2015  . Malnutrition of moderate degree 09/24/2015  . Dysphagia 09/16/2015  . Arm swelling 09/16/2015  . Gout 09/16/2015  . Pressure ulcer 08/31/2015  . BPH without obstruction/lower urinary tract symptoms 08/31/2015  . Dementia without behavioral disturbance 08/31/2015  . 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 Sepsis ([place]) 08/31/2015  . Pneumonia 08/30/2015  . CAP (community acquired pneumonia) 08/30/2015    CMP     Component Value Date/Time   NA 136 (A) 02/20/2016   K 4.6 02/20/2016   CL 107 09/25/2015 0519   CO2 28 09/25/2015 0519   GLUCOSE 94 09/25/2015 0519   BUN 46 (A) 02/20/2016   CREATININE 1.1 02/20/2016   CREATININE 0.86 09/25/2015 0519   CALCIUM 10.8 (H) 09/25/2015 0519   PROT 6.3 (L) 09/25/2015 0519   ALBUMIN 3.2 (L) 09/25/2015 0519   AST 13 (A) 02/20/2016   ALT 8 (A) 02/20/2016   ALKPHOS 66 02/20/2016   BILITOT 0.6 09/25/2015 0519   GFRNONAA >60 09/25/2015 0519   GFRAA >60 09/25/2015 0519    Recent Labs  09/07/15 0516 09/23/15 2019 09/24/15 0313 09/25/15 0519  12/12/15 02/13/16 02/20/16  NA 136 139 140 142  < > 134* 135* 136*  K 3.9 3.8 3.7 4.3  < > 4.6 4.6 4.6  CL 101 104 105 107  --   --   --   --   CO2 30 26 25 28   --   --   --   --   GLUCOSE 106* 104* 141* 94  --   --   --   --   BUN 18 15 15 16   < > 15 15 46*  CREATININE 0.70 0.74 0.79 0.86  < > 0.9 0.7 1.1  CALCIUM 8.9 11.3* 10.8* 10.8*  --   --   --   --   MG 2.0  --  1.9  --   --   --   --   --   PHOS  --   --  2.9  --   --   --   --   --   < > = values in this interval not displayed.  Recent Labs  09/04/15 0529 09/24/15 0313 09/25/15 0519 12/12/15 02/20/16  AST 15 25 20 21  13*  ALT 11* 15* 12* 9* 8*  ALKPHOS 52 92 74 89 66  BILITOT 0.6 0.3 0.6  --   --   PROT 5.3* 7.0 6.3*  --   --   ALBUMIN 2.5* 3.7 3.2*  --   --     Recent Labs  08/29/15 2025 08/30/15 1533  09/23/15 2019 09/24/15 0313  09/25/15 0519  12/12/15 02/13/16 02/20/16  WBC 17.4* 20.1*  < > 9.4 14.9* 10.1  < > 6.0 7.4 10.4  NEUTROABS 15.9* 18.8*  --   --   --   --   --   --   --   --  HGB 13.1 14.3  < > 13.1 13.3 11.7*  < > 12.3* 12.8* 12.6*  HCT 40.3 42.9  < > 39.8 40.6 36.0*  < > 37* 39* 38*  MCV 88.8 89.6  < > 88.6 88.8 89.8  --   --   --   --   PLT 209 243  < > 306 294 244  < > 253 214 266  < > = values in this interval not displayed.  Recent Labs  02/13/16 02/20/16  CHOL 159 171  LDLCALC 82 98  TRIG 74 232*   No results found for: [facility] Lab Results  Component Value Date   TSH 1.43 02/13/2016   Lab Results  Component Value Date   HGBA1C 6.2 02/20/2016   Lab Results  Component Value Date   CHOL 171 02/20/2016   HDL 27 (A) 02/20/2016   LDLCALC 98 02/20/2016   TRIG 232 (A) 02/20/2016    Significant Diagnostic Results in last 30 days:  No results found.  Assessment and Plan  BPH without obstruction/lower urinary tract symptoms No reported problems of infection or retention;plan to cont proscar 5 mg daily  Dementia without behavioral disturbance Chronic and stable without major declines;plan to cont aricept 5 mg qHS  Gout No reported flares;plan to cont allopurinol 300 mg daily    Vala Raffo D. [name], MD

## 2016-05-24 IMAGING — CT CT HEAD W/O CM
1 series · 16 of 30 positions shown, 20 images · non-contrast
Comparison: 08/29/2015

CLINICAL DATA: Altered mental status.  Pupils are not equal.

EXAM:
CT HEAD WITHOUT CONTRAST
TECHNIQUE: Contiguous axial images were obtained from the base of the skull
through the vertex without intravenous contrast.

[Series 2: headseq 4.8 h45s · axial · 0.45mm/px · z∈[-67,+85]mm · 16 of 36 slices shown, 20 images]
[im 2/36  brain]
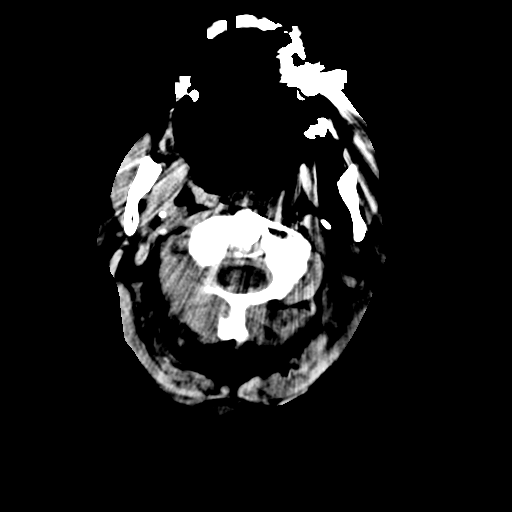
[im 2/36  bone]
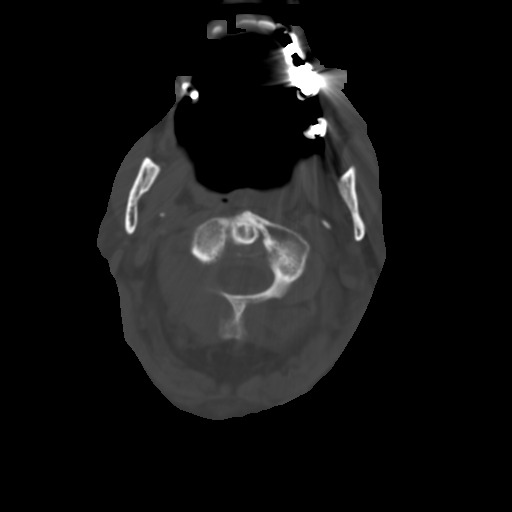
[im 4/36  brain]
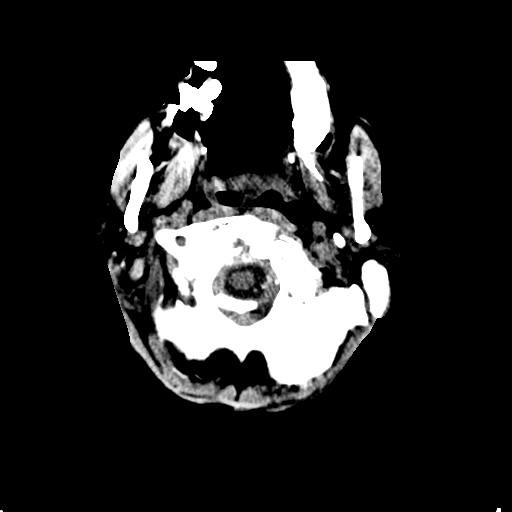
[im 7/36  brain]
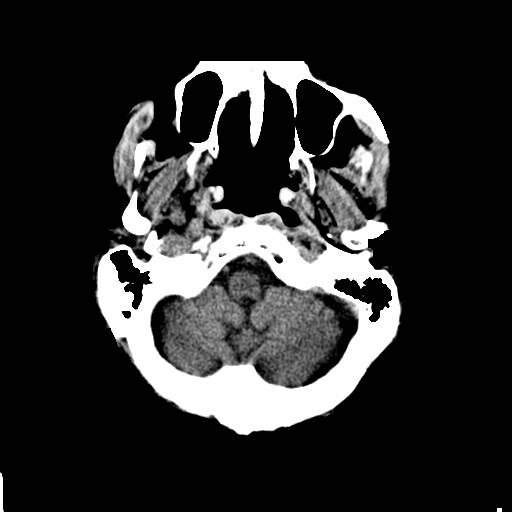
[im 9/36  brain]
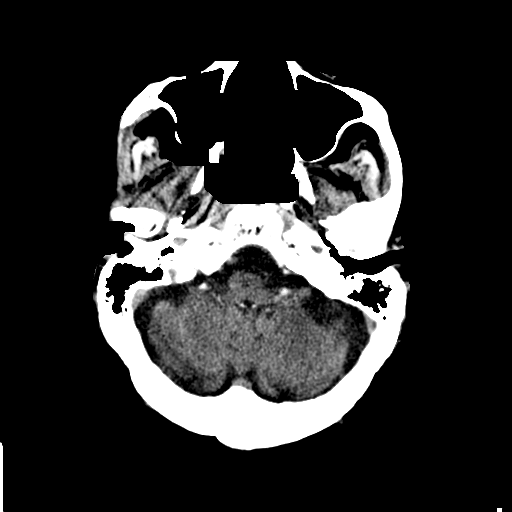
[im 10/36  brain]
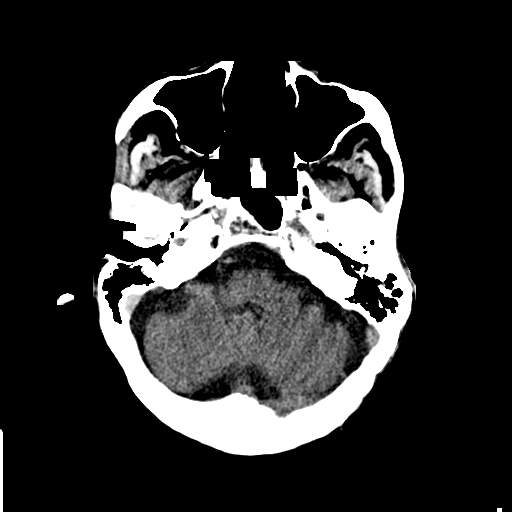
[im 10/36  bone]
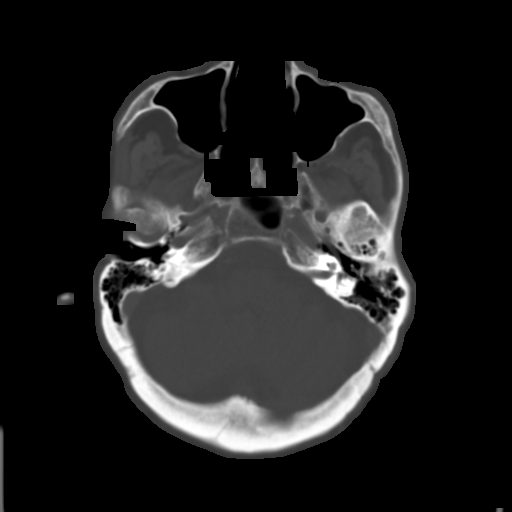
[im 13/36  brain]
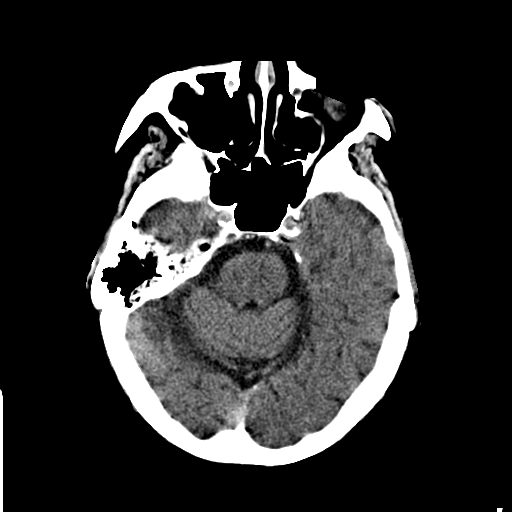
[im 15/36  brain]
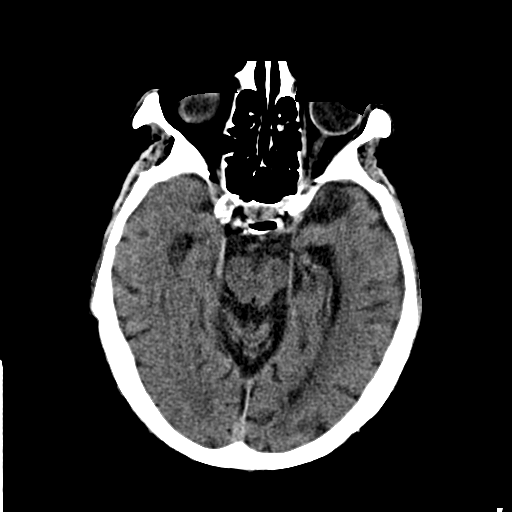
[im 17/36  brain]
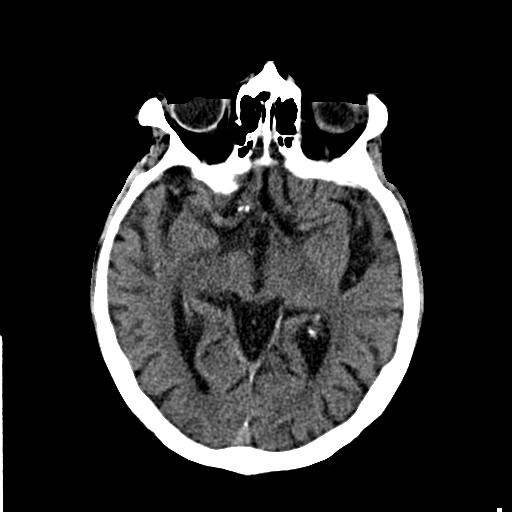
[im 19/36  brain]
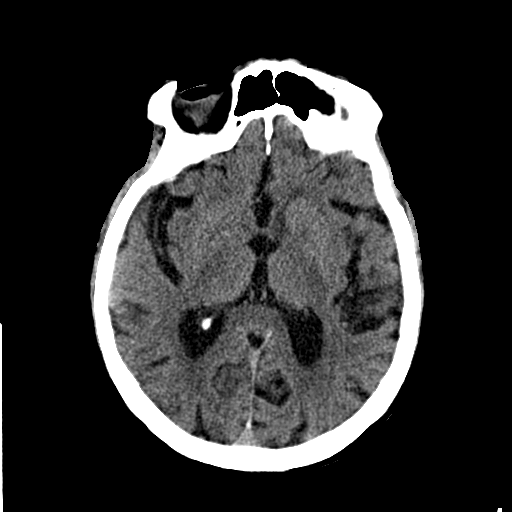
[im 19/36  bone]
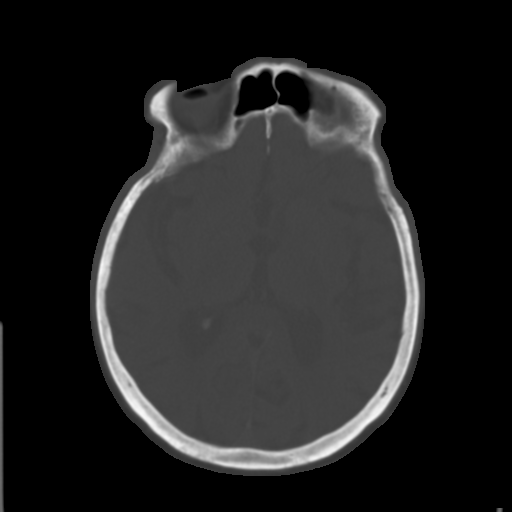
[im 21/36  brain]
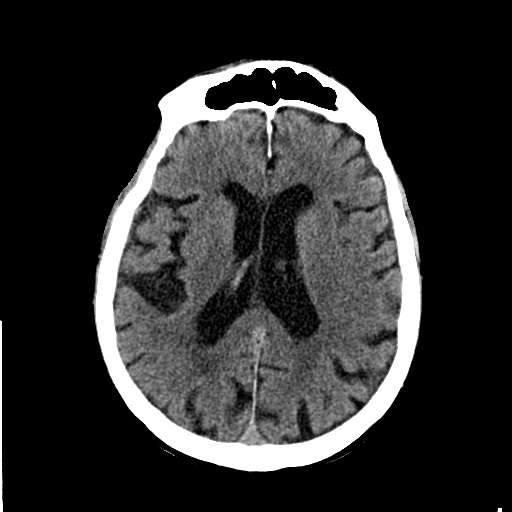
[im 23/36  brain]
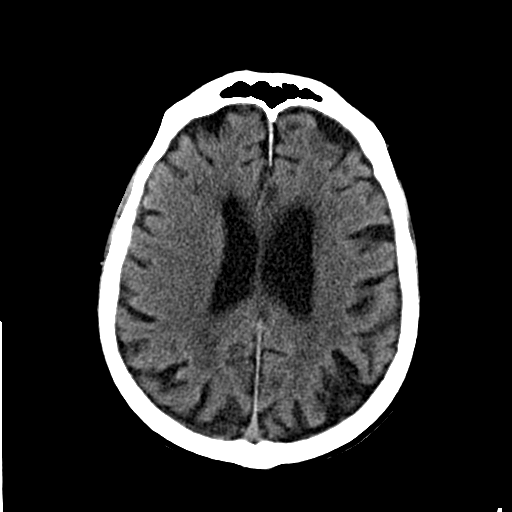
[im 26/36  brain]
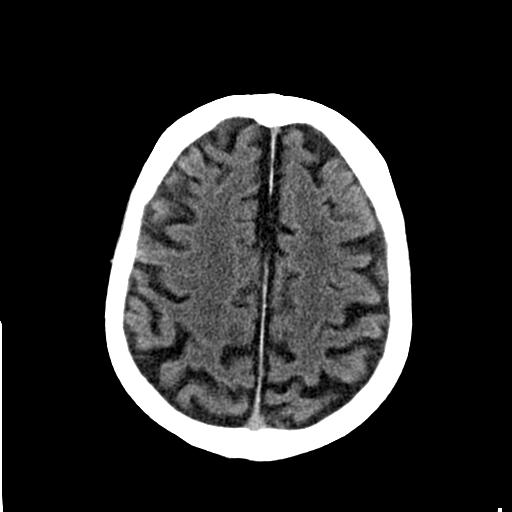
[im 27/36  brain]
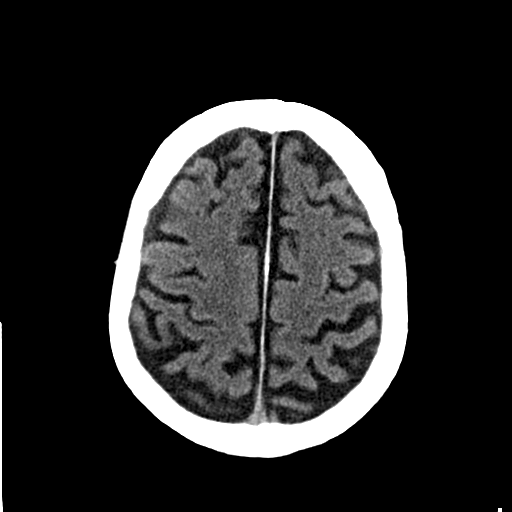
[im 27/36  bone]
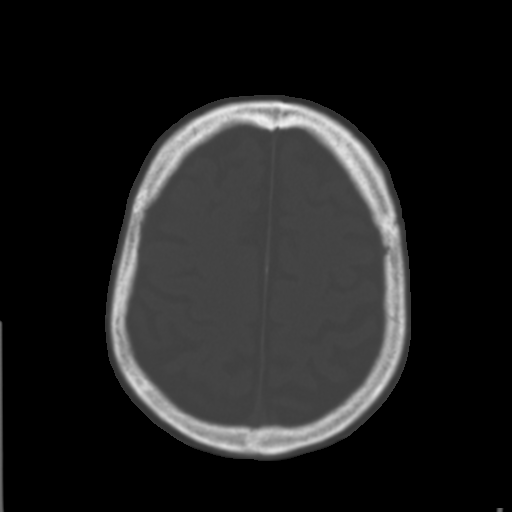
[im 29/36  brain]
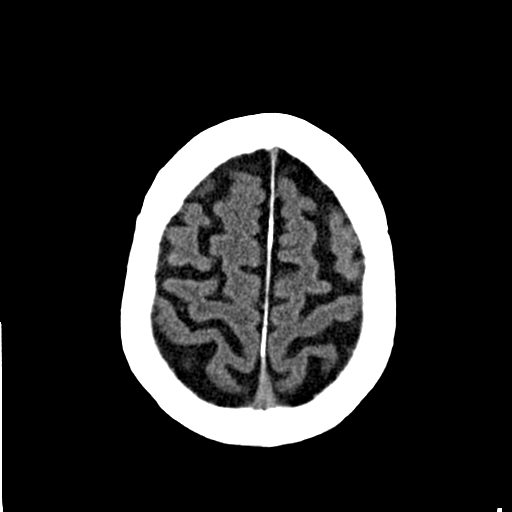
[im 32/36  brain]
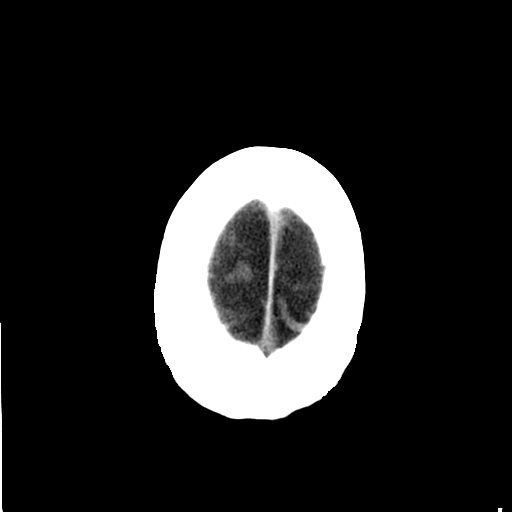
[im 34/36  brain]
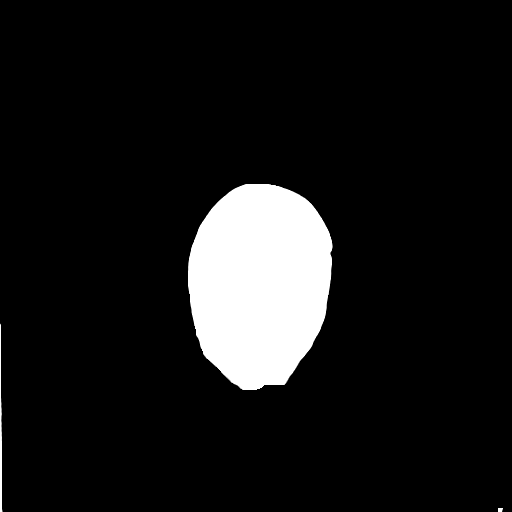

[16 of 30 positions shown; findings below may reference images not displayed]

FINDINGS: Diffuse cerebral atrophy. Mild ventricular dilatation consistent
with central atrophy. Low-attenuation changes in the deep white
matter consistent small vessel ischemia. No mass effect or midline
shift. No abnormal extra-axial fluid collections. Gray-white matter
junctions are distinct. Basal cisterns are not effaced. No evidence
of acute intracranial hemorrhage. No depressed skull fractures.
Visualized paranasal sinuses and mastoid air cells are not
opacified. Vascular calcifications.
IMPRESSION: No acute intracranial abnormalities. Chronic atrophy and small
vessel ischemic.

## 2016-05-25 IMAGING — DX DG CHEST 1V PORT
1 series · 1 of 1 positions shown · non-contrast
Comparison: 09/24/2015.  09/23/2015.

CLINICAL DATA: Pneumonia.

EXAM:
PORTABLE CHEST 1 VIEW

[chest ap]
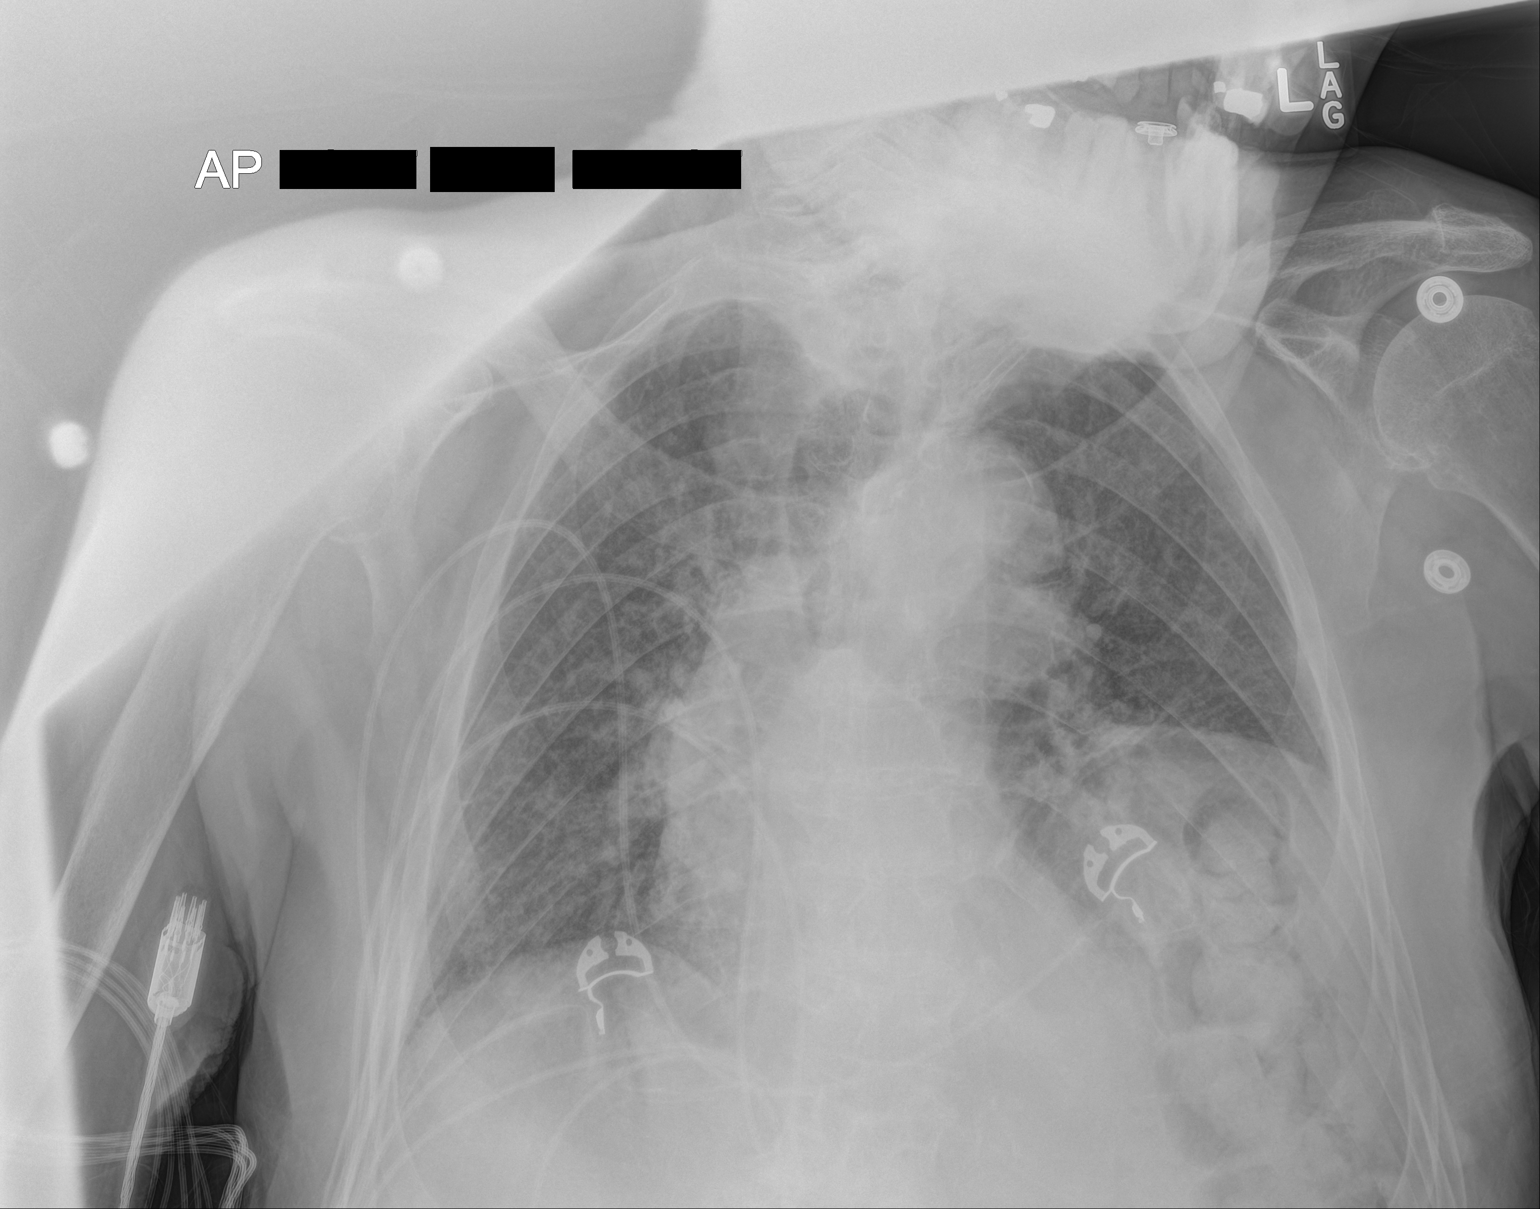

[1 of 1 positions shown; findings below may reference images not displayed]

FINDINGS: Mediastinum and hilar structures are normal . Mild bilateral
interstitial prominence again noted. Mild pneumonitis cannot be
excluded . Left lower lobe subsegmental atelectasis and or
infiltrate. No pleural effusion or pneumothorax. Elevation left
hemidiaphragm.
IMPRESSION: 1. Persistent mild bilateral interstitial prominence, mild
pneumonitis cannot be excluded.

2. Left lower lobe subsegmental atelectasis. Elevation left
hemidiaphragm.

## 2016-06-01 ENCOUNTER — Encounter: Payer: Self-pay | Admitting: Internal Medicine

## 2016-06-01 NOTE — Assessment & Plan Note (Signed)
No reported flares;plan to cont allopurinol 300 mg daily

## 2016-06-01 NOTE — Assessment & Plan Note (Signed)
No reported problems of infection or retention;plan to cont proscar 5 mg daily

## 2016-06-01 NOTE — Assessment & Plan Note (Signed)
Chronic and stable without major declines;plan to cont aricept 5 mg qHS

## 2016-06-16 ENCOUNTER — Encounter: Payer: Self-pay | Admitting: Internal Medicine

## 2016-06-16 ENCOUNTER — Non-Acute Institutional Stay (SKILLED_NURSING_FACILITY): Payer: Medicare Other | Admitting: Internal Medicine

## 2016-06-16 DIAGNOSIS — D638 Anemia in other chronic diseases classified elsewhere: Secondary | ICD-10-CM

## 2016-06-16 DIAGNOSIS — I10 Essential (primary) hypertension: Secondary | ICD-10-CM

## 2016-06-16 DIAGNOSIS — R1319 Other dysphagia: Secondary | ICD-10-CM

## 2016-06-16 NOTE — Progress Notes (Signed)
Location:  [facility] Room Number: [id number] Place of Service:  SNF (31)  [name], MD  Patient Care Team: Kirk Ruths, MD as PCP - General (Internal Medicine)  Extended Emergency Contact Information Primary Emergency Contact: Fournier,[name] Address: [address]          [place], [place] [address] [name] of [place] Phone: [phone] Mobile Phone: [phone] Relation: Son    Allergies: Patient has no known allergies.  Chief Complaint  Patient presents with  . Medical Management of Chronic Issues    Routine Visit    HPI: Patient is 83 y.o. male who is being seen for routine issues of anemia, dysphagia and HTN.  Past Medical History:  Diagnosis Date  . Cancer ([place])   . Esophageal abnormality   . Gout     Past Surgical History:  Procedure Laterality Date  . ESOPHAGOGASTRODUODENOSCOPY N/A 09/23/2015   Procedure: ESOPHAGOGASTRODUODENOSCOPY (EGD);  Surgeon: Arta Silence, MD;  Location[facility];  Service: Endoscopy;  Laterality: N/A;  . ESOPHAGOGASTRODUODENOSCOPY (EGD) WITH PROPOFOL N/A 08/31/2015   Procedure: ESOPHAGOGASTRODUODENOSCOPY (EGD) WITH PROPOFOL;  Surgeon: Wonda Horner, MD;  [facility];  Service: Endoscopy;  Laterality: N/A;  . JOINT REPLACEMENT     Hip    Allergies as of 06/16/2016   No Known Allergies     Medication List       Accurate as of 06/16/16 11:59 PM. Always use your most recent med list.          allopurinol 300 MG tablet Commonly known as:  ZYLOPRIM Take 300 mg by mouth daily.   aspirin EC 81 MG tablet Take 81 mg by mouth daily.   donepezil 5 MG tablet Commonly known as:  ARICEPT Take 5 mg by mouth daily.   ENSURE PLUS Liqd Take 237 mLs by mouth 2 (two) times daily between meals.   feeding supplement (PRO-STAT SUGAR FREE 64) Liqd Take 30 mLs by mouth daily.   finasteride 5 MG tablet Commonly known as:  PROSCAR Take 5 mg by mouth daily.   hyoscyamine 0.375  MG 12 hr tablet Commonly known as:  LEVBID Take 0.375 mg by mouth 3 (three) times daily. cramping   mirtazapine 15 MG tablet Commonly known as:  REMERON Take 15 mg by mouth at bedtime. for depression may also improve sleep and appetite   MULTIVITAMIN ADULTS PO Take 1 tablet by mouth daily.   omeprazole 20 MG capsule Commonly known as:  PRILOSEC Take 20 mg by mouth daily.   polyethylene glycol packet Commonly known as:  MIRALAX / GLYCOLAX Take 17 g by mouth daily. Hold for diarrhea       No orders of the defined types were placed in this encounter.   Immunization History  Administered Date(s) Administered  . Influenza Split 04/28/2014  . Influenza-Unspecified 03/06/2012, 02/21/2016  . PPD Test 09/08/2015, 09/26/2015    Social History  Substance Use Topics  . Smoking status: Former Research scientist (life sciences)  . Smokeless tobacco: Never Used  . Alcohol use No    Review of Systems  UTO 2/2 dementia; nursing without concerns    Vitals:   06/16/16 1101  BP: 137/72  Pulse: 64  Resp: 20  Temp: 97.8 F (36.6 C)   Body mass index is 21.82 kg/m. Physical Exam  GENERAL APPEARANCE: Alert, min, conversant, No acute distress , wearing his usual cap SKIN: No diaphoresis rash HEENT: Unremarkable RESPIRATORY: Breathing is even, unlabored. Lung sounds are clear  CARDIOVASCULAR: Heart RRR no murmurs, rubs or gallops. No peripheral edema  GASTROINTESTINAL: Abdomen is soft, non-tender, not distended w/ normal bowel sounds.  GENITOURINARY: Bladder non tender, not distended  MUSCULOSKELETAL: No abnormal joints or musculature NEUROLOGIC: Cranial nerves 2-12 grossly intact. Moves all extremities PSYCHIATRIC: Mood and affect appropriate to situation with dementia, no behavioral issues  Patient Active Problem List   Diagnosis Date Noted  . Anemia of chronic disease 02/10/2016  . GERD (gastroesophageal reflux disease) 01/10/2016  . Abdominal pain 10/06/2015  . Acute encephalopathy 09/29/2015    . HTN (hypertension) 09/29/2015  . Altered mental status   . Acute respiratory failure ([place]) 09/24/2015  . Malnutrition of moderate degree 09/24/2015  . Dysphagia 09/16/2015  . Arm swelling 09/16/2015  . Gout 09/16/2015  . Pressure ulcer 08/31/2015  . BPH without obstruction/lower urinary tract symptoms 08/31/2015  . Dementia without behavioral disturbance 08/31/2015  . 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 Sepsis ([place]) 08/31/2015  . Pneumonia 08/30/2015  . CAP (community acquired pneumonia) 08/30/2015    CMP     Component Value Date/Time   NA 136 (A) 02/20/2016   K 4.6 02/20/2016   CL 107 09/25/2015 0519   CO2 28 09/25/2015 0519   GLUCOSE 94 09/25/2015 0519   BUN 46 (A) 02/20/2016   CREATININE 1.1 02/20/2016   CREATININE 0.86 09/25/2015 0519   CALCIUM 10.8 (H) 09/25/2015 0519   PROT 6.3 (L) 09/25/2015 0519   ALBUMIN 3.2 (L) 09/25/2015 0519   AST 13 (A) 02/20/2016   ALT 8 (A) 02/20/2016   ALKPHOS 66 02/20/2016   BILITOT 0.6 09/25/2015 0519   GFRNONAA >60 09/25/2015 0519   GFRAA >60 09/25/2015 0519    Recent Labs  09/07/15 0516 09/23/15 2019 09/24/15 0313 09/25/15 0519  12/12/15 02/13/16 02/20/16  NA 136 139 140 142  < > 134* 135* 136*  K 3.9 3.8 3.7 4.3  < > 4.6 4.6 4.6  CL 101 104 105 107  --   --   --   --   CO2 30 26 25 28   --   --   --   --   GLUCOSE 106* 104* 141* 94  --   --   --   --   BUN 18 15 15 16   < > 15 15 46*  CREATININE 0.70 0.74 0.79 0.86  < > 0.9 0.7 1.1  CALCIUM 8.9 11.3* 10.8* 10.8*  --   --   --   --   MG 2.0  --  1.9  --   --   --   --   --   PHOS  --   --  2.9  --   --   --   --   --   < > = values in this interval not displayed.  Recent Labs  09/04/15 0529 09/24/15 0313 09/25/15 0519 12/12/15 02/20/16  AST 15 25 20 21  13*  ALT 11* 15* 12* 9* 8*  ALKPHOS 52 92 74 89 66  BILITOT 0.6 0.3 0.6  --   --   PROT 5.3* 7.0 6.3*  --   --   ALBUMIN 2.5* 3.7 3.2*  --   --     Recent Labs  08/29/15 2025 08/30/15 1533  09/23/15 2019  09/24/15 0313 09/25/15 0519  12/12/15 02/13/16 02/20/16  WBC 17.4* 20.1*  < > 9.4 14.9* 10.1  < > 6.0 7.4 10.4  NEUTROABS 15.9* 18.8*  --   --   --   --   --   --   --   --  HGB 13.1 14.3  < > 13.1 13.3 11.7*  < > 12.3* 12.8* 12.6*  HCT 40.3 42.9  < > 39.8 40.6 36.0*  < > 37* 39* 38*  MCV 88.8 89.6  < > 88.6 88.8 89.8  --   --   --   --   PLT 209 243  < > 306 294 244  < > 253 214 266  < > = values in this interval not displayed.  Recent Labs  02/13/16 02/20/16  CHOL 159 171  LDLCALC 82 98  TRIG 74 232*   No results found for: [facility] Lab Results  Component Value Date   TSH 1.43 02/13/2016   Lab Results  Component Value Date   HGBA1C 6.2 02/20/2016   Lab Results  Component Value Date   CHOL 171 02/20/2016   HDL 27 (A) 02/20/2016   LDLCALC 98 02/20/2016   TRIG 232 (A) 02/20/2016    Significant Diagnostic Results in last 30 days:  No results found.  Assessment and Plan  Anemia of chronic disease Hb 12.6, stable; very good for someone his age; monitoring q 6 months  Dysphagia Pt tolerating pureed diet well; cont Levbis 0.375 mg q 12  HTN (hypertension) Controlled on no meds; monitored monthy     Anne D. [name], MD

## 2016-06-25 ENCOUNTER — Non-Acute Institutional Stay (SKILLED_NURSING_FACILITY): Payer: Medicare Other | Admitting: Internal Medicine

## 2016-06-25 DIAGNOSIS — Z20828 Contact with and (suspected) exposure to other viral communicable diseases: Secondary | ICD-10-CM | POA: Diagnosis not present

## 2016-07-05 ENCOUNTER — Encounter: Payer: Self-pay | Admitting: Internal Medicine

## 2016-07-05 NOTE — Assessment & Plan Note (Signed)
Controlled on no meds; monitored monthy

## 2016-07-05 NOTE — Assessment & Plan Note (Signed)
Hb 12.6, stable; very good for someone his age; monitoring q 6 months

## 2016-07-05 NOTE — Assessment & Plan Note (Signed)
Pt tolerating pureed diet well; cont Levbis 0.375 mg q 12

## 2016-07-14 ENCOUNTER — Encounter: Payer: Self-pay | Admitting: Internal Medicine

## 2016-07-14 ENCOUNTER — Non-Acute Institutional Stay (SKILLED_NURSING_FACILITY): Payer: Medicare Other | Admitting: Internal Medicine

## 2016-07-14 DIAGNOSIS — N400 Enlarged prostate without lower urinary tract symptoms: Secondary | ICD-10-CM | POA: Diagnosis not present

## 2016-07-14 DIAGNOSIS — K224 Dyskinesia of esophagus: Secondary | ICD-10-CM | POA: Diagnosis not present

## 2016-07-14 DIAGNOSIS — K219 Gastro-esophageal reflux disease without esophagitis: Secondary | ICD-10-CM | POA: Diagnosis not present

## 2016-07-14 NOTE — Progress Notes (Signed)
Location:  [facility] Room Number: [id number] Place of Service:  SNF (31)  [name], MD  Patient Care Team: Kirk Ruths, MD as PCP - General (Internal Medicine)  Extended Emergency Contact Information Primary Emergency Contact: Seth,[name] Address: [address]          [place],  [address] [name] of [place] Phone: [phone] Mobile Phone: [phone] Relation: Son    Allergies: Patient has no known allergies.  Chief Complaint  Patient presents with  . Medical Management of Chronic Issues    Routine Visit    HPI: Patient is 83 y.o. male who is being seen for routine issues of nutcracker esophagus, GERD and BPH.  Past Medical History:  Diagnosis Date  . Cancer ([place])   . Esophageal abnormality   . Gout     Past Surgical History:  Procedure Laterality Date  . ESOPHAGOGASTRODUODENOSCOPY N/A 09/23/2015   Procedure: ESOPHAGOGASTRODUODENOSCOPY (EGD);  Surgeon: Arta Silence, MD;  [facility];  Service: Endoscopy;  Laterality: N/A;  . ESOPHAGOGASTRODUODENOSCOPY (EGD) WITH PROPOFOL N/A 08/31/2015   Procedure: ESOPHAGOGASTRODUODENOSCOPY (EGD) WITH PROPOFOL;  Surgeon: Wonda Horner, MD;  [facility];  Service: Endoscopy;  Laterality: N/A;  . JOINT REPLACEMENT     Hip    Allergies as of 07/14/2016   No Known Allergies     Medication List       Accurate as of 07/14/16 11:59 PM. Always use your most recent med list.          allopurinol 300 MG tablet Commonly known as:  ZYLOPRIM Take 300 mg by mouth daily.   aspirin EC 81 MG tablet Take 81 mg by mouth daily.   donepezil 5 MG tablet Commonly known as:  ARICEPT Take 5 mg by mouth daily.   ENSURE PLUS Liqd Take 237 mLs by mouth 2 (two) times daily between meals.   feeding supplement (PRO-STAT SUGAR FREE 64) Liqd Take 30 mLs by mouth daily.   finasteride 5 MG tablet Commonly known as:  PROSCAR Take 5 mg by mouth daily.     hyoscyamine 0.375 MG 12 hr tablet Commonly known as:  LEVBID Take 0.375 mg by mouth 3 (three) times daily. cramping   mirtazapine 15 MG tablet Commonly known as:  REMERON Take 15 mg by mouth at bedtime. for depression may also improve sleep and appetite   MULTIVITAMIN ADULTS PO Take 1 tablet by mouth daily.   omeprazole 20 MG capsule Commonly known as:  PRILOSEC Take 20 mg by mouth daily.   polyethylene glycol packet Commonly known as:  MIRALAX / GLYCOLAX Take 17 g by mouth daily. Hold for diarrhea       No orders of the defined types were placed in this encounter.   Immunization History  Administered Date(s) Administered  . Influenza Split 04/28/2014  . Influenza-Unspecified 03/06/2012, 02/21/2016  . PPD Test 09/08/2015, 09/26/2015    Social History  Substance Use Topics  . Smoking status: Former Research scientist (life sciences)  . Smokeless tobacco: Never Used  . Alcohol use No    Review of Systems   UTO 2/2 dementia    Vitals:   07/14/16 0916  BP: 137/72  Pulse: 62  Resp: 19  Temp: 97.8 F (36.6 C)   Body mass index is 21.79 kg/m. Physical Exam  GENERAL APPEARANCE: Alert, modconversant, No acute distress, lying on his bed with his cap on  SKIN: No diaphoresis rash HEENT: Unremarkable RESPIRATORY: Breathing is even, unlabored. Lung sounds  are clear   CARDIOVASCULAR: Heart RRR no murmurs, rubs or gallops. No peripheral edema  GASTROINTESTINAL: Abdomen is soft, non-tender, not distended w/ normal bowel sounds.  GENITOURINARY: Bladder non tender, not distended  MUSCULOSKELETAL: No abnormal joints or musculature NEUROLOGIC: Cranial nerves 2-12 grossly intact. Moves all extremities PSYCHIATRIC: Mood and affect with dementia, no behavioral issues  Patient Active Problem List   Diagnosis Date Noted  . Anemia of chronic disease 02/10/2016  . GERD (gastroesophageal reflux disease) 01/10/2016  . Abdominal pain 10/06/2015  . Acute encephalopathy 09/29/2015  . HTN (hypertension)  09/29/2015  . Altered mental status   . Acute respiratory failure ([place]) 09/24/2015  . Malnutrition of moderate degree 09/24/2015  . Dysphagia 09/16/2015  . Arm swelling 09/16/2015  . Gout 09/16/2015  . Pressure ulcer 08/31/2015  . BPH without obstruction/lower urinary tract symptoms 08/31/2015  . Dementia without behavioral disturbance 08/31/2015  . 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 Sepsis ([place]) 08/31/2015  . Pneumonia 08/30/2015  . CAP (community acquired pneumonia) 08/30/2015    CMP     Component Value Date/Time   NA 136 (A) 02/20/2016   K 4.6 02/20/2016   CL 107 09/25/2015 0519   CO2 28 09/25/2015 0519   GLUCOSE 94 09/25/2015 0519   BUN 46 (A) 02/20/2016   CREATININE 1.1 02/20/2016   CREATININE 0.86 09/25/2015 0519   CALCIUM 10.8 (H) 09/25/2015 0519   PROT 6.3 (L) 09/25/2015 0519   ALBUMIN 3.2 (L) 09/25/2015 0519   AST 13 (A) 02/20/2016   ALT 8 (A) 02/20/2016   ALKPHOS 66 02/20/2016   BILITOT 0.6 09/25/2015 0519   GFRNONAA >60 09/25/2015 0519   GFRAA >60 09/25/2015 0519    Recent Labs  09/07/15 0516 09/23/15 2019 09/24/15 0313 09/25/15 0519  12/12/15 02/13/16 02/20/16  NA 136 139 140 142  < > 134* 135* 136*  K 3.9 3.8 3.7 4.3  < > 4.6 4.6 4.6  CL 101 104 105 107  --   --   --   --   CO2 30 26 25 28   --   --   --   --   GLUCOSE 106* 104* 141* 94  --   --   --   --   BUN 18 15 15 16   < > 15 15 46*  CREATININE 0.70 0.74 0.79 0.86  < > 0.9 0.7 1.1  CALCIUM 8.9 11.3* 10.8* 10.8*  --   --   --   --   MG 2.0  --  1.9  --   --   --   --   --   PHOS  --   --  2.9  --   --   --   --   --   < > = values in this interval not displayed.  Recent Labs  09/04/15 0529 09/24/15 0313 09/25/15 0519 12/12/15 02/20/16  AST 15 25 20 21  13*  ALT 11* 15* 12* 9* 8*  ALKPHOS 52 92 74 89 66  BILITOT 0.6 0.3 0.6  --   --   PROT 5.3* 7.0 6.3*  --   --   ALBUMIN 2.5* 3.7 3.2*  --   --     Recent Labs  08/29/15 2025 08/30/15 1533  09/23/15 2019 09/24/15 0313  09/25/15 0519  12/12/15 02/13/16 02/20/16  WBC 17.4* 20.1*  < > 9.4 14.9* 10.1  < > 6.0 7.4 10.4  NEUTROABS 15.9* 18.8*  --   --   --   --   --   --   --   --  HGB 13.1 14.3  < > 13.1 13.3 11.7*  < > 12.3* 12.8* 12.6*  HCT 40.3 42.9  < > 39.8 40.6 36.0*  < > 37* 39* 38*  MCV 88.8 89.6  < > 88.6 88.8 89.8  --   --   --   --   PLT 209 243  < > 306 294 244  < > 253 214 266  < > = values in this interval not displayed.  Recent Labs  02/13/16 02/20/16  CHOL 159 171  LDLCALC 82 98  TRIG 74 232*   No results found for: [facility] Lab Results  Component Value Date   TSH 1.43 02/13/2016   Lab Results  Component Value Date   HGBA1C 6.2 02/20/2016   Lab Results  Component Value Date   CHOL 171 02/20/2016   HDL 27 (A) 02/20/2016   LDLCALC 98 02/20/2016   TRIG 232 (A) 02/20/2016    Significant Diagnostic Results in last 30 days:  No results found.  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  [name], MD

## 2016-08-02 ENCOUNTER — Encounter: Payer: Self-pay | Admitting: Internal Medicine

## 2016-08-02 NOTE — Assessment & Plan Note (Signed)
No reported problems or infections;cont proscar 5 mg fdaily

## 2016-08-02 NOTE — Assessment & Plan Note (Signed)
Pt continues with no problems;cont pureed diet

## 2016-08-02 NOTE — Assessment & Plan Note (Signed)
Stable and chronic ;cont prilosec 20 mg daily

## 2016-08-05 ENCOUNTER — Encounter: Payer: Self-pay | Admitting: Internal Medicine

## 2016-08-05 NOTE — Progress Notes (Signed)
Location:  [facility] Room Number: 992E Place of Service:  SNF (31)  [name], MD  Patient Care Team: Kirk Ruths, MD as PCP - General (Internal Medicine)  Extended Emergency Contact Information Primary Emergency Contact: Rendleman,[name] Address: [address]          [place], [place] [address] [name] of [place] Phone: [phone] Mobile Phone: [phone] Relation: Son    Allergies: Patient has no known allergies.  Chief Complaint  Patient presents with  . Acute Visit    HPI: Patient is 83 y.o. male who is being seen acutely because an outbreak of Influenza A per CDC guidelines was recognized on 06/25/2016. Pt has no c/o flu like symptoms;therefore pt will need to be prophylaxed with Tamiflu for a minimum of 14 days per CDC protocol.  Past Medical History:  Diagnosis Date  . Cancer ([place])   . Esophageal abnormality   . Gout     Past Surgical History:  Procedure Laterality Date  . ESOPHAGOGASTRODUODENOSCOPY N/A 09/23/2015   Procedure: ESOPHAGOGASTRODUODENOSCOPY (EGD);  Surgeon: Arta Silence, MD;  [facility];  Service: Endoscopy;  Laterality: N/A;  . ESOPHAGOGASTRODUODENOSCOPY (EGD) WITH PROPOFOL N/A 08/31/2015   Procedure: ESOPHAGOGASTRODUODENOSCOPY (EGD) WITH PROPOFOL;  Surgeon: Wonda Horner, MD;  [facility];  Service: Endoscopy;  Laterality: N/A;  . JOINT REPLACEMENT     Hip    Allergies as of 06/25/2016   No Known Allergies     Medication List       Accurate as of 06/25/16 11:59 PM. Always use your most recent med list.          allopurinol 300 MG tablet Commonly known as:  ZYLOPRIM Take 300 mg by mouth daily.   aspirin EC 81 MG tablet Take 81 mg by mouth daily.   donepezil 5 MG tablet Commonly known as:  ARICEPT Take 5 mg by mouth daily.   ENSURE PLUS Liqd Take 237 mLs by mouth 2 (two) times daily between meals.   NUTRITIONAL SUPPLEMENTS PO Take 4 oz of MEDPASS by  mouth 2 times daily due to weight loss   feeding supplement (PRO-STAT SUGAR FREE 64) Liqd Take 30 mLs by mouth daily.   finasteride 5 MG tablet Commonly known as:  PROSCAR Take 5 mg by mouth daily.   hyoscyamine 0.375 MG 12 hr tablet Commonly known as:  LEVBID Take 0.375 mg by mouth 3 (three) times daily. cramping   mirtazapine 15 MG tablet Commonly known as:  REMERON Take 15 mg by mouth at bedtime. for depression may also improve sleep and appetite   MULTIVITAMIN ADULTS PO Take 1 tablet by mouth daily.   omeprazole 20 MG capsule Commonly known as:  PRILOSEC Take 20 mg by mouth daily.   polyethylene glycol packet Commonly known as:  MIRALAX / GLYCOLAX Take 17 g by mouth daily. Hold for diarrhea       No orders of the defined types were placed in this encounter.   Immunization History  Administered Date(s) Administered  . Influenza Split 04/28/2014  . Influenza-Unspecified 03/06/2012, 02/21/2016  . PPD Test 09/08/2015, 09/26/2015    Social History  Substance Use Topics  . Smoking status: Former Research scientist (life sciences)  . Smokeless tobacco: Never Used  . Alcohol use No    Review of Systems  DATA OBTAINED: from nurse GENERAL:  no fevers SKIN: No itching, rash HEENT: no rhinorrhea, congestion, ST or ear pain RESPIRATORY: No cough, wheezing, SOB CARDIAC: No chest  pain, palpitations, lower extremity edema  GI: No abdominal pain, No N/V/D or constipation, No heartburn or reflux  MUSCULOSKELETAL: No muscle aches NEUROLOGIC: No headache, dizziness   Vitals:   06/25/16 1256  BP: 137/72  Pulse: 69  Resp: 19  Temp: 97.8 F (36.6 C)   Body mass index is 21.79 kg/m. Physical Exam  GENERAL APPEARANCE: Alert, No acute distress  SKIN: No diaphoresis rash HEENT: Unremarkable RESPIRATORY: Breathing is even, unlabored. Lung sounds are clear   CARDIOVASCULAR: Heart RRR no murmurs, rubs or gallops. No peripheral edema  GASTROINTESTINAL: Abdomen is soft, non-tender, not distended  w/ normal bowel sounds.   NEUROLOGIC: Cranial nerves 2-12 grossly intact PSYCHIATRIC: baseline, no mental status changes  Patient Active Problem List   Diagnosis Date Noted  . Anemia of chronic disease 02/10/2016  . GERD (gastroesophageal reflux disease) 01/10/2016  . Abdominal pain 10/06/2015  . Acute encephalopathy 09/29/2015  . HTN (hypertension) 09/29/2015  . Altered mental status   . Acute respiratory failure ([place]) 09/24/2015  . Malnutrition of moderate degree 09/24/2015  . Dysphagia 09/16/2015  . Arm swelling 09/16/2015  . Gout 09/16/2015  . Pressure ulcer 08/31/2015  . BPH without obstruction/lower urinary tract symptoms 08/31/2015  . Dementia without behavioral disturbance 08/31/2015  . 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 Sepsis ([place]) 08/31/2015  . Pneumonia 08/30/2015  . CAP (community acquired pneumonia) 08/30/2015    CMP     Component Value Date/Time   NA 136 (A) 02/20/2016   K 4.6 02/20/2016   CL 107 09/25/2015 0519   CO2 28 09/25/2015 0519   GLUCOSE 94 09/25/2015 0519   BUN 46 (A) 02/20/2016   CREATININE 1.1 02/20/2016   CREATININE 0.86 09/25/2015 0519   CALCIUM 10.8 (H) 09/25/2015 0519   PROT 6.3 (L) 09/25/2015 0519   ALBUMIN 3.2 (L) 09/25/2015 0519   AST 13 (A) 02/20/2016   ALT 8 (A) 02/20/2016   ALKPHOS 66 02/20/2016   BILITOT 0.6 09/25/2015 0519   GFRNONAA >60 09/25/2015 0519   GFRAA >60 09/25/2015 0519    Recent Labs  09/07/15 0516 09/23/15 2019 09/24/15 0313 09/25/15 0519  12/12/15 02/13/16 02/20/16  NA 136 139 140 142  < > 134* 135* 136*  K 3.9 3.8 3.7 4.3  < > 4.6 4.6 4.6  CL 101 104 105 107  --   --   --   --   CO2 30 26 25 28   --   --   --   --   GLUCOSE 106* 104* 141* 94  --   --   --   --   BUN 18 15 15 16   < > 15 15 46*  CREATININE 0.70 0.74 0.79 0.86  < > 0.9 0.7 1.1  CALCIUM 8.9 11.3* 10.8* 10.8*  --   --   --   --   MG 2.0  --  1.9  --   --   --   --   --   PHOS  --   --  2.9  --   --   --   --   --   < > = values in this  interval not displayed.  Recent Labs  09/04/15 0529 09/24/15 0313 09/25/15 0519 12/12/15 02/20/16  AST 15 25 20 21  13*  ALT 11* 15* 12* 9* 8*  ALKPHOS 52 92 74 89 66  BILITOT 0.6 0.3 0.6  --   --   PROT 5.3* 7.0 6.3*  --   --  ALBUMIN 2.5* 3.7 3.2*  --   --     Recent Labs  08/29/15 2025 08/30/15 1533  09/23/15 2019 09/24/15 0313 09/25/15 0519  12/12/15 02/13/16 02/20/16  WBC 17.4* 20.1*  < > 9.4 14.9* 10.1  < > 6.0 7.4 10.4  NEUTROABS 15.9* 18.8*  --   --   --   --   --   --   --   --   HGB 13.1 14.3  < > 13.1 13.3 11.7*  < > 12.3* 12.8* 12.6*  HCT 40.3 42.9  < > 39.8 40.6 36.0*  < > 37* 39* 38*  MCV 88.8 89.6  < > 88.6 88.8 89.8  --   --   --   --   PLT 209 243  < > 306 294 244  < > 253 214 266  < > = values in this interval not displayed.  Recent Labs  02/13/16 02/20/16  CHOL 159 171  LDLCALC 82 98  TRIG 74 232*   No results found for: [facility] Lab Results  Component Value Date   TSH 1.43 02/13/2016   Lab Results  Component Value Date   HGBA1C 6.2 02/20/2016   Lab Results  Component Value Date   CHOL 171 02/20/2016   HDL 27 (A) 02/20/2016   LDLCALC 98 02/20/2016   TRIG 232 (A) 02/20/2016    Significant Diagnostic Results in last 30 days:  No results found.  Assessment and Plan  EXPOSURE TO FLU/ INFLUENZA OUTBREAK AT SNF-   CrCl calculated by me-   46   Dose for 14 days- 30 mg daily Pt will be monitored daily for flu like symptoms                                                                                 Webb Silversmith D. [name], MD

## 2016-08-11 ENCOUNTER — Encounter: Payer: Self-pay | Admitting: Internal Medicine

## 2016-08-11 ENCOUNTER — Non-Acute Institutional Stay (SKILLED_NURSING_FACILITY): Payer: Medicare Other | Admitting: Internal Medicine

## 2016-08-11 DIAGNOSIS — I739 Peripheral vascular disease, unspecified: Secondary | ICD-10-CM | POA: Diagnosis not present

## 2016-08-11 DIAGNOSIS — F0390 Unspecified dementia without behavioral disturbance: Secondary | ICD-10-CM | POA: Diagnosis not present

## 2016-08-11 DIAGNOSIS — M1000 Idiopathic gout, unspecified site: Secondary | ICD-10-CM | POA: Diagnosis not present

## 2016-08-11 NOTE — Progress Notes (Signed)
Location:  [facility] Room Number: 540G Place of Service:  SNF (31)  Jack Cantrell. [name], MD  Patient Care Team: [name], MD as PCP - General (Internal Medicine)  Extended Emergency Contact Information Primary Emergency Contact: [name],[name] Address: [address]          [place], [place] [address] [name] of [place] Phone: [phone] Mobile Phone: [phone] Relation: Son    Allergies: Patient has no known allergies.  Chief Complaint  Patient presents with  . Medical Management of Chronic Issues    Routine Visit    HPI: Patient is 83 y.o. male who is being seen for routine issues of dementia, gout and PVD.  Past Medical History:  Diagnosis Date  . Actinic keratosis   . Arthritis   . BPH (benign prostatic hyperplasia)   . Cancer ([place])   . Cataract cortical, senile   . Esophageal abnormality   . Gout   . Hyperlipidemia, unspecified   . Hypertension   . PVD (peripheral vascular disease) ([place] [place])     Past Surgical History:  Procedure Laterality Date  . ESOPHAGOGASTRODUODENOSCOPY N/A 09/23/2015   Procedure: ESOPHAGOGASTRODUODENOSCOPY (EGD);  Surgeon: Arta Silence, MD;  [facility];  Service: Endoscopy;  Laterality: N/A;  . ESOPHAGOGASTRODUODENOSCOPY     01/15/2009 , 10/29/2009  . ESOPHAGOGASTRODUODENOSCOPY (EGD) WITH PROPOFOL N/A 08/31/2015   Procedure: ESOPHAGOGASTRODUODENOSCOPY (EGD) WITH PROPOFOL;  Surgeon: Wonda Horner, MD;  [facility];  Service: Endoscopy;  Laterality: N/A;  . FOOT SURGERY    . JOINT REPLACEMENT     Hip  . TONSILLECTOMY      Allergies as of 08/11/2016   No Known Allergies     Medication List       Accurate as of 08/11/16 11:59 PM. Always use your most recent med list.          allopurinol 300 MG tablet Commonly known as:  ZYLOPRIM Take 300 mg by mouth daily.   aspirin EC 81 MG tablet Take 81 mg by mouth daily.   donepezil 5 MG tablet Commonly known as:   ARICEPT Take 5 mg by mouth daily.   ENSURE PLUS Liqd Take 237 mLs by mouth 2 (two) times daily between meals.   NUTRITIONAL SUPPLEMENTS PO Take 4 oz of MEDPASS by mouth 2 times daily due to weight loss   feeding supplement (PRO-STAT SUGAR FREE 64) Liqd Take 30 mLs by mouth daily.   finasteride 5 MG tablet Commonly known as:  PROSCAR Take 5 mg by mouth daily.   hyoscyamine 0.375 MG 12 hr tablet Commonly known as:  LEVBID Take 0.375 mg by mouth 3 (three) times daily. cramping   mirtazapine 15 MG tablet Commonly known as:  REMERON Take 15 mg by mouth at bedtime. for depression may also improve sleep and appetite   MULTIVITAMIN ADULTS PO Take 1 tablet by mouth daily.   omeprazole 20 MG capsule Commonly known as:  PRILOSEC Take 20 mg by mouth daily.   polyethylene glycol packet Commonly known as:  MIRALAX / GLYCOLAX Take 17 g by mouth daily. Hold for diarrhea       No orders of the defined types were placed in this encounter.   Immunization History  Administered Date(s) Administered  . Influenza Split 04/28/2014  . Influenza-Unspecified 03/06/2012, 02/21/2016  . PPD Test 09/08/2015, 09/26/2015    Social History  Substance Use Topics  . Smoking status: Former Research scientist (life sciences)  . Smokeless tobacco: Never Used  .  Alcohol use No    Review of Systems  UTO 2/2 dementia; nursing without issues    Vitals:   08/11/16 1008  BP: 137/72  Pulse: 68  Resp: (!) 22  Temp: 97.8 F (36.6 C)   Body mass index is 21.65 kg/m. Physical Exam  GENERAL APPEARANCE: Alert, min conversant, No acute distress  SKIN: No diaphoresis rash HEENT: Unremarkable RESPIRATORY: Breathing is even, unlabored. Lung sounds are clear   CARDIOVASCULAR: Heart RRR no murmurs, rubs or gallops. No peripheral edema  GASTROINTESTINAL: Abdomen is soft, non-tender, not distended w/ normal bowel sounds.  GENITOURINARY: Bladder non tender, not distended  MUSCULOSKELETAL: No abnormal joints or  musculature NEUROLOGIC: Cranial nerves 2-12 grossly intact. Moves all extremities PSYCHIATRIC: Mood and affect with dementia, no behavioral issues  Patient Active Problem List   Diagnosis Date Noted  . BPH (benign prostatic hyperplasia)   . Arthritis   . Hyperlipidemia, unspecified   . Cataract cortical, senile   . PVD (peripheral vascular disease) ([place])   . Actinic keratosis   . Hypertension   . Anemia of chronic disease 02/10/2016  . GERD (gastroesophageal reflux disease) 01/10/2016  . Abdominal pain 10/06/2015  . Acute encephalopathy 09/29/2015  . HTN (hypertension) 09/29/2015  . Altered mental status   . Acute respiratory failure ([place]) 09/24/2015  . Malnutrition of moderate degree 09/24/2015  . Dysphagia 09/16/2015  . Arm swelling 09/16/2015  . Gout 09/16/2015  . Pressure ulcer 08/31/2015  . BPH without obstruction/lower urinary tract symptoms 08/31/2015  . Dementia without behavioral disturbance 08/31/2015  . 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 Sepsis ([place]) 08/31/2015  . Pneumonia 08/30/2015  . CAP (community acquired pneumonia) 08/30/2015    CMP     Component Value Date/Time   NA 136 (A) 02/20/2016   K 4.6 02/20/2016   CL 107 09/25/2015 0519   CO2 28 09/25/2015 0519   GLUCOSE 94 09/25/2015 0519   BUN 46 (A) 02/20/2016   CREATININE 1.1 02/20/2016   CREATININE 0.86 09/25/2015 0519   CALCIUM 10.8 (H) 09/25/2015 0519   PROT 6.3 (L) 09/25/2015 0519   ALBUMIN 3.2 (L) 09/25/2015 0519   AST 13 (A) 02/20/2016   ALT 8 (A) 02/20/2016   ALKPHOS 66 02/20/2016   BILITOT 0.6 09/25/2015 0519   GFRNONAA >60 09/25/2015 0519   GFRAA >60 09/25/2015 0519    Recent Labs  09/23/15 2019 09/24/15 0313 09/25/15 0519  12/12/15 02/13/16 02/20/16  NA 139 140 142  < > 134* 135* 136*  K 3.8 3.7 4.3  < > 4.6 4.6 4.6  CL 104 105 107  --   --   --   --   CO2 26 25 28   --   --   --   --   GLUCOSE 104* 141* 94  --   --   --   --   BUN 15 15 16   < > 15 15 46*  CREATININE 0.74 0.79  0.86  < > 0.9 0.7 1.1  CALCIUM 11.3* 10.8* 10.8*  --   --   --   --   MG  --  1.9  --   --   --   --   --   PHOS  --  2.9  --   --   --   --   --   < > = values in this interval not displayed.  Recent Labs  09/24/15 0313 09/25/15 0519 12/12/15 02/20/16  AST 25 20 21  13*  ALT 15* 12*  9* 8*  ALKPHOS 92 74 89 66  BILITOT 0.3 0.6  --   --   PROT 7.0 6.3*  --   --   ALBUMIN 3.7 3.2*  --   --     Recent Labs  09/23/15 2019 09/24/15 0313 09/25/15 0519  12/12/15 02/13/16 02/20/16  WBC 9.4 14.9* 10.1  < > 6.0 7.4 10.4  HGB 13.1 13.3 11.7*  < > 12.3* 12.8* 12.6*  HCT 39.8 40.6 36.0*  < > 37* 39* 38*  MCV 88.6 88.8 89.8  --   --   --   --   PLT 306 294 244  < > 253 214 266  < > = values in this interval not displayed.  Recent Labs  02/13/16 02/20/16  CHOL 159 171  LDLCALC 82 98  TRIG 74 232*   No results found for: [facility] Lab Results  Component Value Date   TSH 1.43 02/13/2016   Lab Results  Component Value Date   HGBA1C 6.2 02/20/2016   Lab Results  Component Value Date   CHOL 171 02/20/2016   HDL 27 (A) 02/20/2016   LDLCALC 98 02/20/2016   TRIG 232 (A) 02/20/2016    Significant Diagnostic Results in last 30 days:  No results found.  Assessment and Plan  Dementia without behavioral disturbance Stable with out declines despite age! Cont aricept 5 mg qHS  Gout Continues with no known flare; plan to cont allopurinol 300 mg daily  PVD (peripheral vascular disease) ([facility]) No known lesions; plan to cont 81 mg dsaily    Jack Cantrell. [name], MD

## 2016-08-13 DIAGNOSIS — M1990 Unspecified osteoarthritis, unspecified site: Secondary | ICD-10-CM | POA: Insufficient documentation

## 2016-08-13 DIAGNOSIS — E785 Hyperlipidemia, unspecified: Secondary | ICD-10-CM | POA: Insufficient documentation

## 2016-08-13 DIAGNOSIS — H25019 Cortical age-related cataract, unspecified eye: Secondary | ICD-10-CM | POA: Insufficient documentation

## 2016-08-13 DIAGNOSIS — I739 Peripheral vascular disease, unspecified: Secondary | ICD-10-CM | POA: Insufficient documentation

## 2016-08-13 DIAGNOSIS — N400 Enlarged prostate without lower urinary tract symptoms: Secondary | ICD-10-CM | POA: Insufficient documentation

## 2016-08-13 DIAGNOSIS — L570 Actinic keratosis: Secondary | ICD-10-CM | POA: Insufficient documentation

## 2016-08-13 DIAGNOSIS — I10 Essential (primary) hypertension: Secondary | ICD-10-CM | POA: Insufficient documentation

## 2016-08-14 ENCOUNTER — Encounter: Payer: Self-pay | Admitting: Internal Medicine

## 2016-09-11 ENCOUNTER — Non-Acute Institutional Stay (SKILLED_NURSING_FACILITY): Payer: Medicare Other | Admitting: Internal Medicine

## 2016-09-11 ENCOUNTER — Encounter: Payer: Self-pay | Admitting: Internal Medicine

## 2016-09-11 DIAGNOSIS — K219 Gastro-esophageal reflux disease without esophagitis: Secondary | ICD-10-CM | POA: Diagnosis not present

## 2016-09-11 DIAGNOSIS — I10 Essential (primary) hypertension: Secondary | ICD-10-CM | POA: Diagnosis not present

## 2016-09-11 DIAGNOSIS — R1319 Other dysphagia: Secondary | ICD-10-CM

## 2016-09-11 NOTE — Progress Notes (Signed)
Location:  [facility] Room Number: 660Y Place of Service:  SNF (31)  [name], MD  Patient Care Team: [name], MD as PCP - General (Internal Medicine)  Extended Emergency Contact Information Primary Emergency Contact: Imhoff,[name] Address: [address]          [place], [place] [address] [name] of [place] Phone: [phone] Mobile Phone: [phone] Relation: Son    Allergies: Patient has no known allergies.  Chief Complaint  Patient presents with  . Medical Management of Chronic Issues    Routine Visit    HPI: Patient is 83 y.o. male who is being seen for routine issues of HTN, dysphagia and GERD.  Past Medical History:  Diagnosis Date  . Actinic keratosis   . Arthritis   . BPH (benign prostatic hyperplasia)   . Cancer ([place])   . Cataract cortical, senile   . Esophageal abnormality   . Gout   . Hyperlipidemia, unspecified   . Hypertension   . PVD (peripheral vascular disease) ([place])     Past Surgical History:  Procedure Laterality Date  . ESOPHAGOGASTRODUODENOSCOPY N/A 09/23/2015   Procedure: ESOPHAGOGASTRODUODENOSCOPY (EGD);  Surgeon: Arta Silence, MD;  [facility];  Service: Endoscopy;  Laterality: N/A;  . ESOPHAGOGASTRODUODENOSCOPY     01/15/2009 , 10/29/2009  . ESOPHAGOGASTRODUODENOSCOPY (EGD) WITH PROPOFOL N/A 08/31/2015   Procedure: ESOPHAGOGASTRODUODENOSCOPY (EGD) WITH PROPOFOL;  Surgeon: Wonda Horner, MD;  [facility];  Service: Endoscopy;  Laterality: N/A;  . FOOT SURGERY    . JOINT REPLACEMENT     Hip  . TONSILLECTOMY      Allergies as of 09/11/2016   No Known Allergies     Medication List       Accurate as of 09/11/16 11:59 PM. Always use your most recent med list.          allopurinol 300 MG tablet Commonly known as:  ZYLOPRIM Take 300 mg by mouth daily.   aspirin EC 81 MG tablet Take 81 mg by mouth daily.   donepezil 5 MG tablet Commonly known as:   ARICEPT Take 5 mg by mouth daily.   ENSURE PLUS Liqd Take 237 mLs by mouth 2 (two) times daily between meals.   NUTRITIONAL SUPPLEMENTS PO Take 4 oz of MEDPASS by mouth 2 times daily due to weight loss   feeding supplement (PRO-STAT SUGAR FREE 64) Liqd Take 30 mLs by mouth daily.   finasteride 5 MG tablet Commonly known as:  PROSCAR Take 5 mg by mouth daily.   hyoscyamine 0.375 MG 12 hr tablet Commonly known as:  LEVBID Take 0.375 mg by mouth 3 (three) times daily. cramping   mirtazapine 7.5 MG tablet Commonly known as:  REMERON Take 7.5 mg by mouth at bedtime.   MULTIVITAMIN ADULTS PO Take 1 tablet by mouth daily.   omeprazole 20 MG capsule Commonly known as:  PRILOSEC Take 20 mg by mouth daily.   polyethylene glycol packet Commonly known as:  MIRALAX / GLYCOLAX Take 17 g by mouth daily. Hold for diarrhea       No orders of the defined types were placed in this encounter.   Immunization History  Administered Date(s) Administered  . Influenza Split 04/28/2014  . Influenza-Unspecified 03/06/2012, 02/21/2016  . PPD Test 09/08/2015, 09/26/2015    Social History  Substance Use Topics  . Smoking status: Former Research scientist (life sciences)  . Smokeless tobacco: Never Used  . Alcohol use No    Review of  Systems  UTO 2/2 dementia    Vitals:   09/11/16 1030  BP: 137/72  Pulse: 71  Resp: 19  Temp: 97.6 F (36.4 C)   Body mass index is 21.65 kg/m. Physical Exam  GENERAL APPEARANCE: Alert, non conversant, No acute distress  SKIN: No diaphoresis rash HEENT: Unremarkable RESPIRATORY: Breathing is even, unlabored. Lung sounds are clear   CARDIOVASCULAR: Heart RRR no murmurs, rubs or gallops. No peripheral edema  GASTROINTESTINAL: Abdomen is soft, non-tender, not distended w/ normal bowel sounds.  GENITOURINARY: Bladder non tender, not distended  MUSCULOSKELETAL: No abnormal joints or musculature NEUROLOGIC: Cranial nerves 2-12 grossly intact. Moves all  extremities PSYCHIATRIC: dementia, no behavioral issues  Patient Active Problem List   Diagnosis Date Noted  . BPH (benign prostatic hyperplasia)   . Arthritis   . Hyperlipidemia, unspecified   . Cataract cortical, senile   . PVD (peripheral vascular disease) ([place])   . Actinic keratosis   . Hypertension   . Anemia of chronic disease 02/10/2016  . GERD (gastroesophageal reflux disease) 01/10/2016  . Abdominal pain 10/06/2015  . Acute encephalopathy 09/29/2015  . HTN (hypertension) 09/29/2015  . Altered mental status   . Acute respiratory failure ([place]) 09/24/2015  . Malnutrition of moderate degree 09/24/2015  . Dysphagia 09/16/2015  . Arm swelling 09/16/2015  . Gout 09/16/2015  . Pressure ulcer 08/31/2015  . BPH without obstruction/lower urinary tract symptoms 08/31/2015  . Dementia without behavioral disturbance 08/31/2015  . 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 Sepsis ([place]) 08/31/2015  . Pneumonia 08/30/2015  . CAP (community acquired pneumonia) 08/30/2015    CMP     Component Value Date/Time   NA 136 (A) 02/20/2016   K 4.6 02/20/2016   CL 107 09/25/2015 0519   CO2 28 09/25/2015 0519   GLUCOSE 94 09/25/2015 0519   BUN 46 (A) 02/20/2016   CREATININE 1.1 02/20/2016   CREATININE 0.86 09/25/2015 0519   CALCIUM 10.8 (H) 09/25/2015 0519   PROT 6.3 (L) 09/25/2015 0519   ALBUMIN 3.2 (L) 09/25/2015 0519   AST 13 (A) 02/20/2016   ALT 8 (A) 02/20/2016   ALKPHOS 66 02/20/2016   BILITOT 0.6 09/25/2015 0519   GFRNONAA >60 09/25/2015 0519   GFRAA >60 09/25/2015 0519    Recent Labs  12/12/15 02/13/16 02/20/16  NA 134* 135* 136*  K 4.6 4.6 4.6  BUN 15 15 46*  CREATININE 0.9 0.7 1.1    Recent Labs  12/12/15 02/20/16  AST 21 13*  ALT 9* 8*  ALKPHOS 89 66    Recent Labs  12/12/15 02/13/16 02/20/16  WBC 6.0 7.4 10.4  HGB 12.3* 12.8* 12.6*  HCT 37* 39* 38*  PLT 253 214 266    Recent Labs  02/13/16 02/20/16  CHOL 159 171  LDLCALC 82 98  TRIG 74 232*   No  results found for: [facility] Lab Results  Component Value Date   TSH 1.43 02/13/2016   Lab Results  Component Value Date   HGBA1C 6.2 02/20/2016   Lab Results  Component Value Date   CHOL 171 02/20/2016   HDL 27 (A) 02/20/2016   LDLCALC 98 02/20/2016   TRIG 232 (A) 02/20/2016    Significant Diagnostic Results in last 30 days:  No results found.  Assessment and Plan  HTN (hypertension) Controlled on no meds; cont to monitor monthly  Dysphagia Pt tolerating pureed diet well; plan to cont Levbid 0.375 mg BID  GERD (gastroesophageal reflux disease) No reports of reflux;plan to cont omeprazole 20 mg  daily    [name], MD

## 2016-09-14 ENCOUNTER — Encounter: Payer: Self-pay | Admitting: Internal Medicine

## 2016-09-14 NOTE — Assessment & Plan Note (Signed)
No known lesions; plan to cont 81 mg dsaily

## 2016-09-14 NOTE — Assessment & Plan Note (Signed)
Stable with out declines despite age! Cont aricept 5 mg qHS

## 2016-09-14 NOTE — Assessment & Plan Note (Signed)
Continues with no known flare; plan to cont allopurinol 300 mg daily

## 2016-10-09 ENCOUNTER — Non-Acute Institutional Stay (SKILLED_NURSING_FACILITY): Payer: Medicare Other | Admitting: Internal Medicine

## 2016-10-09 ENCOUNTER — Encounter: Payer: Self-pay | Admitting: Internal Medicine

## 2016-10-09 DIAGNOSIS — I10 Essential (primary) hypertension: Secondary | ICD-10-CM

## 2016-10-09 DIAGNOSIS — N400 Enlarged prostate without lower urinary tract symptoms: Secondary | ICD-10-CM

## 2016-10-09 DIAGNOSIS — K224 Dyskinesia of esophagus: Secondary | ICD-10-CM | POA: Diagnosis not present

## 2016-10-09 NOTE — Progress Notes (Signed)
Location:  [place] Room Number: 440H Place of Service:  SNF (31)  [name], MD  Patient Care Team: [name], MD as PCP - General (Internal Medicine)  Extended Emergency Contact Information Primary Emergency Contact: Garbett,[name] Address: [address]          [place], [place] [address] [name] of [place] Phone: [phone] Mobile Phone: [phone] Relation: Son    Allergies: Patient has no known allergies.  Chief Complaint  Patient presents with  . Medical Management of Chronic Issues    Routine Visit    HPI: Patient is 83 y.o. male who is being seen for routine issues of HTN, nutcracker esophagus and BPH.  Past Medical History:  Diagnosis Date  . Actinic keratosis   . Arthritis   . BPH (benign prostatic hyperplasia)   . Cancer ([place])   . Cataract cortical, senile   . Esophageal abnormality   . Gout   . Hyperlipidemia, unspecified   . Hypertension   . PVD (peripheral vascular disease) ([place])     Past Surgical History:  Procedure Laterality Date  . ESOPHAGOGASTRODUODENOSCOPY N/A 09/23/2015   Procedure: ESOPHAGOGASTRODUODENOSCOPY (EGD);  Surgeon: Arta Silence, MD;  [facility];  Service: Endoscopy;  Laterality: N/A;  . ESOPHAGOGASTRODUODENOSCOPY     01/15/2009 , 10/29/2009  . ESOPHAGOGASTRODUODENOSCOPY (EGD) WITH PROPOFOL N/A 08/31/2015   Procedure: ESOPHAGOGASTRODUODENOSCOPY (EGD) WITH PROPOFOL;  Surgeon: Wonda Horner, MD;  [facility];  Service: Endoscopy;  Laterality: N/A;  . FOOT SURGERY    . JOINT REPLACEMENT     Hip  . TONSILLECTOMY      Allergies as of 10/09/2016   No Known Allergies     Medication List       Accurate as of 10/09/16 11:59 PM. Always use your most recent med list.          allopurinol 300 MG tablet Commonly known as:  ZYLOPRIM Take 300 mg by mouth daily.   aspirin EC 81 MG tablet Take 81 mg by mouth daily.   donepezil 5 MG tablet Commonly  known as:  ARICEPT Take 5 mg by mouth daily.   ENSURE PLUS Liqd Take 237 mLs by mouth 2 (two) times daily between meals.   NUTRITIONAL SUPPLEMENTS PO Take 4 oz of MEDPASS by mouth 2 times daily due to weight loss   feeding supplement (PRO-STAT SUGAR FREE 64) Liqd Take 30 mLs by mouth daily.   finasteride 5 MG tablet Commonly known as:  PROSCAR Take 5 mg by mouth daily.   hyoscyamine 0.375 MG 12 hr tablet Commonly known as:  LEVBID Take 0.375 mg by mouth 3 (three) times daily. cramping   mirtazapine 7.5 MG tablet Commonly known as:  REMERON Take 7.5 mg by mouth at bedtime.   MULTIVITAMIN ADULTS PO Take 1 tablet by mouth daily.   omeprazole 20 MG capsule Commonly known as:  PRILOSEC Take 20 mg by mouth daily.   polyethylene glycol packet Commonly known as:  MIRALAX / GLYCOLAX Take 17 g by mouth daily. Hold for diarrhea       No orders of the defined types were placed in this encounter.   Immunization History  Administered Date(s) Administered  . Influenza Split 04/28/2014  . Influenza-Unspecified 03/06/2012, 02/21/2016  . PPD Test 09/08/2015, 09/26/2015    Social History  Substance Use Topics  . Smoking status: Former Research scientist (life sciences)  . Smokeless tobacco: Never Used  . Alcohol use No    Review  of Systems  UTO 2/2 dementia    Vitals:   10/09/16 1412  BP: 137/72  Pulse: 70  Resp: 18  Temp: 97.6 F (36.4 C)   Body mass index is 21.29 kg/m. Physical Exam  GENERAL APPEARANCE: Alert, min conversant, No acute distress  SKIN: No diaphoresis rash HEENT: Unremarkable RESPIRATORY: Breathing is even, unlabored. Lung sounds are clear   CARDIOVASCULAR: Heart RRR no murmurs, rubs or gallops. No peripheral edema  GASTROINTESTINAL: Abdomen is soft, non-tender, not distended w/ normal bowel sounds.  GENITOURINARY: Bladder non tender, not distended  MUSCULOSKELETAL: No abnormal joints or musculature NEUROLOGIC: Cranial nerves 2-12 grossly intact. Moves all  extremities PSYCHIATRIC: Mood and affect with dementia, no behavioral issues  Patient Active Problem List   Diagnosis Date Noted  . BPH (benign prostatic hyperplasia)   . Arthritis   . Hyperlipidemia, unspecified   . Cataract cortical, senile   . PVD (peripheral vascular disease) ([place])   . Actinic keratosis   . Hypertension   . Anemia of chronic disease 02/10/2016  . GERD (gastroesophageal reflux disease) 01/10/2016  . Abdominal pain 10/06/2015  . Acute encephalopathy 09/29/2015  . HTN (hypertension) 09/29/2015  . Altered mental status   . Acute respiratory failure ([place]) 09/24/2015  . Malnutrition of moderate degree 09/24/2015  . Dysphagia 09/16/2015  . Arm swelling 09/16/2015  . Gout 09/16/2015  . Pressure ulcer 08/31/2015  . BPH without obstruction/lower urinary tract symptoms 08/31/2015  . Dementia without behavioral disturbance 08/31/2015  . 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 Sepsis ([place]) 08/31/2015  . Pneumonia 08/30/2015  . CAP (community acquired pneumonia) 08/30/2015    CMP     Component Value Date/Time   NA 136 (A) 02/20/2016   K 4.6 02/20/2016   CL 107 09/25/2015 0519   CO2 28 09/25/2015 0519   GLUCOSE 94 09/25/2015 0519   BUN 46 (A) 02/20/2016   CREATININE 1.1 02/20/2016   CREATININE 0.86 09/25/2015 0519   CALCIUM 10.8 (H) 09/25/2015 0519   PROT 6.3 (L) 09/25/2015 0519   ALBUMIN 3.2 (L) 09/25/2015 0519   AST 13 (A) 02/20/2016   ALT 8 (A) 02/20/2016   ALKPHOS 66 02/20/2016   BILITOT 0.6 09/25/2015 0519   GFRNONAA >60 09/25/2015 0519   GFRAA >60 09/25/2015 0519    Recent Labs  12/12/15 02/13/16 02/20/16  NA 134* 135* 136*  K 4.6 4.6 4.6  BUN 15 15 46*  CREATININE 0.9 0.7 1.1    Recent Labs  12/12/15 02/20/16  AST 21 13*  ALT 9* 8*  ALKPHOS 89 66    Recent Labs  12/12/15 02/13/16 02/20/16  WBC 6.0 7.4 10.4  HGB 12.3* 12.8* 12.6*  HCT 37* 39* 38*  PLT 253 214 266    Recent Labs  02/13/16 02/20/16  CHOL 159 171  LDLCALC 82 98   TRIG 74 232*   No results found for: MICROALBUR Lab Results  Component Value Date   TSH 1.43 02/13/2016   Lab Results  Component Value Date   HGBA1C 6.2 02/20/2016   Lab Results  Component Value Date   CHOL 171 02/20/2016   HDL 27 (A) 02/20/2016   LDLCALC 98 02/20/2016   TRIG 232 (A) 02/20/2016    Significant Diagnostic Results in last 30 days:  No results found.  Assessment and Plan  Hypertension Well controlled on no meds; will cont to monitor  Nutcracker esophagus No reported problems or infections; cont to monitor  BPH without obstruction/lower urinary tract symptoms No reported infections or prolems;plan  to cont proscar 5 mg daily     [name], MD

## 2016-10-09 NOTE — Progress Notes (Signed)
Opened in error . disregard

## 2016-10-12 ENCOUNTER — Encounter: Payer: Self-pay | Admitting: Internal Medicine

## 2016-10-12 NOTE — Assessment & Plan Note (Signed)
Pt tolerating pureed diet well; plan to cont Levbid 0.375 mg BID

## 2016-10-12 NOTE — Assessment & Plan Note (Signed)
No reports of reflux;plan to cont omeprazole 20 mg daily

## 2016-10-12 NOTE — Assessment & Plan Note (Signed)
Controlled on no meds; cont to monitor monthly

## 2016-11-09 ENCOUNTER — Encounter: Payer: Self-pay | Admitting: Internal Medicine

## 2016-11-09 NOTE — Assessment & Plan Note (Signed)
No reported problems or infections; cont to monitor

## 2016-11-09 NOTE — Assessment & Plan Note (Signed)
Well controlled on no meds; will cont to monitor

## 2016-11-09 NOTE — Assessment & Plan Note (Signed)
No reported infections or prolems;plan to cont proscar 5 mg daily

## 2016-11-11 ENCOUNTER — Non-Acute Institutional Stay (SKILLED_NURSING_FACILITY): Payer: Medicare Other | Admitting: Internal Medicine

## 2016-11-11 DIAGNOSIS — R635 Abnormal weight gain: Secondary | ICD-10-CM | POA: Diagnosis not present

## 2016-11-12 ENCOUNTER — Non-Acute Institutional Stay (SKILLED_NURSING_FACILITY): Payer: Medicare Other | Admitting: Internal Medicine

## 2016-11-12 ENCOUNTER — Encounter: Payer: Self-pay | Admitting: Internal Medicine

## 2016-11-12 DIAGNOSIS — I739 Peripheral vascular disease, unspecified: Secondary | ICD-10-CM

## 2016-11-12 DIAGNOSIS — R1319 Other dysphagia: Secondary | ICD-10-CM | POA: Diagnosis not present

## 2016-11-12 DIAGNOSIS — K219 Gastro-esophageal reflux disease without esophagitis: Secondary | ICD-10-CM

## 2016-11-12 NOTE — Progress Notes (Signed)
Location:  [facility] Room Number: 791T Place of Service:  SNF (31)  [name], MD  Patient Care Team: [name], MD as PCP - General (Internal Medicine)  Extended Emergency Contact Information Primary Emergency Contact: Demmer,[name] Address: [address]          [place], [place] [address] [name] of [place] Phone: [phone] Mobile Phone: [phone] Relation: Son    Allergies: Patient has no known allergies.  Chief Complaint  Patient presents with  . Medical Management of Chronic Issues    Routine Visit    HPI: Patient is 83 y.o. male who Is being seen for routine issues of PVD, GERD, and dysphasia.  Past Medical History:  Diagnosis Date  . Actinic keratosis   . Arthritis   . BPH (benign prostatic hyperplasia)   . Cancer ([place])   . Cataract cortical, senile   . Esophageal abnormality   . Gout   . Hyperlipidemia, unspecified   . Hypertension   . PVD (peripheral vascular disease) ([place])     Past Surgical History:  Procedure Laterality Date  . ESOPHAGOGASTRODUODENOSCOPY N/A 09/23/2015   Procedure: ESOPHAGOGASTRODUODENOSCOPY (EGD);  Surgeon: Arta Silence, MD;  [facility];  Service: Endoscopy;  Laterality: N/A;  . ESOPHAGOGASTRODUODENOSCOPY     01/15/2009 , 10/29/2009  . ESOPHAGOGASTRODUODENOSCOPY (EGD) WITH PROPOFOL N/A 08/31/2015   Procedure: ESOPHAGOGASTRODUODENOSCOPY (EGD) WITH PROPOFOL;  Surgeon: Wonda Horner, MD;  [facility];  Service: Endoscopy;  Laterality: N/A;  . FOOT SURGERY    . JOINT REPLACEMENT     Hip  . TONSILLECTOMY      Allergies as of 11/12/2016   No Known Allergies     Medication List       Accurate as of 11/12/16 11:59 PM. Always use your most recent med list.          allopurinol 300 MG tablet Commonly known as:  ZYLOPRIM Take 300 mg by mouth daily.   aspirin EC 81 MG tablet Take 81 mg by mouth daily.   donepezil 5 MG tablet Commonly known as:   ARICEPT Take 5 mg by mouth daily.   ENSURE PLUS Liqd Take 237 mLs by mouth 2 (two) times daily between meals.   NUTRITIONAL SUPPLEMENTS PO Take 4 oz of MEDPASS by mouth 2 times daily due to weight loss   finasteride 5 MG tablet Commonly known as:  PROSCAR Take 5 mg by mouth daily.   hyoscyamine 0.375 MG 12 hr tablet Commonly known as:  LEVBID Take 0.375 mg by mouth 3 (three) times daily. cramping   mirtazapine 7.5 MG tablet Commonly known as:  REMERON Take 7.5 mg by mouth at bedtime.   MULTIVITAMIN ADULTS PO Take 1 tablet by mouth daily.   omeprazole 20 MG capsule Commonly known as:  PRILOSEC Take 20 mg by mouth daily.   polyethylene glycol packet Commonly known as:  MIRALAX / GLYCOLAX Take 17 g by mouth daily. Hold for diarrhea       No orders of the defined types were placed in this encounter.   Immunization History  Administered Date(s) Administered  . Influenza Split 04/28/2014  . Influenza-Unspecified 03/06/2012, 02/21/2016  . PPD Test 09/08/2015, 09/26/2015    Social History  Substance Use Topics  . Smoking status: Former Research scientist (life sciences)  . Smokeless tobacco: Never Used  . Alcohol use No    Review of Systems  UTO 2/2 dementia; nursing without concerns    Vitals:   11/12/16  1054  BP: 137/72  Pulse: 70  Resp: 19  Temp: 97.6 F (36.4 C)   Body mass index is 23.49 kg/m. Physical Exam  GENERAL APPEARANCE: Alert, min conversant, No acute distress , always wearing his black cap SKIN: No diaphoresis rash HEENT: Unremarkable RESPIRATORY: Breathing is even, unlabored. Lung sounds are clear   CARDIOVASCULAR: Heart RRR no murmurs, rubs or gallops. No peripheral edema  GASTROINTESTINAL: Abdomen is soft, non-tender, not distended w/ normal bowel sounds.  GENITOURINARY: Bladder non tender, not distended  MUSCULOSKELETAL: No abnormal joints or musculature NEUROLOGIC: Cranial nerves 2-12 grossly intact. Moves all extremities PSYCHIATRIC: Mood and affect with  dementia, no behavioral issues  Patient Active Problem List   Diagnosis Date Noted  . BPH (benign prostatic hyperplasia)   . Arthritis   . Hyperlipidemia, unspecified   . Cataract cortical, senile   . PVD (peripheral vascular disease) ([place])   . Actinic keratosis   . Hypertension   . Anemia of chronic disease 02/10/2016  . GERD (gastroesophageal reflux disease) 01/10/2016  . Abdominal pain 10/06/2015  . Acute encephalopathy 09/29/2015  . HTN (hypertension) 09/29/2015  . Altered mental status   . Acute respiratory failure ([place]) 09/24/2015  . Malnutrition of moderate degree 09/24/2015  . Dysphagia 09/16/2015  . Arm swelling 09/16/2015  . Gout 09/16/2015  . Pressure ulcer 08/31/2015  . BPH without obstruction/lower urinary tract symptoms 08/31/2015  . Dementia without behavioral disturbance 08/31/2015  . 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 Sepsis ([place]) 08/31/2015  . Pneumonia 08/30/2015  . CAP (community acquired pneumonia) 08/30/2015    CMP     Component Value Date/Time   NA 136 (A) 02/20/2016   K 4.6 02/20/2016   CL 107 09/25/2015 0519   CO2 28 09/25/2015 0519   GLUCOSE 94 09/25/2015 0519   BUN 46 (A) 02/20/2016   CREATININE 1.1 02/20/2016   CREATININE 0.86 09/25/2015 0519   CALCIUM 10.8 (H) 09/25/2015 0519   PROT 6.3 (L) 09/25/2015 0519   ALBUMIN 3.2 (L) 09/25/2015 0519   AST 13 (A) 02/20/2016   ALT 8 (A) 02/20/2016   ALKPHOS 66 02/20/2016   BILITOT 0.6 09/25/2015 0519   GFRNONAA >60 09/25/2015 0519   GFRAA >60 09/25/2015 0519    Recent Labs  12/12/15 02/13/16 02/20/16  NA 134* 135* 136*  K 4.6 4.6 4.6  BUN 15 15 46*  CREATININE 0.9 0.7 1.1    Recent Labs  12/12/15 02/20/16  AST 21 13*  ALT 9* 8*  ALKPHOS 89 66    Recent Labs  12/12/15 02/13/16 02/20/16  WBC 6.0 7.4 10.4  HGB 12.3* 12.8* 12.6*  HCT 37* 39* 38*  PLT 253 214 266    Recent Labs  02/13/16 02/20/16  CHOL 159 171  LDLCALC 82 98  TRIG 74 232*   No results found for:  MICROALBUR Lab Results  Component Value Date   TSH 1.43 02/13/2016   Lab Results  Component Value Date   HGBA1C 6.2 02/20/2016   Lab Results  Component Value Date   CHOL 171 02/20/2016   HDL 27 (A) 02/20/2016   LDLCALC 98 02/20/2016   TRIG 232 (A) 02/20/2016    Significant Diagnostic Results in last 30 days:  No results found.  Assessment and Plan  PVD (peripheral vascular disease) ([place]) No lesions or wounds;plan to cont ASA 81 mg daily  GERD (gastroesophageal reflux disease) No reports of aspiration or reflux;plan to cont omeprazole 20 mg daily  Dysphagia No reported aspiration;pt tolerating pureed diet well;plan  to cont Levbid 0.375 mg BID    [name], MD

## 2016-11-28 LAB — BASIC METABOLIC PANEL
BUN: 16 (ref 4–21)
Creatinine: 0.7 (ref 0.6–1.3)
GLUCOSE: 107
Potassium: 4.2 (ref 3.4–5.3)
SODIUM: 139 (ref 137–147)

## 2016-11-28 LAB — CBC AND DIFFERENTIAL
HCT: 45 (ref 41–53)
Hemoglobin: 14.2 (ref 13.5–17.5)
Platelets: 247 (ref 150–399)
WBC: 8.9

## 2016-11-28 LAB — HEPATIC FUNCTION PANEL
ALT: 9 — AB (ref 10–40)
AST: 20 (ref 14–40)
Alkaline Phosphatase: 108 (ref 25–125)
Bilirubin, Total: 0.4

## 2016-11-29 ENCOUNTER — Encounter: Payer: Self-pay | Admitting: Internal Medicine

## 2016-11-29 NOTE — Assessment & Plan Note (Signed)
No reports of aspiration or reflux;plan to cont omeprazole 20 mg daily

## 2016-11-29 NOTE — Assessment & Plan Note (Signed)
No lesions or wounds;plan to cont ASA 81 mg daily

## 2016-11-29 NOTE — Assessment & Plan Note (Signed)
No reported aspiration;pt tolerating pureed diet well;plan to cont Levbid 0.375 mg BID

## 2016-11-30 ENCOUNTER — Encounter: Payer: Self-pay | Admitting: Internal Medicine

## 2016-11-30 NOTE — Progress Notes (Signed)
Location:  [facility] of Service:  SNF (31)SNF [name], MD  Patient Care Team: [name], MD as PCP - General (Internal Medicine)  Extended Emergency Contact Information Primary Emergency Contact: Archibeque,[name] Address: [address]          [place], [place] [address] [name] of [place] Phone: [phone] Mobile Phone: [phone] Relation: Son    Allergies: Patient has no known allergies.  Chief Complaint  Patient presents with  . Acute Visit    HPI: Patient is 83 y.o. male who Is being seen for a 10 pound weight gain over one week. Patient with dementia but nurses have no concerns about patient being short of breath or having chest pain. Nursing does comment that his son force-feeding stem twice a day and that the patient drinks Ensure multiple cans a day.  Past Medical History:  Diagnosis Date  . Actinic keratosis   . Arthritis   . BPH (benign prostatic hyperplasia)   . Cancer ([place])   . Cataract cortical, senile   . Esophageal abnormality   . Gout   . Hyperlipidemia, unspecified   . Hypertension   . PVD (peripheral vascular disease) ([place])      Past Surgical History:  Procedure Laterality Date  . ESOPHAGOGASTRODUODENOSCOPY N/A 09/23/2015   Procedure: ESOPHAGOGASTRODUODENOSCOPY (EGD);  Surgeon: Arta Silence, MD;  [facility];  Service: Endoscopy;  Laterality: N/A;  . ESOPHAGOGASTRODUODENOSCOPY     01/15/2009 , 10/29/2009  . ESOPHAGOGASTRODUODENOSCOPY (EGD) WITH PROPOFOL N/A 08/31/2015   Procedure: ESOPHAGOGASTRODUODENOSCOPY (EGD) WITH PROPOFOL;  Surgeon: Wonda Horner, MD;  [facility];  Service: Endoscopy;  Laterality: N/A;  . FOOT SURGERY    . JOINT REPLACEMENT     Hip  . TONSILLECTOMY      Allergies as of 11/11/2016   No Known Allergies     Medication List       Accurate as of 11/11/16 11:59 PM. Always use your most recent med list.          allopurinol 300 MG  tablet Commonly known as:  ZYLOPRIM Take 300 mg by mouth daily.   aspirin EC 81 MG tablet Take 81 mg by mouth daily.   donepezil 5 MG tablet Commonly known as:  ARICEPT Take 5 mg by mouth daily.   ENSURE PLUS Liqd Take 237 mLs by mouth 2 (two) times daily between meals.   NUTRITIONAL SUPPLEMENTS PO Take 4 oz of MEDPASS by mouth 2 times daily due to weight loss   finasteride 5 MG tablet Commonly known as:  PROSCAR Take 5 mg by mouth daily.   hyoscyamine 0.375 MG 12 hr tablet Commonly known as:  LEVBID Take 0.375 mg by mouth 3 (three) times daily. cramping   mirtazapine 7.5 MG tablet Commonly known as:  REMERON Take 7.5 mg by mouth at bedtime.   MULTIVITAMIN ADULTS PO Take 1 tablet by mouth daily.   omeprazole 20 MG capsule Commonly known as:  PRILOSEC Take 20 mg by mouth daily.   polyethylene glycol packet Commonly known as:  MIRALAX / GLYCOLAX Take 17 g by mouth daily. Hold for diarrhea       No orders of the defined types were placed in this encounter.   Immunization History  Administered Date(s) Administered  . Influenza Split 04/28/2014  . Influenza-Unspecified 03/06/2012, 02/21/2016  . PPD Test 09/08/2015, 09/26/2015    Social History  Substance Use Topics  . Smoking status: Former Research scientist (life sciences)  .  Smokeless tobacco: Never Used  . Alcohol use No    Review of Systems  UTO 2/2 dementia;pt can answer yes or no sometimes;Nursing without concerns and per HPI    Vitals:   11/30/16 1621  BP: 137/72  Pulse: 70  Resp: 19  Temp: 97.6 F (36.4 C)   There is no height or weight on file to calculate BMI. Physical Exam  GENERAL APPEARANCE: Alert, min conversant, No acute distress  SKIN: No diaphoresis rash HEENT: Unremarkable RESPIRATORY: Breathing is even, unlabored. Lung sounds are clear   CARDIOVASCULAR: Heart RRR 1/0XNAT pitched systolic murmur RUSB; no rubs or gallops. trace peripheral edema  GASTROINTESTINAL: Abdomen is soft, non-tender, not  distended w/ normal bowel sounds.  GENITOURINARY: Bladder non tender, not distended  MUSCULOSKELETAL: No abnormal joints or musculature NEUROLOGIC: Cranial nerves 2-12 grossly intact. Moves all extremities PSYCHIATRIC: Mood and affect with dementia, no behavioral issues  Patient Active Problem List   Diagnosis Date Noted  . BPH (benign prostatic hyperplasia)   . Arthritis   . Hyperlipidemia, unspecified   . Cataract cortical, senile   . PVD (peripheral vascular disease) ([place])   . Actinic keratosis   . Hypertension   . Anemia of chronic disease 02/10/2016  . GERD (gastroesophageal reflux disease) 01/10/2016  . Abdominal pain 10/06/2015  . Acute encephalopathy 09/29/2015  . HTN (hypertension) 09/29/2015  . Altered mental status   . Acute respiratory failure ([place]) 09/24/2015  . Malnutrition of moderate degree 09/24/2015  . Dysphagia 09/16/2015  . Arm swelling 09/16/2015  . Gout 09/16/2015  . Pressure ulcer 08/31/2015  . BPH without obstruction/lower urinary tract symptoms 08/31/2015  . Dementia without behavioral disturbance 08/31/2015  . 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 Sepsis ([place]) 08/31/2015  . Pneumonia 08/30/2015  . CAP (community acquired pneumonia) 08/30/2015    CMP     Component Value Date/Time   NA 136 (A) 02/20/2016   K 4.6 02/20/2016   CL 107 09/25/2015 0519   CO2 28 09/25/2015 0519   GLUCOSE 94 09/25/2015 0519   BUN 46 (A) 02/20/2016   CREATININE 1.1 02/20/2016   CREATININE 0.86 09/25/2015 0519   CALCIUM 10.8 (H) 09/25/2015 0519   PROT 6.3 (L) 09/25/2015 0519   ALBUMIN 3.2 (L) 09/25/2015 0519   AST 13 (A) 02/20/2016   ALT 8 (A) 02/20/2016   ALKPHOS 66 02/20/2016   BILITOT 0.6 09/25/2015 0519   GFRNONAA >60 09/25/2015 0519   GFRAA >60 09/25/2015 0519    Recent Labs  12/12/15 02/13/16 02/20/16  NA 134* 135* 136*  K 4.6 4.6 4.6  BUN 15 15 46*  CREATININE 0.9 0.7 1.1    Recent Labs  12/12/15 02/20/16  AST 21 13*  ALT 9* 8*  ALKPHOS 89 66     Recent Labs  12/12/15 02/13/16 02/20/16  WBC 6.0 7.4 10.4  HGB 12.3* 12.8* 12.6*  HCT 37* 39* 38*  PLT 253 214 266    Recent Labs  02/13/16 02/20/16  CHOL 159 171  LDLCALC 82 98  TRIG 74 232*   No results found for: MICROALBUR Lab Results  Component Value Date   TSH 1.43 02/13/2016   Lab Results  Component Value Date   HGBA1C 6.2 02/20/2016   Lab Results  Component Value Date   CHOL 171 02/20/2016   HDL 27 (A) 02/20/2016   LDLCALC 98 02/20/2016   TRIG 232 (A) 02/20/2016    Significant Diagnostic Results in last 30 days:  No results found.  Assessment and Plan  WEIGHT GAIN- As always the major concern with weight gain is whether this could be fluid weight or not; on physical exam patient appears to be euvolemic; will continue to monitor     Inocencio Homes, MD

## 2016-12-03 ENCOUNTER — Other Ambulatory Visit: Payer: Self-pay

## 2016-12-15 ENCOUNTER — Encounter: Payer: Self-pay | Admitting: Internal Medicine

## 2016-12-15 ENCOUNTER — Non-Acute Institutional Stay (SKILLED_NURSING_FACILITY): Payer: Medicare Other | Admitting: Internal Medicine

## 2016-12-15 DIAGNOSIS — N400 Enlarged prostate without lower urinary tract symptoms: Secondary | ICD-10-CM | POA: Diagnosis not present

## 2016-12-15 DIAGNOSIS — M1000 Idiopathic gout, unspecified site: Secondary | ICD-10-CM

## 2016-12-15 DIAGNOSIS — D638 Anemia in other chronic diseases classified elsewhere: Secondary | ICD-10-CM | POA: Diagnosis not present

## 2016-12-15 NOTE — Progress Notes (Signed)
Location:  [facility] Room Number: 106Y Place of Service:  SNF (31)  Jack Duos, MD  Patient Care Team: Jack Duos, MD as PCP - General (Internal Medicine)  Extended Emergency Contact Information Primary Emergency Contact: Jack Cantrell,Jack Cantrell Address: [address]          [place], [place] [address] Jack Cantrell of Jack Cantrell Phone: [phone] Mobile Phone: [phone] Relation: Son    Allergies: Patient has no known allergies.  Chief Complaint  Patient presents with  . Medical Management of Chronic Issues    routine visit    HPI: Patient is 83 y.o. male who Is being seen for routine issues of gout, anemia, and BPH.  Past Medical History:  Diagnosis Date  . Actinic keratosis   . Anemia of chronic disease 02/10/2016  . Arthritis   . BPH (benign prostatic hyperplasia)   . BPH without obstruction/lower urinary tract symptoms 08/31/2015  . Cancer ([place])   . Cataract cortical, senile   . Dementia without behavioral disturbance 08/31/2015  . Esophageal abnormality   . GERD (gastroesophageal reflux disease) 01/10/2016  . Gout   . Hyperlipidemia, unspecified   . Hypertension   . Malnutrition of moderate degree 09/24/2015  . 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 Pneumonia 08/30/2015  . PVD (peripheral vascular disease) ([place])     Past Surgical History:  Procedure Laterality Date  . ESOPHAGOGASTRODUODENOSCOPY N/A 09/23/2015   Procedure: ESOPHAGOGASTRODUODENOSCOPY (EGD);  Surgeon: Arta Silence, MD;  Location: [facility];  Service: Endoscopy;  Laterality: N/A;  . ESOPHAGOGASTRODUODENOSCOPY     01/15/2009 , 10/29/2009  . ESOPHAGOGASTRODUODENOSCOPY (EGD) WITH PROPOFOL N/A 08/31/2015   Procedure: ESOPHAGOGASTRODUODENOSCOPY (EGD) WITH PROPOFOL;  Surgeon: Wonda Horner, MD;  Location: [facility];  Service: Endoscopy;  Laterality: N/A;  . FOOT SURGERY    . JOINT REPLACEMENT     Hip  . TONSILLECTOMY      Allergies as of 12/15/2016   No Known  Allergies     Medication List       Accurate as of 12/15/16 11:59 PM. Always use your most recent med list.          allopurinol 300 MG tablet Commonly known as:  ZYLOPRIM Take 300 mg by mouth daily.   aspirin EC 81 MG tablet Take 81 mg by mouth daily.   donepezil 5 MG tablet Commonly known as:  ARICEPT Take 5 mg by mouth daily.   ENSURE PLUS Liqd Take 237 mLs by mouth 2 (two) times daily between meals.   NUTRITIONAL SUPPLEMENTS PO Take 4 oz of MEDPASS by mouth 2 times daily due to weight loss   finasteride 5 MG tablet Commonly known as:  PROSCAR Take 5 mg by mouth daily.   hyoscyamine 0.375 MG 12 hr tablet Commonly known as:  LEVBID Take 0.375 mg by mouth 3 (three) times daily. cramping   mirtazapine 7.5 MG tablet Commonly known as:  REMERON Take 7.5 mg by mouth at bedtime.   MULTIVITAMIN ADULTS PO Take 1 tablet by mouth daily.   omeprazole 20 MG capsule Commonly known as:  PRILOSEC Take 20 mg by mouth daily.   polyethylene glycol packet Commonly known as:  MIRALAX / GLYCOLAX Take 17 g by mouth daily. Hold for diarrhea       No orders of the defined types were placed in this encounter.   Immunization History  Administered Date(s) Administered  . Influenza Split 04/28/2014  . Influenza-Unspecified 03/06/2012, 02/21/2016  . PPD Test 09/08/2015, 09/26/2015  Social History  Substance Use Topics  . Smoking status: Former Smoker    Years: 6.00  . Smokeless tobacco: Never Used  . Alcohol use No    Review of SystemsUnable to obtain secondary to dementia; nursing-no concerns     Vitals:   12/15/16 1518  BP: 137/72  Pulse: 68  Resp: 20  Temp: 97.9 F (36.6 C)   Body mass index is 22.92 kg/m. Physical Exam  GENERAL APPEARANCE: Alert, conversant, No acute distress  SKIN: No diaphoresis rash HEENT: Unremarkable RESPIRATORY: Breathing is even, unlabored. Lung sounds are clear   CARDIOVASCULAR: Heart RRR 5/6 high pitched systolic murmur  RUSB, no rubs or gallops. Trace peripheral edema  GASTROINTESTINAL: Abdomen is soft, non-tender, not distended w/ normal bowel sounds.  GENITOURINARY: Bladder non tender, not distended  MUSCULOSKELETAL: No abnormal joints or musculature NEUROLOGIC: Cranial nerves 2-12 grossly intact. Moves all extremities PSYCHIATRIC: Mood and affect with dementia, no behavioral issues  Patient Active Problem List   Diagnosis Date Noted  . BPH (benign prostatic hyperplasia)   . Arthritis   . Hyperlipidemia, unspecified   . Cataract cortical, senile   . PVD (peripheral vascular disease) ([place])   . Actinic keratosis   . Hypertension   . Anemia of chronic disease 02/10/2016  . GERD (gastroesophageal reflux disease) 01/10/2016  . Abdominal pain 10/06/2015  . Acute encephalopathy 09/29/2015  . HTN (hypertension) 09/29/2015  . Altered mental status   . Acute respiratory failure ([place]) 09/24/2015  . Malnutrition of moderate degree 09/24/2015  . Dysphagia 09/16/2015  . Arm swelling 09/16/2015  . Gout 09/16/2015  . Pressure ulcer 08/31/2015  . BPH without obstruction/lower urinary tract symptoms 08/31/2015  . Dementia without behavioral disturbance 08/31/2015  . 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 Sepsis ([place]) 08/31/2015  . Pneumonia 08/30/2015  . CAP (community acquired pneumonia) 08/30/2015    CMP     Component Value Date/Time   NA 139 11/28/2016   K 4.2 11/28/2016   CL 107 09/25/2015 0519   CO2 28 09/25/2015 0519   GLUCOSE 94 09/25/2015 0519   BUN 16 11/28/2016   CREATININE 0.7 11/28/2016   CREATININE 0.86 09/25/2015 0519   CALCIUM 10.8 (H) 09/25/2015 0519   PROT 6.3 (L) 09/25/2015 0519   ALBUMIN 3.2 (L) 09/25/2015 0519   AST 20 11/28/2016   ALT 9 (A) 11/28/2016   ALKPHOS 108 11/28/2016   BILITOT 0.6 09/25/2015 0519   GFRNONAA >60 09/25/2015 0519   GFRAA >60 09/25/2015 0519    Recent Labs  02/13/16 02/20/16 11/28/16  NA 135* 136* 139  K 4.6 4.6 4.2  BUN 15 46* 16  CREATININE 0.7  1.1 0.7    Recent Labs  02/20/16 11/28/16  AST 13* 20  ALT 8* 9*  ALKPHOS 66 108    Recent Labs  02/13/16 02/20/16 11/28/16  WBC 7.4 10.4 8.9  HGB 12.8* 12.6* 14.2  HCT 39* 38* 45  PLT 214 266 247    Recent Labs  02/13/16 02/20/16  CHOL 159 171  LDLCALC 82 98  TRIG 74 232*   No results found for: MICROALBUR Lab Results  Component Value Date   TSH 1.43 02/13/2016   Lab Results  Component Value Date   HGBA1C 6.2 02/20/2016   Lab Results  Component Value Date   CHOL 171 02/20/2016   HDL 27 (A) 02/20/2016   LDLCALC 98 02/20/2016   TRIG 232 (A) 02/20/2016    Significant Diagnostic Results in last 30 days:  No results found.  Assessment and Plan  Gout No reported acute flares; plan to continue allopurinol 300 mg by mouth daily  Anemia of chronic disease Recent hemoglobin is 14.5 which is improved from prior on no supplements; this is excellent; we'll monitor at intervals  BPH (benign prostatic hyperplasia) No reported problems or infections; plan to continue Proscar 5 mg by mouth daily    Kaytie Ratcliffe D. [name], MD

## 2016-12-17 ENCOUNTER — Non-Acute Institutional Stay (SKILLED_NURSING_FACILITY): Payer: Medicare Other

## 2016-12-17 DIAGNOSIS — Z0000 Encounter for general adult medical examination without abnormal findings: Secondary | ICD-10-CM | POA: Diagnosis not present

## 2016-12-17 NOTE — Progress Notes (Signed)
Subjective:   Jack Cantrell is a 83 y.o. male who presents for an Initial Medicare Annual Wellness Visit at [facility]    Objective:    Today's Vitals   12/17/16 1455  BP: 122/70  Pulse: 60  Temp: (!) 97.3 F (36.3 C)  TempSrc: Oral  SpO2: 96%  Weight: 129 lb (58.5 kg)  Height: 5\' 3"  (1.6 m)   Body mass index is 22.85 kg/m.  Current Medications (verified) Outpatient Encounter Prescriptions as of 12/17/2016  Medication Sig  . allopurinol (ZYLOPRIM) 300 MG tablet Take 300 mg by mouth daily.  Marland Kitchen aspirin EC 81 MG tablet Take 81 mg by mouth daily.  Marland Kitchen donepezil (ARICEPT) 5 MG tablet Take 5 mg by mouth daily.   Marland Kitchen ENSURE PLUS (ENSURE PLUS) LIQD Take 237 mLs by mouth 2 (two) times daily between meals.  . finasteride (PROSCAR) 5 MG tablet Take 5 mg by mouth daily.  . hyoscyamine (LEVBID) 0.375 MG 12 hr tablet Take 0.375 mg by mouth 3 (three) times daily. cramping  . mirtazapine (REMERON) 7.5 MG tablet Take 7.5 mg by mouth at bedtime.  . Multiple Vitamins-Minerals (MULTIVITAMIN ADULTS PO) Take 1 tablet by mouth daily.  Marland Kitchen NUTRITIONAL SUPPLEMENTS PO Take 4 oz of MEDPASS by mouth 2 times daily due to weight loss  . omeprazole (PRILOSEC) 20 MG capsule Take 20 mg by mouth daily.   . polyethylene glycol (MIRALAX / GLYCOLAX) packet Take 17 g by mouth daily. Hold for diarrhea   No facility-administered encounter medications on file as of 12/17/2016.     Allergies (verified) Patient has no known allergies.   History: Past Medical History:  Diagnosis Date  . Actinic keratosis   . Anemia of chronic disease 02/10/2016  . Arthritis   . BPH (benign prostatic hyperplasia)   . BPH without obstruction/lower urinary tract symptoms 08/31/2015  . Cancer ([place])   . Cataract cortical, senile   . Dementia without behavioral disturbance 08/31/2015  . Esophageal abnormality   . GERD (gastroesophageal reflux disease) 01/10/2016  . Gout   . Hyperlipidemia, unspecified   . Hypertension     . Malnutrition of moderate degree 09/24/2015  . 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 Pneumonia 08/30/2015  . PVD (peripheral vascular disease) ([place])    Past Surgical History:  Procedure Laterality Date  . ESOPHAGOGASTRODUODENOSCOPY N/A 09/23/2015   Procedure: ESOPHAGOGASTRODUODENOSCOPY (EGD);  Surgeon: Arta Silence, MD;  Location[facility];  Service: Endoscopy;  Laterality: N/A;  . ESOPHAGOGASTRODUODENOSCOPY     01/15/2009 , 10/29/2009  . ESOPHAGOGASTRODUODENOSCOPY (EGD) WITH PROPOFOL N/A 08/31/2015   Procedure: ESOPHAGOGASTRODUODENOSCOPY (EGD) WITH PROPOFOL;  Surgeon: Wonda Horner, MD;  [facility];  Service: Endoscopy;  Laterality: N/A;  . FOOT SURGERY    . JOINT REPLACEMENT     Hip  . TONSILLECTOMY     Family History  Problem Relation Age of Onset  . Heart disease Brother        CHF  . Heart disease Brother   . Hypertension Brother   . Colon polyps Son    Social History   Occupational History  . retired Pharmacist, hospital    Social History Main Topics  . Smoking status: Former Smoker    Years: 6.00  . Smokeless tobacco: Never Used  . Alcohol use No  . Drug use: No  . Sexual activity: No   Tobacco Counseling Counseling given: Not Answered   Activities of Daily Living In your present state of health, do you have  any difficulty performing the following activities: 12/17/2016  Hearing? Y  Vision? Y  Difficulty concentrating or making decisions? Y  Walking or climbing stairs? Y  Dressing or bathing? Y  Doing errands, shopping? Y  Preparing Food and eating ? Y  Using the Toilet? Y  In the past six months, have you accidently leaked urine? Y  Do you have problems with loss of bowel control? Y  Managing your Medications? Y  Managing your Finances? Y  Housekeeping or managing your Housekeeping? Y  Some recent data might be hidden    Immunizations and Health Maintenance Immunization History  Administered Date(s) Administered  . Influenza Split 04/28/2014  .  Influenza-Unspecified 03/06/2012, 02/21/2016  . PPD Test 09/08/2015, 09/26/2015   There are no preventive care reminders to display for this patient.  Patient Care Team: [name], MD as PCP - General (Internal Medicine)  Indicate any recent Medical Services you may have received from other than [facility] providers in the past year (date may be approximate).    Assessment:   This is a routine wellness examination for Jack Cantrell.   Hearing/Vision screen No exam data present  Dietary issues and exercise activities discussed: Current Exercise Habits: The patient does not participate in regular exercise at present, Exercise limited by: neurologic condition(s)  Goals    None     Depression Screen PHQ 2/9 Scores 12/17/2016  PHQ - 2 Score 0    Fall Risk Fall Risk  12/17/2016  Falls in the past year? No    Cognitive Function:     6CIT Screen 12/17/2016  What Year? 4 points  What month? 3 points  What time? 0 points  Count back from 20 0 points  Months in reverse 4 points  Repeat phrase 10 points  Total Score 21    Screening Tests Health Maintenance  Topic Date Due  . PNA vac Low Risk Adult (1 of 2 - PCV13) 05/26/2017 (Originally 08/25/1979)  . TETANUS/TDAP  05/27/2023 (Originally 08/26/1933)  . INFLUENZA VACCINE  12/24/2016        Plan:    I have personally reviewed and addressed the Medicare Annual Wellness questionnaire and have noted the following in the patient's chart:  A. Medical and social history B. Use of alcohol, tobacco or illicit drugs  C. Current medications and supplements D. Functional ability and status E.  Nutritional status F.  Physical activity G. Advance directives H. List of other physicians I.  Hospitalizations, surgeries, and ER visits in previous 12 months J.  [place] to include hearing, vision, cognitive, depression L. Referrals and appointments - none  In addition, I have reviewed and discussed with patient certain  preventive protocols, quality metrics, and best practice recommendations. A written personalized care plan for preventive services as well as general preventive health recommendations were provided to patient.  See attached scanned questionnaire for additional information.   Signed,   Rich Reining, RN Nurse Health Advisor   Quick Notes   Health Maintenance: PNA 13, tDAp, due     Abnormal Screen: 6 CIT-21     Patient Concerns: None     Nurse Concerns: None

## 2016-12-17 NOTE — Patient Instructions (Signed)
Jack Cantrell , Thank you for taking time to come for your Medicare Wellness Visit. I appreciate your ongoing commitment to your health goals. Please review the following plan we discussed and let me know if I can assist you in the future.   Screening recommendations/referrals: Colonoscopy excluded, pt over age 81 Recommended yearly ophthalmology/optometry visit for glaucoma screening and checkup Recommended yearly dental visit for hygiene and checkup  Vaccinations: Influenza vaccine up to date, due 02/20/17 Pneumococcal vaccine 13 due, ordered Tdap vaccine due, ordered Shingles vaccine not in records  Advanced directives: DNR in chart, copies of health care power of attorney and living will are needed   Conditions/risks identified: None  Next appointment: Dr. [name] makes rounds  Preventive Care 60 Years and Older, Male Preventive care refers to lifestyle choices and visits with your health care provider that can promote health and wellness. What does preventive care include?  A yearly physical exam. This is also called an annual well check.  Dental exams once or twice a year.  Routine eye exams. Ask your health care provider how often you should have your eyes checked.  Personal lifestyle choices, including:  Daily care of your teeth and gums.  Regular physical activity.  Eating a healthy diet.  Avoiding tobacco and drug use.  Limiting alcohol use.  Practicing safe sex.  Taking low doses of aspirin every day.  Taking vitamin and mineral supplements as recommended by your health care provider. What happens during an annual well check? The services and screenings done by your health care provider during your annual well check will depend on your age, overall health, lifestyle risk factors, and family history of disease. Counseling  Your health care provider may ask you questions about your:  Alcohol use.  Tobacco use.  Drug use.  Emotional well-being.  Home  and relationship well-being.  Sexual activity.  Eating habits.  History of falls.  Memory and ability to understand (cognition).  Work and work Statistician. Screening  You may have the following tests or measurements:  Height, weight, and BMI.  Blood pressure.  Lipid and cholesterol levels. These may be checked every 5 years, or more frequently if you are over 98 years old.  Skin check.  Lung cancer screening. You may have this screening every year starting at age 1 if you have a 30-pack-year history of smoking and currently smoke or have quit within the past 15 years.  Fecal occult blood test (FOBT) of the stool. You may have this test every year starting at age 50.  Flexible sigmoidoscopy or colonoscopy. You may have a sigmoidoscopy every 5 years or a colonoscopy every 10 years starting at age 26.  Prostate cancer screening. Recommendations will vary depending on your family history and other risks.  Hepatitis C blood test.  Hepatitis B blood test.  Sexually transmitted disease (STD) testing.  Diabetes screening. This is done by checking your blood sugar (glucose) after you have not eaten for a while (fasting). You may have this done every 1-3 years.  Abdominal aortic aneurysm (AAA) screening. You may need this if you are a current or former smoker.  Osteoporosis. You may be screened starting at age 57 if you are at high risk. Talk with your health care provider about your test results, treatment options, and if necessary, the need for more tests. Vaccines  Your health care provider may recommend certain vaccines, such as:  Influenza vaccine. This is recommended every year.  Tetanus, diphtheria, and acellular pertussis (Tdap,  Td) vaccine. You may need a Td booster every 10 years.  Zoster vaccine. You may need this after age 14.  Pneumococcal 13-valent conjugate (PCV13) vaccine. One dose is recommended after age 51.  Pneumococcal polysaccharide (PPSV23) vaccine.  One dose is recommended after age 24. Talk to your health care provider about which screenings and vaccines you need and how often you need them. This information is not intended to replace advice given to you by your health care provider. Make sure you discuss any questions you have with your health care provider. Document Released: 06/08/2015 Document Revised: 01/30/2016 Document Reviewed: 03/13/2015 Elsevier Interactive Patient Education  2017 [employer] Prevention in the Home Falls can cause injuries. They can happen to people of all ages. There are many things you can do to make your home safe and to help prevent falls. What can I do on the outside of my home?  Regularly fix the edges of walkways and driveways and fix any cracks.  Remove anything that might make you trip as you walk through a door, such as a raised step or threshold.  Trim any bushes or trees on the path to your home.  Use bright outdoor lighting.  Clear any walking paths of anything that might make someone trip, such as rocks or tools.  Regularly check to see if handrails are loose or broken. Make sure that both sides of any steps have handrails.  Any raised decks and porches should have guardrails on the edges.  Have any leaves, snow, or ice cleared regularly.  Use sand or salt on walking paths during winter.  Clean up any spills in your garage right away. This includes oil or grease spills. What can I do in the bathroom?  Use night lights.  Install grab bars by the toilet and in the tub and shower. Do not use towel bars as grab bars.  Use non-skid mats or decals in the tub or shower.  If you need to sit down in the shower, use a plastic, non-slip stool.  Keep the floor dry. Clean up any water that spills on the floor as soon as it happens.  Remove soap buildup in the tub or shower regularly.  Attach bath mats securely with double-sided non-slip rug tape.  Do not have throw rugs and other  things on the floor that can make you trip. What can I do in the bedroom?  Use night lights.  Make sure that you have a light by your bed that is easy to reach.  Do not use any sheets or blankets that are too big for your bed. They should not hang down onto the floor.  Have a firm chair that has side arms. You can use this for support while you get dressed.  Do not have throw rugs and other things on the floor that can make you trip. What can I do in the kitchen?  Clean up any spills right away.  Avoid walking on wet floors.  Keep items that you use a lot in easy-to-reach places.  If you need to reach something above you, use a strong step stool that has a grab bar.  Keep electrical cords out of the way.  Do not use floor polish or wax that makes floors slippery. If you must use wax, use non-skid floor wax.  Do not have throw rugs and other things on the floor that can make you trip. What can I do with my stairs?  Do not  leave any items on the stairs.  Make sure that there are handrails on both sides of the stairs and use them. Fix handrails that are broken or loose. Make sure that handrails are as long as the stairways.  Check any carpeting to make sure that it is firmly attached to the stairs. Fix any carpet that is loose or worn.  Avoid having throw rugs at the top or bottom of the stairs. If you do have throw rugs, attach them to the floor with carpet tape.  Make sure that you have a light switch at the top of the stairs and the bottom of the stairs. If you do not have them, ask someone to add them for you. What else can I do to help prevent falls?  Wear shoes that:  Do not have high heels.  Have rubber bottoms.  Are comfortable and fit you well.  Are closed at the toe. Do not wear sandals.  If you use a stepladder:  Make sure that it is fully opened. Do not climb a closed stepladder.  Make sure that both sides of the stepladder are locked into place.  Ask  someone to hold it for you, if possible.  Clearly mark and make sure that you can see:  Any grab bars or handrails.  First and last steps.  Where the edge of each step is.  Use tools that help you move around (mobility aids) if they are needed. These include:  Canes.  Walkers.  Scooters.  Crutches.  Turn on the lights when you go into a dark area. Replace any light bulbs as soon as they burn out.  Set up your furniture so you have a clear path. Avoid moving your furniture around.  If any of your floors are uneven, fix them.  If there are any pets around you, be aware of where they are.  Review your medicines with your doctor. Some medicines can make you feel dizzy. This can increase your chance of falling. Ask your doctor what other things that you can do to help prevent falls. This information is not intended to replace advice given to you by your health care provider. Make sure you discuss any questions you have with your health care provider. Document Released: 03/08/2009 Document Revised: 10/18/2015 Document Reviewed: 06/16/2014 Elsevier Interactive Patient Education  2017 [employer].

## 2017-01-11 ENCOUNTER — Encounter: Payer: Self-pay | Admitting: Internal Medicine

## 2017-01-11 NOTE — Assessment & Plan Note (Signed)
No reported acute flares; plan to continue allopurinol 300 mg by mouth daily

## 2017-01-11 NOTE — Assessment & Plan Note (Signed)
Recent hemoglobin is 14.5 which is improved from prior on no supplements; this is excellent; we'll monitor at intervals

## 2017-01-11 NOTE — Assessment & Plan Note (Signed)
No reported problems or infections; plan to continue Proscar 5 mg by mouth daily

## 2017-01-20 ENCOUNTER — Non-Acute Institutional Stay (SKILLED_NURSING_FACILITY): Payer: Medicare Other | Admitting: Internal Medicine

## 2017-01-20 ENCOUNTER — Encounter: Payer: Self-pay | Admitting: Internal Medicine

## 2017-01-20 DIAGNOSIS — F0390 Unspecified dementia without behavioral disturbance: Secondary | ICD-10-CM

## 2017-01-20 DIAGNOSIS — K224 Dyskinesia of esophagus: Secondary | ICD-10-CM

## 2017-01-20 DIAGNOSIS — I10 Essential (primary) hypertension: Secondary | ICD-10-CM

## 2017-01-20 NOTE — Progress Notes (Signed)
Location:  [facility] Room Number: 161W Place of Service:  SNF (31)  [name], MD  Patient Care Team: [name], MD as PCP - General (Internal Medicine)  Extended Emergency Contact Information Primary Emergency Contact: Stitt,[name] Address: [address]          [place],  [address] [name] of [place] Phone: [phone] Mobile Phone: [phone] Relation: Son    Allergies: Patient has no known allergies.  Chief Complaint  Patient presents with  . Medical Management of Chronic Issues    Route visit    HPI: Patient is 83 y.o. male who Is being seen for routine issues of hypertension, Nutcracker esophagus, and dementia.  Past Medical History:  Diagnosis Date  . Actinic keratosis   . Anemia of chronic disease 02/10/2016  . Arthritis   . BPH (benign prostatic hyperplasia)   . BPH without obstruction/lower urinary tract symptoms 08/31/2015  . Cancer ([place])   . Cataract cortical, senile   . Dementia without behavioral disturbance 08/31/2015  . Esophageal abnormality   . GERD (gastroesophageal reflux disease) 01/10/2016  . Gout   . Hyperlipidemia, unspecified   . Hypertension   . Malnutrition of moderate degree 09/24/2015  . 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 Pneumonia 08/30/2015  . PVD (peripheral vascular disease) ([place])     Past Surgical History:  Procedure Laterality Date  . ESOPHAGOGASTRODUODENOSCOPY N/A 09/23/2015   Procedure: ESOPHAGOGASTRODUODENOSCOPY (EGD);  Surgeon: Arta Silence, MD;  Location: [facility];  Service: Endoscopy;  Laterality: N/A;  . ESOPHAGOGASTRODUODENOSCOPY     01/15/2009 , 10/29/2009  . ESOPHAGOGASTRODUODENOSCOPY (EGD) WITH PROPOFOL N/A 08/31/2015   Procedure: ESOPHAGOGASTRODUODENOSCOPY (EGD) WITH PROPOFOL;  Surgeon: Wonda Horner, MD;  Location: [facility];  Service: Endoscopy;  Laterality: N/A;  . FOOT SURGERY    . JOINT REPLACEMENT     Hip  . TONSILLECTOMY      Allergies as of  01/20/2017   No Known Allergies     Medication List       Accurate as of 01/20/17 11:59 PM. Always use your most recent med list.          allopurinol 300 MG tablet Commonly known as:  ZYLOPRIM Take 300 mg by mouth daily.   aspirin EC 81 MG tablet Take 81 mg by mouth daily.   donepezil 5 MG tablet Commonly known as:  ARICEPT Take 5 mg by mouth daily.   ENSURE PLUS Liqd Take 237 mLs by mouth 2 (two) times daily between meals.   NUTRITIONAL SUPPLEMENTS PO Take 4 oz of MEDPASS by mouth 2 times daily due to weight loss   finasteride 5 MG tablet Commonly known as:  PROSCAR Take 5 mg by mouth daily.   hyoscyamine 0.375 MG 12 hr tablet Commonly known as:  LEVBID Take 0.375 mg by mouth 3 (three) times daily. cramping   mirtazapine 7.5 MG tablet Commonly known as:  REMERON Take 7.5 mg by mouth at bedtime.   MULTIVITAMIN ADULTS PO Take 1 tablet by mouth daily.   omeprazole 20 MG capsule Commonly known as:  PRILOSEC Take 20 mg by mouth daily.   polyethylene glycol packet Commonly known as:  MIRALAX / GLYCOLAX Take 17 g by mouth daily. Hold for diarrhea   Vitamin D (Ergocalciferol) 50000 units Caps capsule Commonly known as:  DRISDOL Take 50,000 Units by mouth. Take one capsule once a month on the 7th       Meds ordered this encounter  Medications  .  Vitamin D, Ergocalciferol, (DRISDOL) 50000 units CAPS capsule    Sig: Take 50,000 Units by mouth. Take one capsule once a month on the 7th    Immunization History  Administered Date(s) Administered  . Influenza Split 04/28/2014  . Influenza-Unspecified 03/06/2012, 02/21/2016  . PPD Test 09/08/2015, 09/26/2015    Social History  Substance Use Topics  . Smoking status: Former Smoker    Years: 6.00  . Smokeless tobacco: Never Used  . Alcohol use No    Review of Systems  DATA OBTAINED: from patient-Limited; nursing-no concerns GENERAL:  no fevers, fatigue, appetite changes SKIN: No itching, rash HEENT:  No complaint RESPIRATORY: No cough, wheezing, SOB CARDIAC: No chest pain, palpitations, lower extremity edema  GI: No abdominal pain, No N/V/D or constipation, No heartburn or reflux  GU: No dysuria, frequency or urgency, or incontinence  MUSCULOSKELETAL: No unrelieved bone/joint pain NEUROLOGIC: No headache, dizziness  PSYCHIATRIC: No overt anxiety or sadness  Vitals:   01/20/17 1325  BP: 137/72  Pulse: 82  Resp: 20  Temp: 98.7 F (37.1 C)   Body mass index is 22.67 kg/m. Physical Exam  GENERAL APPEARANCE: Alert, conversant, No acute distress  SKIN: No diaphoresis rash HEENT: Unremarkable RESPIRATORY: Breathing is even, unlabored. Lung sounds are clear   CARDIOVASCULAR: Heart RRR no murmurs, rubs or gallops. No peripheral edema  GASTROINTESTINAL: Abdomen is soft, non-tender, not distended w/ normal bowel sounds.  GENITOURINARY: Bladder non tender, not distended  MUSCULOSKELETAL: No abnormal joints or musculature NEUROLOGIC: Cranial nerves 2-12 grossly intact. Moves all extremities PSYCHIATRIC: Mood and affect With dementia, no behavioral issues  Physical exam has not changed since prior visit.  Patient Active Problem List   Diagnosis Date Noted  . BPH (benign prostatic hyperplasia)   . Arthritis   . Hyperlipidemia, unspecified   . Cataract cortical, senile   . PVD (peripheral vascular disease) ([place])   . Actinic keratosis   . Hypertension   . Anemia of chronic disease 02/10/2016  . GERD (gastroesophageal reflux disease) 01/10/2016  . Abdominal pain 10/06/2015  . Acute encephalopathy 09/29/2015  . HTN (hypertension) 09/29/2015  . Altered mental status   . Acute respiratory failure ([place]) 09/24/2015  . Malnutrition of moderate degree 09/24/2015  . Dysphagia 09/16/2015  . Arm swelling 09/16/2015  . Gout 09/16/2015  . Pressure ulcer 08/31/2015  . BPH without obstruction/lower urinary tract symptoms 08/31/2015  . Dementia without behavioral disturbance 08/31/2015  .  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 Sepsis ([place]) 08/31/2015  . Pneumonia 08/30/2015  . CAP (community acquired pneumonia) 08/30/2015    CMP     Component Value Date/Time   NA 139 11/28/2016   K 4.2 11/28/2016   CL 107 09/25/2015 0519   CO2 28 09/25/2015 0519   GLUCOSE 94 09/25/2015 0519   BUN 16 11/28/2016   CREATININE 0.7 11/28/2016   CREATININE 0.86 09/25/2015 0519   CALCIUM 10.8 (H) 09/25/2015 0519   PROT 6.3 (L) 09/25/2015 0519   ALBUMIN 3.2 (L) 09/25/2015 0519   AST 20 11/28/2016   ALT 9 (A) 11/28/2016   ALKPHOS 108 11/28/2016   BILITOT 0.6 09/25/2015 0519   GFRNONAA >60 09/25/2015 0519   GFRAA >60 09/25/2015 0519    Recent Labs  02/13/16 02/20/16 11/28/16  NA 135* 136* 139  K 4.6 4.6 4.2  BUN 15 46* 16  CREATININE 0.7 1.1 0.7    Recent Labs  02/20/16 11/28/16  AST 13* 20  ALT 8* 9*  ALKPHOS 66 108    Recent  Labs  02/13/16 02/20/16 11/28/16  WBC 7.4 10.4 8.9  HGB 12.8* 12.6* 14.2  HCT 39* 38* 45  PLT 214 266 247    Recent Labs  02/13/16 02/20/16  CHOL 159 171  LDLCALC 82 98  TRIG 74 232*   No results found for: [facility] Lab Results  Component Value Date   TSH 1.43 02/13/2016   Lab Results  Component Value Date   HGBA1C 6.2 02/20/2016   Lab Results  Component Value Date   CHOL 171 02/20/2016   HDL 27 (A) 02/20/2016   LDLCALC 98 02/20/2016   TRIG 232 (A) 02/20/2016    Significant Diagnostic Results in last 30 days:  No results found.  Assessment and Plan  Hypertension Controlled on no medication; continue to monitor   Nutcracker esophagus No reported problems, no reported choking's; continue to monitor  Dementia without behavioral disturbance Stable without apparent declines; continue Aricept 5 mg by mouth daily    Anne D. [name], MD

## 2017-02-05 ENCOUNTER — Encounter: Payer: Self-pay | Admitting: Internal Medicine

## 2017-02-05 NOTE — Assessment & Plan Note (Signed)
Controlled on no medication; continue to monitor 

## 2017-02-05 NOTE — Assessment & Plan Note (Signed)
Stable without apparent declines; continue Aricept 5 mg by mouth daily

## 2017-02-05 NOTE — Assessment & Plan Note (Signed)
No reported problems, no reported choking's; continue to monitor

## 2017-02-20 ENCOUNTER — Non-Acute Institutional Stay (SKILLED_NURSING_FACILITY): Payer: Medicare Other | Admitting: Internal Medicine

## 2017-02-20 ENCOUNTER — Encounter: Payer: Self-pay | Admitting: Internal Medicine

## 2017-02-20 DIAGNOSIS — R1319 Other dysphagia: Secondary | ICD-10-CM

## 2017-02-20 DIAGNOSIS — I739 Peripheral vascular disease, unspecified: Secondary | ICD-10-CM

## 2017-02-20 DIAGNOSIS — K219 Gastro-esophageal reflux disease without esophagitis: Secondary | ICD-10-CM

## 2017-02-20 NOTE — Progress Notes (Signed)
Location:  [facility] Room Number: 381O Place of Service:  SNF (31)  [name], MD  Patient Care Team: [name], MD as PCP - General (Internal Medicine)  Extended Emergency Contact Information Primary Emergency Contact: Waldman,[name] Address: [address]          [place], [place] [address] [name] of [place] Phone: [phone] Mobile Phone: [phone] Relation: Son    Allergies: Patient has no known allergies.  Chief Complaint  Patient presents with  . Medical Management of Chronic Issues    routine visit    HPI: Patient is 83 y.o. male who who is being seen for routine issues of GERD, dysphagia, and peripheral vascular disease.  Past Medical History:  Diagnosis Date  . Actinic keratosis   . Anemia of chronic disease 02/10/2016  . Arthritis   . BPH (benign prostatic hyperplasia)   . BPH without obstruction/lower urinary tract symptoms 08/31/2015  . Cancer ([place])   . Cataract cortical, senile   . Dementia without behavioral disturbance 08/31/2015  . Esophageal abnormality   . GERD (gastroesophageal reflux disease) 01/10/2016  . Gout   . Hyperlipidemia, unspecified   . Hypertension   . Malnutrition of moderate degree 09/24/2015  . 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 Pneumonia 08/30/2015  . PVD (peripheral vascular disease) ([place])     Past Surgical History:  Procedure Laterality Date  . ESOPHAGOGASTRODUODENOSCOPY     01/15/2009 , 10/29/2009  . FOOT SURGERY    . JOINT REPLACEMENT     Hip  . TONSILLECTOMY      Allergies as of 02/20/2017   No Known Allergies     Medication List        Accurate as of 02/20/17 11:59 PM. Always use your most recent med list.          allopurinol 300 MG tablet Commonly known as:  ZYLOPRIM Take 300 mg by mouth daily.   aspirin EC 81 MG tablet Take 81 mg by mouth daily.   donepezil 5 MG tablet Commonly known as:  ARICEPT Take 5 mg by mouth daily.   ENSURE PLUS  Liqd Take 237 mLs by mouth 2 (two) times daily between meals.   NUTRITIONAL SUPPLEMENTS PO Take 4 oz of MEDPASS by mouth 2 times daily due to weight loss   finasteride 5 MG tablet Commonly known as:  PROSCAR Take 5 mg by mouth daily.   hyoscyamine 0.375 MG 12 hr tablet Commonly known as:  LEVBID Take 0.375 mg by mouth 3 (three) times daily. cramping   mirtazapine 7.5 MG tablet Commonly known as:  REMERON Take 7.5 mg by mouth at bedtime.   MULTIVITAMIN ADULTS PO Take 1 tablet by mouth daily.   omeprazole 20 MG capsule Commonly known as:  PRILOSEC Take 20 mg by mouth daily.   polyethylene glycol packet Commonly known as:  MIRALAX / GLYCOLAX Take 17 g by mouth daily. Hold for diarrhea   Vitamin D (Ergocalciferol) 50000 units Caps capsule Commonly known as:  DRISDOL Take 50,000 Units by mouth. Take one capsule once a month on the 7th       No orders of the defined types were placed in this encounter.   Immunization History  Administered Date(s) Administered  . Influenza Split 04/28/2014  . Influenza-Unspecified 03/06/2012, 02/21/2016, 03/06/2017  . PPD Test 09/08/2015, 09/26/2015    Social History   Tobacco Use  . Smoking status: Former Smoker    Years: 6.00  . Smokeless  tobacco: Never Used  Substance Use Topics  . Alcohol use: No    Review of Systems  DATA OBTAINED: from patient-very limited; nursing-no concerns GENERAL:  no fevers, fatigue, appetite changes SKIN: No itching, rash HEENT: No complaint RESPIRATORY: No cough, wheezing, SOB CARDIAC: No chest pain, palpitations, lower extremity edema  GI: No abdominal pain, No N/V/D or constipation, No heartburn or reflux  GU: No dysuria, frequency or urgency, or incontinence  MUSCULOSKELETAL: No unrelieved bone/joint pain NEUROLOGIC: No headache, dizziness  PSYCHIATRIC: No overt anxiety or sadness  Vitals:   02/20/17 1501  BP: 137/72  Pulse: 74  Resp: (!) 22  Temp: 98.7 F (37.1 C)   Body mass  index is 22.96 kg/m. Physical Exam  GENERAL APPEARANCE: Alert, conversant, No acute distress  SKIN: No diaphoresis rash HEENT: Unremarkable RESPIRATORY: Breathing is even, unlabored. Lung sounds are clear   CARDIOVASCULAR: Heart RRR no murmurs, rubs or gallops. No peripheral edema  GASTROINTESTINAL: Abdomen is soft, non-tender, not distended w/ normal bowel sounds.  GENITOURINARY: Bladder non tender, not distended  MUSCULOSKELETAL: No abnormal joints or musculature NEUROLOGIC: Cranial nerves 2-12 grossly intact. Moves all extremities PSYCHIATRIC: Mood and affect ith dementia, no behavioral issues  Patient Active Problem List   Diagnosis Date Noted  . BPH (benign prostatic hyperplasia)   . Arthritis   . Hyperlipidemia, unspecified   . Cataract cortical, senile   . PVD (peripheral vascular disease) ([place])   . Actinic keratosis   . Hypertension   . Anemia of chronic disease 02/10/2016  . GERD (gastroesophageal reflux disease) 01/10/2016  . Abdominal pain 10/06/2015  . Acute encephalopathy 09/29/2015  . HTN (hypertension) 09/29/2015  . Altered mental status   . Acute respiratory failure ([place]) 09/24/2015  . Malnutrition of moderate degree 09/24/2015  . Dysphagia 09/16/2015  . Arm swelling 09/16/2015  . Gout 09/16/2015  . Pressure ulcer 08/31/2015  . BPH without obstruction/lower urinary tract symptoms 08/31/2015  . Dementia without behavioral disturbance 08/31/2015  . 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 Sepsis ([place]) 08/31/2015  . Pneumonia 08/30/2015  . CAP (community acquired pneumonia) 08/30/2015    CMP     Component Value Date/Time   NA 139 11/28/2016   K 4.2 11/28/2016   CL 107 09/25/2015 0519   CO2 28 09/25/2015 0519   GLUCOSE 94 09/25/2015 0519   BUN 16 11/28/2016   CREATININE 0.7 11/28/2016   CREATININE 0.86 09/25/2015 0519   CALCIUM 10.8 (H) 09/25/2015 0519   PROT 6.3 (L) 09/25/2015 0519   ALBUMIN 3.2 (L) 09/25/2015 0519   AST 20 11/28/2016   ALT 9 (A)  11/28/2016   ALKPHOS 108 11/28/2016   BILITOT 0.6 09/25/2015 0519   GFRNONAA >60 09/25/2015 0519   GFRAA >60 09/25/2015 0519   Recent Labs    11/28/16  NA 139  K 4.2  BUN 16  CREATININE 0.7   Recent Labs    11/28/16  AST 20  ALT 9*  ALKPHOS 108   Recent Labs    11/28/16  WBC 8.9  HGB 14.2  HCT 45  PLT 247   No results for input(s): CHOL, LDLCALC, TRIG in the last 8760 hours.  Invalid input(s): HCL No results found for: [facility] Lab Results  Component Value Date   TSH 1.43 02/13/2016   Lab Results  Component Value Date   HGBA1C 6.2 02/20/2016   Lab Results  Component Value Date   CHOL 171 02/20/2016   HDL 27 (A) 02/20/2016   LDLCALC 98 02/20/2016  TRIG 232 (A) 02/20/2016    Significant Diagnostic Results in last 30 days:  No results found.  Assessment and Plan  GERD (gastroesophageal reflux disease) o reports of reflux or aspiration; plan to continue omeprazole 20 mg by mouth daily  Dysphagia No report of aspiration;patient continues to tolerate pured diet well; continue Levbid 0.375 mg by mouth twice a day  PVD (peripheral vascular disease) ([facility]) No wounds or lesions;continue ASA 81 mg by mouth daily    Jack Cantrell D. [name], MD

## 2017-03-20 ENCOUNTER — Non-Acute Institutional Stay (SKILLED_NURSING_FACILITY): Payer: Medicare Other | Admitting: Internal Medicine

## 2017-03-20 ENCOUNTER — Encounter: Payer: Self-pay | Admitting: Internal Medicine

## 2017-03-20 DIAGNOSIS — D638 Anemia in other chronic diseases classified elsewhere: Secondary | ICD-10-CM

## 2017-03-20 DIAGNOSIS — N400 Enlarged prostate without lower urinary tract symptoms: Secondary | ICD-10-CM | POA: Diagnosis not present

## 2017-03-20 DIAGNOSIS — M1000 Idiopathic gout, unspecified site: Secondary | ICD-10-CM

## 2017-03-20 NOTE — Progress Notes (Signed)
Location:  [facility] Room Number: 417E Place of Service:  SNF (31)  [name], MD  Patient Care Team: [name], MD as PCP - General (Internal Medicine)  Extended Emergency Contact Information Primary Emergency Contact: Demetro,[name] Address: [address]          [place], [place] [address] [name] of [place] Phone: [phone] Mobile Phone: [phone] Relation: Son    Allergies: Patient has no known allergies.  Chief Complaint  Patient presents with  . Medical Management of Chronic Issues    routine visit    HPI: Patient is 83 y.o. male who is being seen for routine issues of BPH, gout, and anemia of chronic disease.  Past Medical History:  Diagnosis Date  . Actinic keratosis   . Anemia of chronic disease 02/10/2016  . Arthritis   . BPH (benign prostatic hyperplasia)   . BPH without obstruction/lower urinary tract symptoms 08/31/2015  . Cancer ([place])   . Cataract cortical, senile   . Dementia without behavioral disturbance 08/31/2015  . Esophageal abnormality   . GERD (gastroesophageal reflux disease) 01/10/2016  . Gout   . Hyperlipidemia, unspecified   . Hypertension   . Malnutrition of moderate degree 09/24/2015  . 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 Pneumonia 08/30/2015  . PVD (peripheral vascular disease) ([place])     Past Surgical History:  Procedure Laterality Date  . ESOPHAGOGASTRODUODENOSCOPY     01/15/2009 , 10/29/2009  . FOOT SURGERY    . JOINT REPLACEMENT     Hip  . TONSILLECTOMY      Allergies as of 03/20/2017   No Known Allergies     Medication List        Accurate as of 03/20/17 11:59 PM. Always use your most recent med list.          allopurinol 300 MG tablet Commonly known as:  ZYLOPRIM Take 300 mg by mouth daily.   aspirin EC 81 MG tablet Take 81 mg by mouth daily.   donepezil 5 MG tablet Commonly known as:  ARICEPT Take 5 mg by mouth daily.   ENSURE PLUS Liqd Take 237 mLs  by mouth 2 (two) times daily between meals.   NUTRITIONAL SUPPLEMENT PO Take by mouth. Magic cup with meals as supplement   finasteride 5 MG tablet Commonly known as:  PROSCAR Take 5 mg by mouth daily.   hyoscyamine 0.375 MG 12 hr tablet Commonly known as:  LEVBID Take 0.375 mg by mouth 3 (three) times daily. cramping   mirtazapine 7.5 MG tablet Commonly known as:  REMERON Take 7.5 mg by mouth at bedtime.   MULTIVITAMIN ADULTS PO Take 1 tablet by mouth daily.   omeprazole 20 MG capsule Commonly known as:  PRILOSEC Take 20 mg by mouth daily.   polyethylene glycol packet Commonly known as:  MIRALAX / GLYCOLAX Take 17 g by mouth daily. Hold for diarrhea   Vitamin D (Ergocalciferol) 50000 units Caps capsule Commonly known as:  DRISDOL Take 50,000 Units by mouth. Take one capsule once a month on the 7th       Meds ordered this encounter  Medications  . Nutritional Supplements (NUTRITIONAL SUPPLEMENT PO)    Sig: Take by mouth. Magic cup with meals as supplement    Immunization History  Administered Date(s) Administered  . Influenza Split 04/28/2014  . Influenza-Unspecified 03/06/2012, 02/21/2016, 03/06/2017  . PPD Test 09/08/2015, 09/26/2015    Social History   Tobacco Use  . Smoking  status: Former Smoker    Years: 6.00  . Smokeless tobacco: Never Used  Substance Use Topics  . Alcohol use: No    Review of Systems  DATA OBTAINED: from patient-very limited; nursing-no concerns GENERAL:  no fevers, fatigue, appetite changes SKIN: No itching, rash HEENT: No complaint RESPIRATORY: No cough, wheezing, SOB CARDIAC: No chest pain, palpitations, lower extremity edema  GI: No abdominal pain, No N/V/D or constipation, No heartburn or reflux  GU: No dysuria, frequency or urgency, or incontinence  MUSCULOSKELETAL: No unrelieved bone/joint pain NEUROLOGIC: No headache, dizziness  PSYCHIATRIC: No overt anxiety or sadness  Vitals:   03/20/17 1512  BP: 113/66    Pulse: 62  Resp: 20  Temp: (!) 97.2 F (36.2 C)  SpO2: 97%   Body mass index is 22.85 kg/m. Physical Exam  GENERAL APPEARANCE: Alert, conversant, No acute distress  SKIN: No diaphoresis rash HEENT: Unremarkable RESPIRATORY: Breathing is even, unlabored. Lung sounds are clear   CARDIOVASCULAR: Heart RRR no murmurs, rubs or gallops. No peripheral edema  GASTROINTESTINAL: Abdomen is soft, non-tender, not distended w/ normal bowel sounds.  GENITOURINARY: Bladder non tender, not distended  MUSCULOSKELETAL: No abnormal joints or musculature NEUROLOGIC: Cranial nerves 2-12 grossly intact. Moves all extremities PSYCHIATRIC: Mood and affect this dementia, no behavioral issues  Patient Active Problem List   Diagnosis Date Noted  . BPH (benign prostatic hyperplasia)   . Arthritis   . Hyperlipidemia, unspecified   . Cataract cortical, senile   . PVD (peripheral vascular disease) ([place])   . Actinic keratosis   . Hypertension   . Anemia of chronic disease 02/10/2016  . GERD (gastroesophageal reflux disease) 01/10/2016  . Abdominal pain 10/06/2015  . Acute encephalopathy 09/29/2015  . HTN (hypertension) 09/29/2015  . Altered mental status   . Acute respiratory failure ([place]) 09/24/2015  . Malnutrition of moderate degree 09/24/2015  . Dysphagia 09/16/2015  . Arm swelling 09/16/2015  . Gout 09/16/2015  . Pressure ulcer 08/31/2015  . BPH without obstruction/lower urinary tract symptoms 08/31/2015  . Dementia without behavioral disturbance 08/31/2015  . 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 Sepsis ([place]) 08/31/2015  . Pneumonia 08/30/2015  . CAP (community acquired pneumonia) 08/30/2015    CMP     Component Value Date/Time   NA 139 11/28/2016   K 4.2 11/28/2016   CL 107 09/25/2015 0519   CO2 28 09/25/2015 0519   GLUCOSE 94 09/25/2015 0519   BUN 16 11/28/2016   CREATININE 0.7 11/28/2016   CREATININE 0.86 09/25/2015 0519   CALCIUM 10.8 (H) 09/25/2015 0519   PROT 6.3 (L)  09/25/2015 0519   ALBUMIN 3.2 (L) 09/25/2015 0519   AST 20 11/28/2016   ALT 9 (A) 11/28/2016   ALKPHOS 108 11/28/2016   BILITOT 0.6 09/25/2015 0519   GFRNONAA >60 09/25/2015 0519   GFRAA >60 09/25/2015 0519   Recent Labs    11/28/16  NA 139  K 4.2  BUN 16  CREATININE 0.7   Recent Labs    11/28/16  AST 20  ALT 9*  ALKPHOS 108   Recent Labs    11/28/16  WBC 8.9  HGB 14.2  HCT 45  PLT 247   No results for input(s): CHOL, LDLCALC, TRIG in the last 8760 hours.  Invalid input(s): HCL No results found for: [facility] Lab Results  Component Value Date   TSH 1.43 02/13/2016   Lab Results  Component Value Date   HGBA1C 6.2 02/20/2016   Lab Results  Component Value Date   CHOL  171 02/20/2016   HDL 27 (A) 02/20/2016   LDLCALC 98 02/20/2016   TRIG 232 (A) 02/20/2016    Significant Diagnostic Results in last 30 days:  No results found.  Assessment and Plan  BPH (benign prostatic hyperplasia) Stable as long as patient continues his Proscar 5 mg by mouth daily  Gout No reported flares; plan to continue allopurinol 300 mg by mouth daily  Anemia of chronic disease Hemoglobin is excellent normal;will continue to monitor intervals     Webb Silversmith D. [name], MD

## 2017-04-05 ENCOUNTER — Encounter: Payer: Self-pay | Admitting: Internal Medicine

## 2017-04-05 NOTE — Assessment & Plan Note (Signed)
No report of aspiration;patient continues to tolerate pured diet well; continue Levbid 0.375 mg by mouth twice a day

## 2017-04-05 NOTE — Assessment & Plan Note (Signed)
o reports of reflux or aspiration; plan to continue omeprazole 20 mg by mouth daily

## 2017-04-05 NOTE — Assessment & Plan Note (Signed)
Hemoglobin is excellent normal;will continue to monitor intervals

## 2017-04-05 NOTE — Assessment & Plan Note (Signed)
No reported flares; plan to continue allopurinol 300 mg by mouth daily

## 2017-04-05 NOTE — Assessment & Plan Note (Signed)
Stable as long as patient continues his Proscar 5 mg by mouth daily

## 2017-04-05 NOTE — Assessment & Plan Note (Signed)
No wounds or lesions;continue ASA 81 mg by mouth daily

## 2017-04-22 ENCOUNTER — Non-Acute Institutional Stay (SKILLED_NURSING_FACILITY): Payer: Medicare Other | Admitting: Internal Medicine

## 2017-04-22 ENCOUNTER — Encounter: Payer: Self-pay | Admitting: Internal Medicine

## 2017-04-22 DIAGNOSIS — K224 Dyskinesia of esophagus: Secondary | ICD-10-CM

## 2017-04-22 DIAGNOSIS — I10 Essential (primary) hypertension: Secondary | ICD-10-CM | POA: Diagnosis not present

## 2017-04-22 DIAGNOSIS — G301 Alzheimer's disease with late onset: Secondary | ICD-10-CM | POA: Diagnosis not present

## 2017-04-22 DIAGNOSIS — F0280 Dementia in other diseases classified elsewhere without behavioral disturbance: Secondary | ICD-10-CM

## 2017-04-22 NOTE — Progress Notes (Signed)
Location:  [facility] Room Number: 161W Place of Service:  SNF (31)  Jack Duos, MD  Patient Care Team: Jack Duos, MD as PCP - General (Internal Medicine)  Extended Emergency Contact Information Primary Emergency Contact: Jack,Cantrell Address: [address]          [place], [place] [address] Jack Cantrell of [place] Phone: [phone] Mobile Phone: [phone] Relation: Son    Allergies: Patient has no known allergies.  Chief Complaint  Patient presents with  . Medical Management of Chronic Issues    routine visit    HPI: Patient is 83 y.o. male who is being seen for routine issues of hypertension, Nutcracker esophagus, and dementia.  Past Medical History:  Diagnosis Date  . Actinic keratosis   . Anemia of chronic disease 02/10/2016  . Arthritis   . BPH (benign prostatic hyperplasia)   . BPH without obstruction/lower urinary tract symptoms 08/31/2015  . Cancer ([place])   . Cataract cortical, senile   . Dementia without behavioral disturbance 08/31/2015  . Esophageal abnormality   . GERD (gastroesophageal reflux disease) 01/10/2016  . Gout   . Hyperlipidemia, unspecified   . Hypertension   . Malnutrition of moderate degree 09/24/2015  . 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 Pneumonia 08/30/2015  . PVD (peripheral vascular disease) ([place])     Past Surgical History:  Procedure Laterality Date  . ESOPHAGOGASTRODUODENOSCOPY N/A 09/23/2015   Procedure: ESOPHAGOGASTRODUODENOSCOPY (EGD);  Surgeon: Arta Silence, MD;  [facility];  Service: Endoscopy;  Laterality: N/A;  . ESOPHAGOGASTRODUODENOSCOPY     01/15/2009 , 10/29/2009  . ESOPHAGOGASTRODUODENOSCOPY (EGD) WITH PROPOFOL N/A 08/31/2015   Procedure: ESOPHAGOGASTRODUODENOSCOPY (EGD) WITH PROPOFOL;  Surgeon: Wonda Horner, MD;  [facility];  Service: Endoscopy;  Laterality: N/A;  . FOOT SURGERY    . JOINT REPLACEMENT     Hip  . TONSILLECTOMY      Allergies as of  04/22/2017   No Known Allergies     Medication List        Accurate as of 04/22/17 11:59 PM. Always use your most recent med list.          allopurinol 300 MG tablet Commonly known as:  ZYLOPRIM Take 300 mg by mouth daily.   aspirin EC 81 MG tablet Take 81 mg by mouth daily.   donepezil 5 MG tablet Commonly known as:  ARICEPT Take 5 mg by mouth daily.   finasteride 5 MG tablet Commonly known as:  PROSCAR Take 5 mg by mouth daily.   hyoscyamine 0.375 MG 12 hr tablet Commonly known as:  LEVBID Take 0.375 mg by mouth 3 (three) times daily. cramping   mirtazapine 7.5 MG tablet Commonly known as:  REMERON Take 7.5 mg by mouth at bedtime.   MULTIVITAMIN ADULTS PO Take 1 tablet by mouth daily.   NUTRITIONAL SUPPLEMENT PO Take by mouth. Magic cup with meals as supplement   polyethylene glycol packet Commonly known as:  MIRALAX / GLYCOLAX Take 17 g by mouth daily. Hold for diarrhea   Vitamin D (Ergocalciferol) 50000 units Caps capsule Commonly known as:  DRISDOL Take 50,000 Units by mouth. Take one capsule once a month on the 7th       No orders of the defined types were placed in this encounter.   Immunization History  Administered Date(s) Administered  . Influenza Split 04/28/2014  . Influenza-Unspecified 03/06/2012, 02/21/2016, 03/06/2017  . PPD Test 09/08/2015, 09/26/2015  . Pneumococcal Conjugate-13 04/13/2017  Social History   Tobacco Use  . Smoking status: Former Smoker    Years: 6.00  . Smokeless tobacco: Never Used  Substance Use Topics  . Alcohol use: No    Review of Systems  DATA OBTAINED: from patient-limited nursing-no new concerns; GENERAL:  no fevers, fatigue, appetite changes SKIN: No itching, rash HEENT: No complaint RESPIRATORY: No cough, wheezing, SOB CARDIAC: No chest pain, palpitations, lower extremity edema  GI: No abdominal pain, No N/V/D or constipation, No heartburn or reflux  GU: No dysuria, frequency or urgency, or  incontinence  MUSCULOSKELETAL: No unrelieved bone/joint pain NEUROLOGIC: No headache, dizziness  PSYCHIATRIC: No overt anxiety or sadness  Vitals:   04/22/17 1611  BP: 103/64  Pulse: 83  Resp: 18  Temp: (!) 97 F (36.1 C)  SpO2: 96%   Body mass index is 22.32 kg/m. Physical Exam  GENERAL APPEARANCE: Alert,  minimally conversant, No acute distress  SKIN: No diaphoresis rash HEENT: Unremarkable RESPIRATORY: Breathing is even, unlabored. Lung sounds are clear   CARDIOVASCULAR: Heart RRR, 4/6 systolic murmur, no  rubs or gallops. No peripheral edema  GASTROINTESTINAL: Abdomen is soft, non-tender, not distended w/ normal bowel sounds.  GENITOURINARY: Bladder non tender, not distended  MUSCULOSKELETAL: No abnormal joints or musculature NEUROLOGIC: Cranial nerves 2-12 grossly intact. Moves all extremities PSYCHIATRIC: Mood and affect with dementia, no behavioral issues  Patient Active Problem List   Diagnosis Date Noted  . BPH (benign prostatic hyperplasia)   . Arthritis   . Hyperlipidemia, unspecified   . Cataract cortical, senile   . PVD (peripheral vascular disease) ([place])   . Actinic keratosis   . Hypertension   . Anemia of chronic disease 02/10/2016  . GERD (gastroesophageal reflux disease) 01/10/2016  . Abdominal pain 10/06/2015  . Acute encephalopathy 09/29/2015  . HTN (hypertension) 09/29/2015  . Altered mental status   . Acute respiratory failure ([place]) 09/24/2015  . Malnutrition of moderate degree 09/24/2015  . Dysphagia 09/16/2015  . Arm swelling 09/16/2015  . Gout 09/16/2015  . Pressure ulcer 08/31/2015  . BPH without obstruction/lower urinary tract symptoms 08/31/2015  . Dementia without behavioral disturbance 08/31/2015  . 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 Sepsis ([place]) 08/31/2015  . Pneumonia 08/30/2015  . CAP (community acquired pneumonia) 08/30/2015    CMP     Component Value Date/Time   NA 139 11/28/2016   K 4.2 11/28/2016   CL 107 09/25/2015  0519   CO2 28 09/25/2015 0519   GLUCOSE 94 09/25/2015 0519   BUN 16 11/28/2016   CREATININE 0.7 11/28/2016   CREATININE 0.86 09/25/2015 0519   CALCIUM 10.8 (H) 09/25/2015 0519   PROT 6.3 (L) 09/25/2015 0519   ALBUMIN 3.2 (L) 09/25/2015 0519   AST 20 11/28/2016   ALT 9 (A) 11/28/2016   ALKPHOS 108 11/28/2016   BILITOT 0.6 09/25/2015 0519   GFRNONAA >60 09/25/2015 0519   GFRAA >60 09/25/2015 0519   Recent Labs    11/28/16  NA 139  K 4.2  BUN 16  CREATININE 0.7   Recent Labs    11/28/16  AST 20  ALT 9*  ALKPHOS 108   Recent Labs    11/28/16  WBC 8.9  HGB 14.2  HCT 45  PLT 247   No results for input(s): CHOL, LDLCALC, TRIG in the last 8760 hours.  Invalid input(s): HCL No results found for: [facility] Lab Results  Component Value Date   TSH 1.43 02/13/2016   Lab Results  Component Value Date   HGBA1C  6.2 02/20/2016   Lab Results  Component Value Date   CHOL 171 02/20/2016   HDL 27 (A) 02/20/2016   LDLCALC 98 02/20/2016   TRIG 232 (A) 02/20/2016    Significant Diagnostic Results in last 30 days:  No results found.  Assessment and Plan  Hypertension Controller medication; continue to monitor  Nutcracker esophagus Reported choking problems; will continue to monitor  Dementia without behavioral disturbance Continue stable; continue Aricept 5 mg by mouth daily    Linkyn Gobin D. [name], MD

## 2017-04-24 ENCOUNTER — Encounter: Payer: Self-pay | Admitting: Internal Medicine

## 2017-04-24 NOTE — Assessment & Plan Note (Signed)
Controller medication; continue to monitor

## 2017-04-24 NOTE — Assessment & Plan Note (Signed)
Continue stable; continue Aricept 5 mg by mouth daily

## 2017-04-24 NOTE — Assessment & Plan Note (Signed)
Reported choking problems; will continue to monitor

## 2017-05-01 LAB — BASIC METABOLIC PANEL
BUN: 19 (ref 4–21)
Creatinine: 0.6 (ref 0.6–1.3)
Glucose: 76
POTASSIUM: 4.2 (ref 3.4–5.3)
Sodium: 143 (ref 137–147)

## 2017-05-01 LAB — CBC AND DIFFERENTIAL
HCT: 44 (ref 41–53)
HEMOGLOBIN: 14.1 (ref 13.5–17.5)
PLATELETS: 201 (ref 150–399)
WBC: 8.9

## 2017-05-07 ENCOUNTER — Other Ambulatory Visit: Payer: Self-pay

## 2017-05-25 ENCOUNTER — Non-Acute Institutional Stay (SKILLED_NURSING_FACILITY): Payer: Medicare Other | Admitting: Internal Medicine

## 2017-05-25 ENCOUNTER — Encounter: Payer: Self-pay | Admitting: Internal Medicine

## 2017-05-25 DIAGNOSIS — N400 Enlarged prostate without lower urinary tract symptoms: Secondary | ICD-10-CM | POA: Diagnosis not present

## 2017-05-25 DIAGNOSIS — M1000 Idiopathic gout, unspecified site: Secondary | ICD-10-CM | POA: Diagnosis not present

## 2017-05-25 DIAGNOSIS — K219 Gastro-esophageal reflux disease without esophagitis: Secondary | ICD-10-CM | POA: Diagnosis not present

## 2017-05-25 NOTE — Progress Notes (Signed)
Location:  [facility] Room Number: 025E Place of Service:  SNF (31)  [name], MD  Patient Care Team: [name], MD as PCP - General (Internal Medicine)  Extended Emergency Contact Information Primary Emergency Contact: Petree,[name] Address: [address]          [place], [place] [address] [name] of [place] Phone: [phone] Mobile Phone: [phone] Relation: Son    Allergies: Patient has no known allergies.  Chief Complaint  Patient presents with  . Medical Management of Chronic Issues    routine visit    HPI: Patient is 83 y.o. male who is being seen for routine issues of gout, BPH, and GERD.  Past Medical History:  Diagnosis Date  . Actinic keratosis   . Anemia of chronic disease 02/10/2016  . Arthritis   . BPH (benign prostatic hyperplasia)   . BPH without obstruction/lower urinary tract symptoms 08/31/2015  . Cancer ([place])   . Cataract cortical, senile   . Dementia without behavioral disturbance 08/31/2015  . Esophageal abnormality   . GERD (gastroesophageal reflux disease) 01/10/2016  . Gout   . Hyperlipidemia, unspecified   . Hypertension   . Malnutrition of moderate degree 09/24/2015  . 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 Pneumonia 08/30/2015  . PVD (peripheral vascular disease) ([place])     Past Surgical History:  Procedure Laterality Date  . ESOPHAGOGASTRODUODENOSCOPY N/A 09/23/2015   Procedure: ESOPHAGOGASTRODUODENOSCOPY (EGD);  Surgeon: Arta Silence, MD;  [facility];  Service: Endoscopy;  Laterality: N/A;  . ESOPHAGOGASTRODUODENOSCOPY     01/15/2009 , 10/29/2009  . ESOPHAGOGASTRODUODENOSCOPY (EGD) WITH PROPOFOL N/A 08/31/2015   Procedure: ESOPHAGOGASTRODUODENOSCOPY (EGD) WITH PROPOFOL;  Surgeon: Wonda Horner, MD;  [facility];  Service: Endoscopy;  Laterality: N/A;  . FOOT SURGERY    . JOINT REPLACEMENT     Hip  . TONSILLECTOMY      Allergies as of 05/25/2017   No Known  Allergies     Medication List        Accurate as of 05/25/17 11:59 PM. Always use your most recent med list.          allopurinol 300 MG tablet Commonly known as:  ZYLOPRIM Take 300 mg by mouth daily.   aspirin EC 81 MG tablet Take 81 mg by mouth daily.   donepezil 5 MG tablet Commonly known as:  ARICEPT Take 5 mg by mouth daily.   finasteride 5 MG tablet Commonly known as:  PROSCAR Take 5 mg by mouth daily.   hyoscyamine 0.375 MG 12 hr tablet Commonly known as:  LEVBID Take 0.375 mg by mouth 3 (three) times daily. cramping   mirtazapine 7.5 MG tablet Commonly known as:  REMERON Take 7.5 mg by mouth at bedtime.   MULTIVITAMIN ADULTS PO Take 1 tablet by mouth daily.   NUTRITIONAL SUPPLEMENT PO Take by mouth. Magic cup with meals as supplement   ENSURE Take 237 mLs by mouth. Drink three times daily between meal times   polyethylene glycol packet Commonly known as:  MIRALAX / GLYCOLAX Take 17 g by mouth daily. Hold for diarrhea   Vitamin D (Ergocalciferol) 50000 units Caps capsule Commonly known as:  DRISDOL Take 50,000 Units by mouth. Take one capsule once a month on the 7th       No orders of the defined types were placed in this encounter.   Immunization History  Administered Date(s) Administered  . Influenza Split 04/28/2014  . Influenza-Unspecified 03/06/2012, 02/21/2016,  03/06/2017  . PPD Test 09/08/2015, 09/26/2015  . Pneumococcal Conjugate-13 04/13/2017    Social History   Tobacco Use  . Smoking status: Former Smoker    Years: 6.00  . Smokeless tobacco: Never Used  Substance Use Topics  . Alcohol use: No    Review of Systems  unable to obtain secondary to dementia; nursing-no concerns     Vitals:   05/25/17 1645  BP: 134/70  Pulse: 80  Resp: 20  Temp: 97.9 F (36.6 C)  SpO2: 96%   Body mass index is 22.07 kg/m. Physical Exam  GENERAL APPEARANCE: Alert,  No acute distress  SKIN: No diaphoresis rash HEENT:  Unremarkable RESPIRATORY: Breathing is even, unlabored. Lung sounds are clear   CARDIOVASCULAR: Heart  RRR no murmurs, rubs or gallops. No peripheral edema  GASTROINTESTINAL: Abdomen is soft, non-tender, not distended w/ normal bowel sounds.  GENITOURINARY: Bladder non tender, not distended  MUSCULOSKELETAL: No abnormal joints or musculature NEUROLOGIC: Cranial nerves 2-12 grossly intact. Moves all extremities PSYCHIATRIC: Mood and affect with dementia, no behavioral issues  Patient Active Problem List   Diagnosis Date Noted  . BPH (benign prostatic hyperplasia)   . Arthritis   . Hyperlipidemia, unspecified   . Cataract cortical, senile   . PVD (peripheral vascular disease) ([place])   . Actinic keratosis   . Hypertension   . Anemia of chronic disease 02/10/2016  . GERD (gastroesophageal reflux disease) 01/10/2016  . Abdominal pain 10/06/2015  . Acute encephalopathy 09/29/2015  . HTN (hypertension) 09/29/2015  . Altered mental status   . Acute respiratory failure ([place]) 09/24/2015  . Malnutrition of moderate degree 09/24/2015  . Dysphagia 09/16/2015  . Arm swelling 09/16/2015  . Gout 09/16/2015  . Pressure ulcer 08/31/2015  . BPH without obstruction/lower urinary tract symptoms 08/31/2015  . Dementia without behavioral disturbance 08/31/2015  . 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 Sepsis ([place]) 08/31/2015  . Pneumonia 08/30/2015  . CAP (community acquired pneumonia) 08/30/2015    CMP     Component Value Date/Time   NA 143 05/01/2017   K 4.2 05/01/2017   CL 107 09/25/2015 0519   CO2 28 09/25/2015 0519   GLUCOSE 94 09/25/2015 0519   BUN 19 05/01/2017   CREATININE 0.6 05/01/2017   CREATININE 0.86 09/25/2015 0519   CALCIUM 10.8 (H) 09/25/2015 0519   PROT 6.3 (L) 09/25/2015 0519   ALBUMIN 3.2 (L) 09/25/2015 0519   AST 20 11/28/2016   ALT 9 (A) 11/28/2016   ALKPHOS 108 11/28/2016   BILITOT 0.6 09/25/2015 0519   GFRNONAA >60 09/25/2015 0519   GFRAA >60 09/25/2015 0519    Recent Labs    11/28/16 05/01/17  NA 139 143  K 4.2 4.2  BUN 16 19  CREATININE 0.7 0.6   Recent Labs    11/28/16  AST 20  ALT 9*  ALKPHOS 108   Recent Labs    11/28/16 05/01/17  WBC 8.9 8.9  HGB 14.2 14.1  HCT 45 44  PLT 247 201   No results for input(s): CHOL, LDLCALC, TRIG in the last 8760 hours.  Invalid input(s): HCL No results found for: [facility] Lab Results  Component Value Date   TSH 1.43 02/13/2016   Lab Results  Component Value Date   HGBA1C 6.2 02/20/2016   Lab Results  Component Value Date   CHOL 171 02/20/2016   HDL 27 (A) 02/20/2016   LDLCALC 98 02/20/2016   TRIG 232 (A) 02/20/2016    Significant Diagnostic Results in last 30 days:  No results found.  Assessment and Plan  Gout Continues without reported flares; plan to continue allopurinol 300 mg by mouth daily at bedtime  BPH (benign prostatic hyperplasia) Stable; plan to continue Proscar 5 mg by mouth daily  GERD (gastroesophageal reflux disease) No reports of reflux; patient is on no medications at this time and doing well; we'll continue supportive care    Jowan Skillin D. [name], MD

## 2017-05-31 ENCOUNTER — Encounter: Payer: Self-pay | Admitting: Internal Medicine

## 2017-05-31 NOTE — Assessment & Plan Note (Signed)
Stable; plan to continue Proscar 5 mg by mouth daily

## 2017-05-31 NOTE — Assessment & Plan Note (Signed)
No reports of reflux; patient is on no medications at this time and doing well; we'll continue supportive care

## 2017-05-31 NOTE — Assessment & Plan Note (Signed)
Continues without reported flares; plan to continue allopurinol 300 mg by mouth daily at bedtime

## 2017-06-24 ENCOUNTER — Non-Acute Institutional Stay (SKILLED_NURSING_FACILITY): Payer: Medicare Other | Admitting: Internal Medicine

## 2017-06-24 ENCOUNTER — Encounter: Payer: Self-pay | Admitting: Internal Medicine

## 2017-06-24 DIAGNOSIS — R1319 Other dysphagia: Secondary | ICD-10-CM

## 2017-06-24 DIAGNOSIS — I739 Peripheral vascular disease, unspecified: Secondary | ICD-10-CM | POA: Diagnosis not present

## 2017-06-24 DIAGNOSIS — D638 Anemia in other chronic diseases classified elsewhere: Secondary | ICD-10-CM | POA: Diagnosis not present

## 2017-06-24 NOTE — Progress Notes (Signed)
Location:  [facility] Room Number: 169C Place of Service:  SNF (31)  [name], MD  Patient Care Team: [name], MD as PCP - General (Internal Medicine)  Extended Emergency Contact Information Primary Emergency Contact: Haycraft,[name] Address: [address]          [place], [place] [address] [name] of [place] Phone: [phone] Mobile Phone: [phone] Relation: Son    Allergies: Patient has no known allergies.  Chief Complaint  Patient presents with  . Medical Management of Chronic Issues    HPI: Patient is 84 y.o. male who is being seen for routine issues of peripheral vascular disease, dysphagia, and anemia  Past Medical History:  Diagnosis Date  . Actinic keratosis   . Anemia of chronic disease 02/10/2016  . Arthritis   . BPH (benign prostatic hyperplasia)   . BPH without obstruction/lower urinary tract symptoms 08/31/2015  . Cancer ([place])   . Cataract cortical, senile   . Dementia without behavioral disturbance 08/31/2015  . Esophageal abnormality   . GERD (gastroesophageal reflux disease) 01/10/2016  . Gout   . Hyperlipidemia, unspecified   . Hypertension   . Malnutrition of moderate degree 09/24/2015  . 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 Pneumonia 08/30/2015  . PVD (peripheral vascular disease) ([place])     Past Surgical History:  Procedure Laterality Date  . ESOPHAGOGASTRODUODENOSCOPY N/A 09/23/2015   Procedure: ESOPHAGOGASTRODUODENOSCOPY (EGD);  Surgeon: Arta Silence, MD;  [facility];  Service: Endoscopy;  Laterality: N/A;  . ESOPHAGOGASTRODUODENOSCOPY     01/15/2009 , 10/29/2009  . ESOPHAGOGASTRODUODENOSCOPY (EGD) WITH PROPOFOL N/A 08/31/2015   Procedure: ESOPHAGOGASTRODUODENOSCOPY (EGD) WITH PROPOFOL;  Surgeon: Wonda Horner, MD;  [facility];  Service: Endoscopy;  Laterality: N/A;  . FOOT SURGERY    . JOINT REPLACEMENT     Hip  . TONSILLECTOMY      Allergies as of 06/24/2017   No  Known Allergies     Medication List        Accurate as of 06/24/17 11:59 PM. Always use your most recent med list.          allopurinol 300 MG tablet Commonly known as:  ZYLOPRIM Take 300 mg by mouth daily.   aspirin EC 81 MG tablet Take 81 mg by mouth daily.   donepezil 5 MG tablet Commonly known as:  ARICEPT Take 5 mg by mouth daily.   finasteride 5 MG tablet Commonly known as:  PROSCAR Take 5 mg by mouth daily.   mirtazapine 7.5 MG tablet Commonly known as:  REMERON Take 7.5 mg by mouth at bedtime.   MULTIVITAMIN ADULTS PO Take 1 tablet by mouth daily.   NUTRITIONAL SUPPLEMENT PO Take by mouth. Magic cup with meals as supplement   ENSURE Take 237 mLs by mouth. Drink three times daily between meal times   polyethylene glycol packet Commonly known as:  MIRALAX / GLYCOLAX Take 17 g by mouth daily. Hold for diarrhea   Vitamin D (Ergocalciferol) 50000 units Caps capsule Commonly known as:  DRISDOL Take 50,000 Units by mouth. Take one capsule once a month on the 7th       No orders of the defined types were placed in this encounter.   Immunization History  Administered Date(s) Administered  . Influenza Split 04/28/2014  . Influenza-Unspecified 03/06/2012, 02/21/2016, 03/06/2017  . PPD Test 09/08/2015, 09/26/2015  . Pneumococcal Conjugate-13 04/13/2017    Social History   Tobacco Use  . Smoking status: Former  Smoker    Years: 6.00  . Smokeless tobacco: Never Used  Substance Use Topics  . Alcohol use: No    Review of Systems  DATA OBTAINED: from patient-limited; nursing-no acute concerns GENERAL:  no fevers, fatigue, appetite changes SKIN: No itching, rash HEENT: No complaint RESPIRATORY: No cough, wheezing, SOB CARDIAC: No chest pain, palpitations, lower extremity edema  GI: No abdominal pain, No N/V/D or constipation, No heartburn or reflux  GU: No dysuria, frequency or urgency, or incontinence  MUSCULOSKELETAL: No unrelieved bone/joint  pain NEUROLOGIC: No headache, dizziness  PSYCHIATRIC: No overt anxiety or sadness  Vitals:   06/24/17 1448  BP: 132/68  Pulse: 76  Resp: 20  Temp: 98 F (36.7 C)  SpO2: 94%   Body mass index is 21.51 kg/m. Physical Exam  GENERAL APPEARANCE: Alert,  No acute distress  SKIN: No diaphoresis rash HEENT: Unremarkable RESPIRATORY: Breathing is even, unlabored. Lung sounds are clear   CARDIOVASCULAR: Heart RRR no murmurs, rubs or gallops. No peripheral edema  GASTROINTESTINAL: Abdomen is soft, non-tender, not distended w/ normal bowel sounds.  GENITOURINARY: Bladder non tender, not distended  MUSCULOSKELETAL: No abnormal joints or musculature NEUROLOGIC: Cranial nerves 2-12 grossly intact. Moves all extremities PSYCHIATRIC: Mood and affect with dementia, no behavioral issues  Patient Active Problem List   Diagnosis Date Noted  . BPH (benign prostatic hyperplasia)   . Arthritis   . Hyperlipidemia, unspecified   . Cataract cortical, senile   . PVD (peripheral vascular disease) ([place])   . Actinic keratosis   . Hypertension   . Anemia of chronic disease 02/10/2016  . GERD (gastroesophageal reflux disease) 01/10/2016  . Abdominal pain 10/06/2015  . Acute encephalopathy 09/29/2015  . HTN (hypertension) 09/29/2015  . Altered mental status   . Acute respiratory failure ([place]) 09/24/2015  . Malnutrition of moderate degree 09/24/2015  . Dysphagia 09/16/2015  . Arm swelling 09/16/2015  . Gout 09/16/2015  . Pressure ulcer 08/31/2015  . BPH without obstruction/lower urinary tract symptoms 08/31/2015  . Dementia without behavioral disturbance 08/31/2015  . 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 Sepsis ([place]) 08/31/2015  . Pneumonia 08/30/2015  . CAP (community acquired pneumonia) 08/30/2015    CMP     Component Value Date/Time   NA 143 05/01/2017   K 4.2 05/01/2017   CL 107 09/25/2015 0519   CO2 28 09/25/2015 0519   GLUCOSE 94 09/25/2015 0519   BUN 19 05/01/2017   CREATININE 0.6  05/01/2017   CREATININE 0.86 09/25/2015 0519   CALCIUM 10.8 (H) 09/25/2015 0519   PROT 6.3 (L) 09/25/2015 0519   ALBUMIN 3.2 (L) 09/25/2015 0519   AST 20 11/28/2016   ALT 9 (A) 11/28/2016   ALKPHOS 108 11/28/2016   BILITOT 0.6 09/25/2015 0519   GFRNONAA >60 09/25/2015 0519   GFRAA >60 09/25/2015 0519   Recent Labs    11/28/16 05/01/17  NA 139 143  K 4.2 4.2  BUN 16 19  CREATININE 0.7 0.6   Recent Labs    11/28/16  AST 20  ALT 9*  ALKPHOS 108   Recent Labs    11/28/16 05/01/17  WBC 8.9 8.9  HGB 14.2 14.1  HCT 45 44  PLT 247 201   No results for input(s): CHOL, LDLCALC, TRIG in the last 8760 hours.  Invalid input(s): HCL No results found for: [facility] Lab Results  Component Value Date   TSH 1.43 02/13/2016   Lab Results  Component Value Date   HGBA1C 6.2 02/20/2016   Lab Results  Component Value Date   CHOL 171 02/20/2016   HDL 27 (A) 02/20/2016   LDLCALC 98 02/20/2016   TRIG 232 (A) 02/20/2016    Significant Diagnostic Results in last 30 days:  No results found.  Assessment and Plan  PVD (peripheral vascular disease) ([place]) The wounds or lesions; continue ASA 81 mg daily  Dysphagia No reports of aspiration; continued pured diet  Anemia of chronic disease Hemoglobin excellent at 14.3; we'll monitor intervals    Kealey Kemmer D. [name], MD

## 2017-06-28 ENCOUNTER — Encounter: Payer: Self-pay | Admitting: Internal Medicine

## 2017-06-28 NOTE — Assessment & Plan Note (Signed)
Hemoglobin excellent at 14.3; we'll monitor intervals

## 2017-06-28 NOTE — Assessment & Plan Note (Signed)
The wounds or lesions; continue ASA 81 mg daily

## 2017-06-28 NOTE — Assessment & Plan Note (Signed)
No reports of aspiration; continued pured diet

## 2017-07-22 LAB — BASIC METABOLIC PANEL
BUN: 22 — AB (ref 4–21)
CREATININE: 0.8 (ref 0.6–1.3)
Glucose: 95
Potassium: 4 (ref 3.4–5.3)
Sodium: 145 (ref 137–147)

## 2017-07-22 LAB — CBC AND DIFFERENTIAL
HEMATOCRIT: 37 — AB (ref 41–53)
HEMOGLOBIN: 12.3 — AB (ref 13.5–17.5)
PLATELETS: 157 (ref 150–399)
WBC: 12.6

## 2017-07-23 ENCOUNTER — Non-Acute Institutional Stay (SKILLED_NURSING_FACILITY): Payer: Medicare Other | Admitting: Internal Medicine

## 2017-07-23 DIAGNOSIS — I10 Essential (primary) hypertension: Secondary | ICD-10-CM

## 2017-07-23 DIAGNOSIS — K224 Dyskinesia of esophagus: Secondary | ICD-10-CM

## 2017-07-23 DIAGNOSIS — F0280 Dementia in other diseases classified elsewhere without behavioral disturbance: Secondary | ICD-10-CM | POA: Diagnosis not present

## 2017-07-23 DIAGNOSIS — G301 Alzheimer's disease with late onset: Secondary | ICD-10-CM | POA: Diagnosis not present

## 2017-07-26 ENCOUNTER — Encounter: Payer: Self-pay | Admitting: Internal Medicine

## 2017-07-26 NOTE — Assessment & Plan Note (Signed)
Controlled on no medications;monitor at intervals

## 2017-07-26 NOTE — Progress Notes (Signed)
Location:  [place] of Service:  SNF (31)  [name], MD  Patient Care Team: [name], MD as PCP - General (Internal Medicine)  Extended Emergency Contact Information Primary Emergency Contact: Pickar,[name] Address: [address]          [place], [place] [address] [name] of [place] Phone: [phone] Mobile Phone: [phone] Relation: Son    Allergies: Patient has no known allergies.  Chief Complaint  Patient presents with  . Medical Management of Chronic Issues    HPI: Patient is 84 y.o. male who is being seen for routine issues of hypertension, Nutcracker esophagus, and dementia.  Past Medical History:  Diagnosis Date  . Actinic keratosis   . Anemia of chronic disease 02/10/2016  . Arthritis   . BPH (benign prostatic hyperplasia)   . BPH without obstruction/lower urinary tract symptoms 08/31/2015  . Cancer ([place])   . Cataract cortical, senile   . Dementia without behavioral disturbance 08/31/2015  . Esophageal abnormality   . GERD (gastroesophageal reflux disease) 01/10/2016  . Gout   . Hyperlipidemia, unspecified   . Hypertension   . Malnutrition of moderate degree 09/24/2015  . 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 Pneumonia 08/30/2015  . PVD (peripheral vascular disease) ([place])     Past Surgical History:  Procedure Laterality Date  . ESOPHAGOGASTRODUODENOSCOPY N/A 09/23/2015   Procedure: ESOPHAGOGASTRODUODENOSCOPY (EGD);  Surgeon: Arta Silence, MD;  [facility];  Service: Endoscopy;  Laterality: N/A;  . ESOPHAGOGASTRODUODENOSCOPY     01/15/2009 , 10/29/2009  . ESOPHAGOGASTRODUODENOSCOPY (EGD) WITH PROPOFOL N/A 08/31/2015   Procedure: ESOPHAGOGASTRODUODENOSCOPY (EGD) WITH PROPOFOL;  Surgeon: Wonda Horner, MD;  [facility];  Service: Endoscopy;  Laterality: N/A;  . FOOT SURGERY    . JOINT REPLACEMENT     Hip  . TONSILLECTOMY      Allergies as of 07/23/2017   No Known Allergies       Medication List        Accurate as of 07/23/17 11:59 PM. Always use your most recent med list.          allopurinol 300 MG tablet Commonly known as:  ZYLOPRIM Take 300 mg by mouth daily.   aspirin EC 81 MG tablet Take 81 mg by mouth daily.   donepezil 5 MG tablet Commonly known as:  ARICEPT Take 5 mg by mouth daily.   finasteride 5 MG tablet Commonly known as:  PROSCAR Take 5 mg by mouth daily.   mirtazapine 7.5 MG tablet Commonly known as:  REMERON Take 7.5 mg by mouth at bedtime.   MULTIVITAMIN ADULTS PO Take 1 tablet by mouth daily.   NUTRITIONAL SUPPLEMENT PO Take by mouth. Magic cup with meals as supplement   ENSURE Take 237 mLs by mouth. Drink three times daily between meal times   polyethylene glycol packet Commonly known as:  MIRALAX / GLYCOLAX Take 17 g by mouth daily. Hold for diarrhea   Vitamin D (Ergocalciferol) 50000 units Caps capsule Commonly known as:  DRISDOL Take 50,000 Units by mouth. Take one capsule once a month on the 7th       No orders of the defined types were placed in this encounter.   Immunization History  Administered Date(s) Administered  . Influenza Split 04/28/2014  . Influenza-Unspecified 03/06/2012, 02/21/2016, 03/06/2017  . PPD Test 09/08/2015, 09/26/2015  . Pneumococcal Conjugate-13 04/13/2017    Social History   Tobacco Use  . Smoking status: Former Smoker  Years: 6.00  . Smokeless tobacco: Never Used  Substance Use Topics  . Alcohol use: No    Review of Systems  DATA OBTAINED: from nurse GENERAL:  no fevers, fatigue, appetite changes SKIN: No itching, rash HEENT: No complaint RESPIRATORY: No cough, wheezing, SOB CARDIAC: No chest pain, palpitations, lower extremity edema  GI: No abdominal pain, No N/V/D or constipation, No heartburn or reflux  GU: No dysuria, frequency or urgency, or incontinence  MUSCULOSKELETAL: No unrelieved bone/joint pain NEUROLOGIC: No headache, dizziness  PSYCHIATRIC: No  overt anxiety or sadness  Vitals:   07/26/17 1522  BP: 122/70  Pulse: 80  Resp: 18  Temp: (!) 97.1 F (36.2 C)   There is no height or weight on file to calculate BMI. Physical Exam  GENERAL APPEARANCE: Alert,  No acute distress  SKIN: No diaphoresis rash HEENT: Unremarkable RESPIRATORY: Breathing is even, unlabored. Lung sounds are clear   CARDIOVASCULAR: Heart RRR no murmurs, rubs or gallops. No peripheral edema  GASTROINTESTINAL: Abdomen is soft, non-tender, not distended w/ normal bowel sounds.  GENITOURINARY: Bladder non tender, not distended  MUSCULOSKELETAL: No abnormal joints or musculature NEUROLOGIC: Cranial nerves 2-12 grossly intact. Moves all extremities PSYCHIATRIC: Mood and affect with dementia, no behavioral issues  Patient Active Problem List   Diagnosis Date Noted  . BPH (benign prostatic hyperplasia)   . Arthritis   . Hyperlipidemia, unspecified   . Cataract cortical, senile   . PVD (peripheral vascular disease) ([place])   . Actinic keratosis   . Hypertension   . Anemia of chronic disease 02/10/2016  . GERD (gastroesophageal reflux disease) 01/10/2016  . Abdominal pain 10/06/2015  . Acute encephalopathy 09/29/2015  . HTN (hypertension) 09/29/2015  . Altered mental status   . Acute respiratory failure ([place]) 09/24/2015  . Malnutrition of moderate degree 09/24/2015  . Dysphagia 09/16/2015  . Arm swelling 09/16/2015  . Gout 09/16/2015  . Pressure ulcer 08/31/2015  . BPH without obstruction/lower urinary tract symptoms 08/31/2015  . Dementia without behavioral disturbance 08/31/2015  . 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 Sepsis ([place]) 08/31/2015  . Pneumonia 08/30/2015  . CAP (community acquired pneumonia) 08/30/2015    CMP     Component Value Date/Time   NA 143 05/01/2017   K 4.2 05/01/2017   CL 107 09/25/2015 0519   CO2 28 09/25/2015 0519   GLUCOSE 94 09/25/2015 0519   BUN 19 05/01/2017   CREATININE 0.6 05/01/2017   CREATININE 0.86 09/25/2015  0519   CALCIUM 10.8 (H) 09/25/2015 0519   PROT 6.3 (L) 09/25/2015 0519   ALBUMIN 3.2 (L) 09/25/2015 0519   AST 20 11/28/2016   ALT 9 (A) 11/28/2016   ALKPHOS 108 11/28/2016   BILITOT 0.6 09/25/2015 0519   GFRNONAA >60 09/25/2015 0519   GFRAA >60 09/25/2015 0519   Recent Labs    11/28/16 05/01/17  NA 139 143  K 4.2 4.2  BUN 16 19  CREATININE 0.7 0.6   Recent Labs    11/28/16  AST 20  ALT 9*  ALKPHOS 108   Recent Labs    11/28/16 05/01/17  WBC 8.9 8.9  HGB 14.2 14.1  HCT 45 44  PLT 247 201   No results for input(s): CHOL, LDLCALC, TRIG in the last 8760 hours.  Invalid input(s): HCL No results found for: [facility] Lab Results  Component Value Date   TSH 1.43 02/13/2016   Lab Results  Component Value Date   HGBA1C 6.2 02/20/2016   Lab Results  Component Value Date  CHOL 171 02/20/2016   HDL 27 (A) 02/20/2016   LDLCALC 98 02/20/2016   TRIG 232 (A) 02/20/2016    Significant Diagnostic Results in last 30 days:  No results found.  Assessment and Plan  Hypertension Controlled on no medications;monitor at intervals  Nutcracker esophagus No choking problems; in case patient is probably not a candidate for intervention  Dementia without behavioral disturbance Patient appears very stable; continue Aricept 5 mg nightly     Inocencio Homes, MD

## 2017-07-26 NOTE — Assessment & Plan Note (Signed)
Patient appears very stable; continue Aricept 5 mg nightly

## 2017-07-26 NOTE — Assessment & Plan Note (Signed)
No choking problems; in case patient is probably not a candidate for intervention

## 2017-08-18 ENCOUNTER — Non-Acute Institutional Stay (SKILLED_NURSING_FACILITY): Payer: Medicare Other | Admitting: Internal Medicine

## 2017-08-18 ENCOUNTER — Encounter: Payer: Self-pay | Admitting: Internal Medicine

## 2017-08-18 DIAGNOSIS — K219 Gastro-esophageal reflux disease without esophagitis: Secondary | ICD-10-CM | POA: Diagnosis not present

## 2017-08-18 DIAGNOSIS — N400 Enlarged prostate without lower urinary tract symptoms: Secondary | ICD-10-CM | POA: Diagnosis not present

## 2017-08-18 DIAGNOSIS — M1000 Idiopathic gout, unspecified site: Secondary | ICD-10-CM

## 2017-08-18 NOTE — Progress Notes (Signed)
Location:  [place] Room Number: 361W Place of Service:  SNF [phone])  Jack Duos, MD  Patient Care Team: Jack Duos, MD as PCP - General (Internal Medicine)  Extended Emergency Contact Information Primary Emergency Contact: Jack Cantrell Address: [address]          [place], [place] [address] [name] of [place] Phone: [phone] Mobile Phone: [phone] Relation: Son    Allergies: Patient has no known allergies.  Chief Complaint  Patient presents with  . Medical Management of Chronic Issues    Routine Visit    HPI: Patient is 84 y.o. male who is being seen for routine issues of BPH, GERD, and gout.  Past Medical History:  Diagnosis Date  . Actinic keratosis   . Anemia of chronic disease 02/10/2016  . Arthritis   . BPH (benign prostatic hyperplasia)   . BPH without obstruction/lower urinary tract symptoms 08/31/2015  . Cancer ([place])   . Cataract cortical, senile   . Dementia without behavioral disturbance 08/31/2015  . Esophageal abnormality   . GERD (gastroesophageal reflux disease) 01/10/2016  . Gout   . Hyperlipidemia, unspecified   . Hypertension   . Malnutrition of moderate degree 09/24/2015  . 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 Pneumonia 08/30/2015  . PVD (peripheral vascular disease) ([place])     Past Surgical History:  Procedure Laterality Date  . ESOPHAGOGASTRODUODENOSCOPY N/A 09/23/2015   Procedure: ESOPHAGOGASTRODUODENOSCOPY (EGD);  Surgeon: Arta Silence, MD;  [facility];  Service: Endoscopy;  Laterality: N/A;  . ESOPHAGOGASTRODUODENOSCOPY     01/15/2009 , 10/29/2009  . ESOPHAGOGASTRODUODENOSCOPY (EGD) WITH PROPOFOL N/A 08/31/2015   Procedure: ESOPHAGOGASTRODUODENOSCOPY (EGD) WITH PROPOFOL;  Surgeon: Wonda Horner, MD;  [facility];  Service: Endoscopy;  Laterality: N/A;  . FOOT SURGERY    . JOINT REPLACEMENT     Hip  . TONSILLECTOMY      Allergies as of 08/18/2017   No Known  Allergies     Medication List        Accurate as of 08/18/17 11:59 PM. Always use your most recent med list.          allopurinol 300 MG tablet Commonly known as:  ZYLOPRIM Take 300 mg by mouth daily.   aspirin EC 81 MG tablet Take 81 mg by mouth daily.   donepezil 5 MG tablet Commonly known as:  ARICEPT Take 5 mg by mouth daily.   finasteride 5 MG tablet Commonly known as:  PROSCAR Take 5 mg by mouth daily.   mirtazapine 7.5 MG tablet Commonly known as:  REMERON Take 7.5 mg by mouth at bedtime.   MULTIVITAMIN ADULTS PO Take 1 tablet by mouth daily.   NUTRITIONAL SUPPLEMENT PO Take by mouth. Initiate 4oz Medpass QID ( if resident does not like may resume Ensure)   NUTRITIONAL SUPPLEMENTS PO Magic cup with meals as supplement   polyethylene glycol packet Commonly known as:  MIRALAX / GLYCOLAX Take 17 g by mouth daily. Hold for diarrhea   Vitamin D (Ergocalciferol) 50000 units Caps capsule Commonly known as:  DRISDOL Take 50,000 Units by mouth every 30 (thirty) days. on the 7th       No orders of the defined types were placed in this encounter.   Immunization History  Administered Date(s) Administered  . Influenza Split 04/28/2014  . Influenza-Unspecified 03/06/2012, 02/21/2016, 03/06/2017  . PPD Test 09/08/2015, 09/26/2015  . Pneumococcal Conjugate-13 04/13/2017    Social History   Tobacco Use  .  Smoking status: Former Smoker    Years: 6.00  . Smokeless tobacco: Never Used  Substance Use Topics  . Alcohol use: No    Review of Systems unable to obtain secondary to dementia; nursing- no acute concerns     Vitals:   08/18/17 1207  BP: 124/67  Pulse: 75  Resp: 18  Temp: 98.1 F (36.7 C)  SpO2: 95%   Body mass index is 19.77 kg/m. Physical Exam  GENERAL APPEARANCE: Alert, minimally conversant, No acute distress  SKIN: No diaphoresis rash HEENT: Unremarkable RESPIRATORY: Breathing is even, unlabored. Lung sounds are clear     CARDIOVASCULAR: Heart RRR no murmurs, rubs or gallops. No peripheral edema  GASTROINTESTINAL: Abdomen is soft, non-tender, not distended w/ normal bowel sounds.  GENITOURINARY: Bladder non tender, not distended  MUSCULOSKELETAL: No abnormal joints or musculature NEUROLOGIC: Cranial nerves 2-12 grossly intact. Moves all extremities PSYCHIATRIC: Mood and affect dementia, no behavioral issues  Patient Active Problem List   Diagnosis Date Noted  . BPH (benign prostatic hyperplasia)   . Arthritis   . Hyperlipidemia, unspecified   . Cataract cortical, senile   . PVD (peripheral vascular disease) ([place])   . Actinic keratosis   . Hypertension   . Anemia of chronic disease 02/10/2016  . GERD (gastroesophageal reflux disease) 01/10/2016  . Abdominal pain 10/06/2015  . Acute encephalopathy 09/29/2015  . HTN (hypertension) 09/29/2015  . Altered mental status   . Acute respiratory failure ([place]) 09/24/2015  . Malnutrition of moderate degree 09/24/2015  . Dysphagia 09/16/2015  . Arm swelling 09/16/2015  . Gout 09/16/2015  . Pressure ulcer 08/31/2015  . BPH without obstruction/lower urinary tract symptoms 08/31/2015  . Dementia without behavioral disturbance 08/31/2015  . 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 Sepsis ([place]) 08/31/2015  . Pneumonia 08/30/2015  . CAP (community acquired pneumonia) 08/30/2015    CMP     Component Value Date/Time   NA 145 07/22/2017   K 4.0 07/22/2017   CL 107 09/25/2015 0519   CO2 28 09/25/2015 0519   GLUCOSE 94 09/25/2015 0519   BUN 22 (A) 07/22/2017   CREATININE 0.8 07/22/2017   CREATININE 0.86 09/25/2015 0519   CALCIUM 10.8 (H) 09/25/2015 0519   PROT 6.3 (L) 09/25/2015 0519   ALBUMIN 3.2 (L) 09/25/2015 0519   AST 20 11/28/2016   ALT 9 (A) 11/28/2016   ALKPHOS 108 11/28/2016   BILITOT 0.6 09/25/2015 0519   GFRNONAA >60 09/25/2015 0519   GFRAA >60 09/25/2015 0519   Recent Labs    11/28/16 05/01/17 07/22/17  NA 139 143 145  K 4.2 4.2 4.0  BUN 16  19 22*  CREATININE 0.7 0.6 0.8   Recent Labs    11/28/16  AST 20  ALT 9*  ALKPHOS 108   Recent Labs    11/28/16 05/01/17 07/22/17  WBC 8.9 8.9 12.6  HGB 14.2 14.1 12.3*  HCT 45 44 37*  PLT 247 201 157   No results for input(s): CHOL, LDLCALC, TRIG in the last 8760 hours.  Invalid input(s): HCL No results found for: [facility] Lab Results  Component Value Date   TSH 1.43 02/13/2016   Lab Results  Component Value Date   HGBA1C 6.2 02/20/2016   Lab Results  Component Value Date   CHOL 171 02/20/2016   HDL 27 (A) 02/20/2016   LDLCALC 98 02/20/2016   TRIG 232 (A) 02/20/2016    Significant Diagnostic Results in last 30 days:  No results found.  Assessment and Plan  BPH (benign  prostatic hyperplasia) No infections or problems; continue Proscar 5 mg daily  GERD (gastroesophageal reflux disease) No reports of reflux; patient is on no medication; continue to monitor  Gout No flares; continue allopurinol 300 mg nightly     Uriel Dowding D. [name], MD

## 2017-08-22 ENCOUNTER — Encounter: Payer: Self-pay | Admitting: Internal Medicine

## 2017-08-22 NOTE — Assessment & Plan Note (Signed)
No reports of reflux; patient is on no medication; continue to monitor

## 2017-08-22 NOTE — Assessment & Plan Note (Signed)
No infections or problems; continue Proscar 5 mg daily

## 2017-08-22 NOTE — Assessment & Plan Note (Signed)
No flares; continue allopurinol 300 mg nightly

## 2017-09-22 ENCOUNTER — Encounter: Payer: Self-pay | Admitting: Internal Medicine

## 2017-09-22 ENCOUNTER — Non-Acute Institutional Stay (SKILLED_NURSING_FACILITY): Payer: Medicare Other | Admitting: Internal Medicine

## 2017-09-22 DIAGNOSIS — F0280 Dementia in other diseases classified elsewhere without behavioral disturbance: Secondary | ICD-10-CM | POA: Diagnosis not present

## 2017-09-22 DIAGNOSIS — M1000 Idiopathic gout, unspecified site: Secondary | ICD-10-CM

## 2017-09-22 DIAGNOSIS — G301 Alzheimer's disease with late onset: Secondary | ICD-10-CM

## 2017-09-22 DIAGNOSIS — I739 Peripheral vascular disease, unspecified: Secondary | ICD-10-CM

## 2017-09-22 NOTE — Progress Notes (Signed)
Location:  [place] Room Number: 169C Place of Service:  SNF [phone])  [name], MD  Patient Care Team: [name], MD as PCP - General (Internal Medicine)  Extended Emergency Contact Information Primary Emergency Contact: Piltz,[name] Address: [address]          [place], [place] [address] [name] of [place] Phone: [phone] Mobile Phone: [phone] Relation: Son    Allergies: Patient has no known allergies.  Chief Complaint  Patient presents with  . Medical Management of Chronic Issues    Routine Visit    HPI: Patient is 84 y.o. male who is being seen for routine issues of peripheral vascular disease, gout, and dementia.  Past Medical History:  Diagnosis Date  . Actinic keratosis   . Anemia of chronic disease 02/10/2016  . Arthritis   . BPH (benign prostatic hyperplasia)   . BPH without obstruction/lower urinary tract symptoms 08/31/2015  . Cancer ([place])   . Cataract cortical, senile   . Dementia without behavioral disturbance 08/31/2015  . Esophageal abnormality   . GERD (gastroesophageal reflux disease) 01/10/2016  . Gout   . Hyperlipidemia, unspecified   . Hypertension   . Malnutrition of moderate degree 09/24/2015  . 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 Pneumonia 08/30/2015  . PVD (peripheral vascular disease) ([place])     Past Surgical History:  Procedure Laterality Date  . ESOPHAGOGASTRODUODENOSCOPY N/A 09/23/2015   Procedure: ESOPHAGOGASTRODUODENOSCOPY (EGD);  Surgeon: Arta Silence, MD;  [facility];  Service: Endoscopy;  Laterality: N/A;  . ESOPHAGOGASTRODUODENOSCOPY     01/15/2009 , 10/29/2009  . ESOPHAGOGASTRODUODENOSCOPY (EGD) WITH PROPOFOL N/A 08/31/2015   Procedure: ESOPHAGOGASTRODUODENOSCOPY (EGD) WITH PROPOFOL;  Surgeon: Wonda Horner, MD;  [facility];  Service: Endoscopy;  Laterality: N/A;  . FOOT SURGERY    . JOINT REPLACEMENT     Hip  . TONSILLECTOMY      Allergies as of  09/22/2017   No Known Allergies     Medication List        Accurate as of 09/22/17 11:59 PM. Always use your most recent med list.          allopurinol 300 MG tablet Commonly known as:  ZYLOPRIM Take 300 mg by mouth daily.   aspirin EC 81 MG tablet Take 81 mg by mouth daily.   donepezil 5 MG tablet Commonly known as:  ARICEPT Take 5 mg by mouth daily.   finasteride 5 MG tablet Commonly known as:  PROSCAR Take 5 mg by mouth daily.   mirtazapine 7.5 MG tablet Commonly known as:  REMERON Take 7.5 mg by mouth at bedtime.   MULTIVITAMIN ADULTS PO Take 1 tablet by mouth daily.   NUTRITIONAL SUPPLEMENT PO Take by mouth. Initiate 4oz Medpass QID ( if resident does not like may resume Ensure)   NUTRITIONAL SUPPLEMENTS PO Magic cup with meals as supplement   polyethylene glycol packet Commonly known as:  MIRALAX / GLYCOLAX Take 17 g by mouth daily. Hold for diarrhea   Vitamin D (Ergocalciferol) 50000 units Caps capsule Commonly known as:  DRISDOL Take 50,000 Units by mouth every 30 (thirty) days. on the 7th       No orders of the defined types were placed in this encounter.   Immunization History  Administered Date(s) Administered  . Influenza Split 04/28/2014  . Influenza-Unspecified 03/06/2012, 02/21/2016, 03/06/2017  . PPD Test 09/08/2015, 09/26/2015  . Pneumococcal Conjugate-13 04/13/2017    Social History   Tobacco  Use  . Smoking status: Former Smoker    Years: 6.00  . Smokeless tobacco: Never Used  Substance Use Topics  . Alcohol use: No    Review of Systems  DATA OBTAINED: from patient-very limited; nursing- no acute concerns GENERAL:  no fevers, fatigue, appetite changes SKIN: No itching, rash HEENT: No complaint RESPIRATORY: No cough, wheezing, SOB CARDIAC: No chest pain, palpitations, lower extremity edema  GI: No abdominal pain, No N/V/D or constipation, No heartburn or reflux  GU: No dysuria, frequency or urgency, or incontinence    MUSCULOSKELETAL: No unrelieved bone/joint pain NEUROLOGIC: No headache, dizziness  PSYCHIATRIC: No overt anxiety or sadness  Vitals:   09/22/17 0911  BP: 124/70  Pulse: 80  Resp: 16  Temp: (!) 97.2 F (36.2 C)  SpO2: 95%   Body mass index is 18.88 kg/m. Physical Exam  GENERAL APPEARANCE: Alert,  No acute distress  SKIN: No diaphoresis rash HEENT: Unremarkable RESPIRATORY: Breathing is even, unlabored. Lung sounds are clear   CARDIOVASCULAR: Heart RRR no murmurs, rubs or gallops. No peripheral edema  GASTROINTESTINAL: Abdomen is soft, non-tender, not distended w/ normal bowel sounds.  GENITOURINARY: Bladder non tender, not distended  MUSCULOSKELETAL: No abnormal joints or musculature NEUROLOGIC: Cranial nerves 2-12 grossly intact. Moves all extremities PSYCHIATRIC: Mood and affect dementia, no behavioral issues  Patient Active Problem List   Diagnosis Date Noted  . BPH (benign prostatic hyperplasia)   . Arthritis   . Hyperlipidemia, unspecified   . Cataract cortical, senile   . PVD (peripheral vascular disease) ([place])   . Actinic keratosis   . Hypertension   . Anemia of chronic disease 02/10/2016  . GERD (gastroesophageal reflux disease) 01/10/2016  . Abdominal pain 10/06/2015  . Acute encephalopathy 09/29/2015  . HTN (hypertension) 09/29/2015  . Altered mental status   . Acute respiratory failure ([place]) 09/24/2015  . Malnutrition of moderate degree 09/24/2015  . Dysphagia 09/16/2015  . Arm swelling 09/16/2015  . Gout 09/16/2015  . Pressure ulcer 08/31/2015  . BPH without obstruction/lower urinary tract symptoms 08/31/2015  . Dementia without behavioral disturbance 08/31/2015  . 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 Sepsis ([place]) 08/31/2015  . Pneumonia 08/30/2015  . CAP (community acquired pneumonia) 08/30/2015    CMP     Component Value Date/Time   NA 145 07/22/2017   K 4.0 07/22/2017   CL 107 09/25/2015 0519   CO2 28 09/25/2015 0519   GLUCOSE 94  09/25/2015 0519   BUN 22 (A) 07/22/2017   CREATININE 0.8 07/22/2017   CREATININE 0.86 09/25/2015 0519   CALCIUM 10.8 (H) 09/25/2015 0519   PROT 6.3 (L) 09/25/2015 0519   ALBUMIN 3.2 (L) 09/25/2015 0519   AST 20 11/28/2016   ALT 9 (A) 11/28/2016   ALKPHOS 108 11/28/2016   BILITOT 0.6 09/25/2015 0519   GFRNONAA >60 09/25/2015 0519   GFRAA >60 09/25/2015 0519   Recent Labs    11/28/16 05/01/17 07/22/17  NA 139 143 145  K 4.2 4.2 4.0  BUN 16 19 22*  CREATININE 0.7 0.6 0.8   Recent Labs    11/28/16  AST 20  ALT 9*  ALKPHOS 108   Recent Labs    11/28/16 05/01/17 07/22/17  WBC 8.9 8.9 12.6  HGB 14.2 14.1 12.3*  HCT 45 44 37*  PLT 247 201 157   No results for input(s): CHOL, LDLCALC, TRIG in the last 8760 hours.  Invalid input(s): HCL No results found for: [facility] Lab Results  Component Value Date   TSH  1.43 02/13/2016   Lab Results  Component Value Date   HGBA1C 6.2 02/20/2016   Lab Results  Component Value Date   CHOL 171 02/20/2016   HDL 27 (A) 02/20/2016   LDLCALC 98 02/20/2016   TRIG 232 (A) 02/20/2016    Significant Diagnostic Results in last 30 days:  No results found.  Assessment and Plan  PVD (peripheral vascular disease) ([place]) Lesions; continue ASA 81 mg daily  Gout No reported flares; continue allopurinol 300 mg p.o. daily  Dementia without behavioral disturbance Slight decline since prior; continue Aricept 5 mg daily    Wisdom Seybold D. [name], MD

## 2017-10-17 ENCOUNTER — Encounter: Payer: Self-pay | Admitting: Internal Medicine

## 2017-10-17 NOTE — Assessment & Plan Note (Signed)
Slight decline since prior; continue Aricept 5 mg daily

## 2017-10-17 NOTE — Assessment & Plan Note (Signed)
No reported flares; continue allopurinol 300 mg p.o. daily

## 2017-10-17 NOTE — Assessment & Plan Note (Signed)
Lesions; continue ASA 81 mg daily

## 2017-10-21 ENCOUNTER — Encounter: Payer: Self-pay | Admitting: Internal Medicine

## 2017-10-21 ENCOUNTER — Non-Acute Institutional Stay (SKILLED_NURSING_FACILITY): Payer: Medicare Other | Admitting: Internal Medicine

## 2017-10-21 DIAGNOSIS — K224 Dyskinesia of esophagus: Secondary | ICD-10-CM | POA: Diagnosis not present

## 2017-10-21 DIAGNOSIS — R1319 Other dysphagia: Secondary | ICD-10-CM | POA: Diagnosis not present

## 2017-10-21 DIAGNOSIS — I10 Essential (primary) hypertension: Secondary | ICD-10-CM | POA: Diagnosis not present

## 2017-10-21 NOTE — Progress Notes (Signed)
Location:  [facility] Room Number: 027O Place of Service:  SNF [phone])  [name], MD  Patient Care Team: [name], MD as PCP - General (Internal Medicine)  Extended Emergency Contact Information Primary Emergency Contact: Bergemann,[name] Address: [address]          [place], [place] [address] [name] of [place] Phone: [phone] Mobile Phone: [phone] Relation: Son    Allergies: Patient has no known allergies.  Chief Complaint  Patient presents with  . Medical Management of Chronic Issues    Routine Visit    HPI: Patient is 84 y.o. male who is being seen for routine issues of nutcracker esophagus, GERD, and hypertension.  Past Medical History:  Diagnosis Date  . Actinic keratosis   . Anemia of chronic disease 02/10/2016  . Arthritis   . BPH (benign prostatic hyperplasia)   . BPH without obstruction/lower urinary tract symptoms 08/31/2015  . Cancer ([place])   . Cataract cortical, senile   . Dementia without behavioral disturbance 08/31/2015  . Esophageal abnormality   . GERD (gastroesophageal reflux disease) 01/10/2016  . Gout   . Hyperlipidemia, unspecified   . Hypertension   . Malnutrition of moderate degree 09/24/2015  . 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 Pneumonia 08/30/2015  . PVD (peripheral vascular disease) ([place])     Past Surgical History:  Procedure Laterality Date  . ESOPHAGOGASTRODUODENOSCOPY N/A 09/23/2015   Procedure: ESOPHAGOGASTRODUODENOSCOPY (EGD);  Surgeon: Arta Silence, MD;  [facility];  Service: Endoscopy;  Laterality: N/A;  . ESOPHAGOGASTRODUODENOSCOPY     01/15/2009 , 10/29/2009  . ESOPHAGOGASTRODUODENOSCOPY (EGD) WITH PROPOFOL N/A 08/31/2015   Procedure: ESOPHAGOGASTRODUODENOSCOPY (EGD) WITH PROPOFOL;  Surgeon: Wonda Horner, MD;  [facility];  Service: Endoscopy;  Laterality: N/A;  . FOOT SURGERY    . JOINT REPLACEMENT     Hip  . TONSILLECTOMY      Allergies as of  10/21/2017   No Known Allergies     Medication List        Accurate as of 10/21/17 11:59 PM. Always use your most recent med list.          acetaminophen 325 MG tablet Commonly known as:  TYLENOL Take 650 mg by mouth every 12 (twelve) hours.   allopurinol 300 MG tablet Commonly known as:  ZYLOPRIM Take 300 mg by mouth daily.   aspirin EC 81 MG tablet Take 81 mg by mouth daily.   donepezil 5 MG tablet Commonly known as:  ARICEPT Take 5 mg by mouth daily.   finasteride 5 MG tablet Commonly known as:  PROSCAR Take 5 mg by mouth daily.   mirtazapine 7.5 MG tablet Commonly known as:  REMERON Take 7.5 mg by mouth at bedtime.   MULTIVITAMIN ADULTS PO Take 1 tablet by mouth daily.   NUTRITIONAL SUPPLEMENT PO Take by mouth. Initiate 4oz Medpass QID ( if resident does not like may resume Ensure)   NUTRITIONAL SUPPLEMENTS PO Magic cup with meals as supplement   polyethylene glycol packet Commonly known as:  MIRALAX / GLYCOLAX Take 17 g by mouth daily. Hold for diarrhea   Vitamin D (Ergocalciferol) 50000 units Caps capsule Commonly known as:  DRISDOL Take 50,000 Units by mouth every 30 (thirty) days. on the 7th       No orders of the defined types were placed in this encounter.   Immunization History  Administered Date(s) Administered  . Influenza Split 04/28/2014  . Influenza-Unspecified 03/06/2012, 02/21/2016, 03/06/2017  .  PPD Test 09/08/2015, 09/26/2015  . Pneumococcal Conjugate-13 04/13/2017    Social History   Tobacco Use  . Smoking status: Former Smoker    Years: 6.00  . Smokeless tobacco: Never Used  Substance Use Topics  . Alcohol use: No    Review of Systems  DATA OBTAINED: from patient-very limited; nursing no acute concerns GENERAL:  no fevers, fatigue, appetite changes SKIN: No itching, rash HEENT: No complaint RESPIRATORY: No cough, wheezing, SOB CARDIAC: No chest pain, palpitations, lower extremity edema  GI: No abdominal pain, No  N/V/D or constipation, No heartburn or reflux  GU: No dysuria, frequency or urgency, or incontinence  MUSCULOSKELETAL: No unrelieved bone/joint pain NEUROLOGIC: No headache, dizziness  PSYCHIATRIC: No overt anxiety or sadness  Vitals:   10/21/17 1414  BP: 120/75  Pulse: 74  Resp: 18  Temp: (!) 96 F (35.6 C)  SpO2: 96%   Body mass index is 18.78 kg/m. Physical Exam  GENERAL APPEARANCE: Alert, No acute distress  SKIN: No diaphoresis rash HEENT: Unremarkable RESPIRATORY: Breathing is even, unlabored. Lung sounds are clear   CARDIOVASCULAR: Heart RRR no murmurs, rubs or gallops. No peripheral edema  GASTROINTESTINAL: Abdomen is soft, non-tender, not distended w/ normal bowel sounds.  GENITOURINARY: Bladder non tender, not distended  MUSCULOSKELETAL: No abnormal joints or musculature NEUROLOGIC: Cranial nerves 2-12 grossly intact. Moves all extremities PSYCHIATRIC: Mood and affect with dementia, no behavioral issues  Patient Active Problem List   Diagnosis Date Noted  . BPH (benign prostatic hyperplasia)   . Arthritis   . Hyperlipidemia, unspecified   . Cataract cortical, senile   . PVD (peripheral vascular disease) ([place])   . Actinic keratosis   . Hypertension   . Anemia of chronic disease 02/10/2016  . GERD (gastroesophageal reflux disease) 01/10/2016  . Abdominal pain 10/06/2015  . Acute encephalopathy 09/29/2015  . HTN (hypertension) 09/29/2015  . Altered mental status   . Acute respiratory failure ([place]) 09/24/2015  . Malnutrition of moderate degree 09/24/2015  . Dysphagia 09/16/2015  . Arm swelling 09/16/2015  . Gout 09/16/2015  . Pressure ulcer 08/31/2015  . BPH without obstruction/lower urinary tract symptoms 08/31/2015  . Dementia without behavioral disturbance 08/31/2015  . 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 Sepsis ([place]) 08/31/2015  . Pneumonia 08/30/2015  . CAP (community acquired pneumonia) 08/30/2015    CMP     Component Value Date/Time   NA 145  07/22/2017   K 4.0 07/22/2017   CL 107 09/25/2015 0519   CO2 28 09/25/2015 0519   GLUCOSE 94 09/25/2015 0519   BUN 22 (A) 07/22/2017   CREATININE 0.8 07/22/2017   CREATININE 0.86 09/25/2015 0519   CALCIUM 10.8 (H) 09/25/2015 0519   PROT 6.3 (L) 09/25/2015 0519   ALBUMIN 3.2 (L) 09/25/2015 0519   AST 20 11/28/2016   ALT 9 (A) 11/28/2016   ALKPHOS 108 11/28/2016   BILITOT 0.6 09/25/2015 0519   GFRNONAA >60 09/25/2015 0519   GFRAA >60 09/25/2015 0519   Recent Labs    11/28/16 05/01/17 07/22/17  NA 139 143 145  K 4.2 4.2 4.0  BUN 16 19 22*  CREATININE 0.7 0.6 0.8   Recent Labs    11/28/16  AST 20  ALT 9*  ALKPHOS 108   Recent Labs    11/28/16 05/01/17 07/22/17  WBC 8.9 8.9 12.6  HGB 14.2 14.1 12.3*  HCT 45 44 37*  PLT 247 201 157   No results for input(s): CHOL, LDLCALC, TRIG in the last 8760 hours.  Invalid  input(s): HCL No results found for: [facility] Lab Results  Component Value Date   TSH 1.43 02/13/2016   Lab Results  Component Value Date   HGBA1C 6.2 02/20/2016   Lab Results  Component Value Date   CHOL 171 02/20/2016   HDL 27 (A) 02/20/2016   LDLCALC 98 02/20/2016   TRIG 232 (A) 02/20/2016    Significant Diagnostic Results in last 30 days:  No results found.  Assessment and Plan  Nutcracker esophagus No episodes of choking or reflux; patient is not a candidate for intervention; continue pured diet and support  Dysphagia Patient continues on pured diet with no episodes of reflux  Hypertension Controlled on no meds; blood pressures monitored monthly     Shakya [place] D. [name], MD

## 2017-11-16 ENCOUNTER — Non-Acute Institutional Stay (SKILLED_NURSING_FACILITY): Payer: Medicare Other | Admitting: Internal Medicine

## 2017-11-16 ENCOUNTER — Encounter: Payer: Self-pay | Admitting: Internal Medicine

## 2017-11-16 DIAGNOSIS — N400 Enlarged prostate without lower urinary tract symptoms: Secondary | ICD-10-CM | POA: Diagnosis not present

## 2017-11-16 DIAGNOSIS — E559 Vitamin D deficiency, unspecified: Secondary | ICD-10-CM

## 2017-11-16 DIAGNOSIS — F5101 Primary insomnia: Secondary | ICD-10-CM | POA: Diagnosis not present

## 2017-11-16 NOTE — Progress Notes (Signed)
Location:  [facility] Room Number: [place]:  SNF [phone])  [name], MD  Patient Care Team: [name], MD as PCP - General (Internal Medicine)  Extended Emergency Contact Information Primary Emergency Contact: Rasco,[name] Address: [address]          [place], [place] [address] Jack Cantrell of [place] Phone: [phone] Mobile Phone: [phone] Relation: Son    Allergies: Patient has no known allergies.  Chief Complaint  Patient presents with  . Medical Management of Chronic Issues    HPI: Patient is 84 y.o. male who is being seen for routine images as BPH, vitamin D deficiency, and insomnia.  Past Medical History:  Diagnosis Date  . Actinic keratosis   . Anemia of chronic disease 02/10/2016  . Arthritis   . BPH (benign prostatic hyperplasia)   . BPH without obstruction/lower urinary tract symptoms 08/31/2015  . Cancer ([place])   . Cataract cortical, senile   . Dementia without behavioral disturbance 08/31/2015  . Esophageal abnormality   . GERD (gastroesophageal reflux disease) 01/10/2016  . Gout   . Hyperlipidemia, unspecified   . Hypertension   . Malnutrition of moderate degree 09/24/2015  . 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 Pneumonia 08/30/2015  . PVD (peripheral vascular disease) ([place])     Past Surgical History:  Procedure Laterality Date  . ESOPHAGOGASTRODUODENOSCOPY N/A 09/23/2015   Procedure: ESOPHAGOGASTRODUODENOSCOPY (EGD);  Surgeon: Arta Silence, MD;  [facility];  Service: Endoscopy;  Laterality: N/A;  . ESOPHAGOGASTRODUODENOSCOPY     01/15/2009 , 10/29/2009  . ESOPHAGOGASTRODUODENOSCOPY (EGD) WITH PROPOFOL N/A 08/31/2015   Procedure: ESOPHAGOGASTRODUODENOSCOPY (EGD) WITH PROPOFOL;  Surgeon: Wonda Horner, MD;  [facility];  Service: Endoscopy;  Laterality: N/A;  . FOOT SURGERY    . JOINT REPLACEMENT     Hip  . TONSILLECTOMY      Allergies as of 11/16/2017   No Known  Allergies     Medication List        Accurate as of 11/16/17 11:59 PM. Always use your most recent med list.          acetaminophen 325 MG tablet Commonly known as:  TYLENOL Take 650 mg by mouth every 12 (twelve) hours.   allopurinol 300 MG tablet Commonly known as:  ZYLOPRIM Take 300 mg by mouth daily.   aspirin EC 81 MG tablet Take 81 mg by mouth daily.   donepezil 5 MG tablet Commonly known as:  ARICEPT Take 5 mg by mouth daily.   finasteride 5 MG tablet Commonly known as:  PROSCAR Take 5 mg by mouth daily.   Melatonin 3 MG Tabs Take 1 tablet by mouth at bedtime.   mirtazapine 7.5 MG tablet Commonly known as:  REMERON Take 7.5 mg by mouth at bedtime.   MULTIVITAMIN ADULTS PO Take 1 tablet by mouth daily.   NUTRITIONAL SUPPLEMENT PO Take by mouth. Initiate 4oz Medpass QID ( if resident does not like may resume Ensure)   NUTRITIONAL SUPPLEMENTS PO Magic cup with meals as supplement   polyethylene glycol packet Commonly known as:  MIRALAX / GLYCOLAX Take 17 g by mouth daily. Hold for diarrhea   SYSTANE 0.4-0.3 % Soln Generic drug:  Polyethyl Glycol-Propyl Glycol Apply 1 drop to eye 3 (three) times daily.   Vitamin D (Ergocalciferol) 50000 units Caps capsule Commonly known as:  DRISDOL Take 50,000 Units by mouth every 30 (thirty) days. on the 7th  No orders of the defined types were placed in this encounter.   Immunization History  Administered Date(s) Administered  . Influenza Split 04/28/2014  . Influenza-Unspecified 03/06/2012, 02/21/2016, 03/06/2017  . PPD Test 09/08/2015, 09/26/2015  . Pneumococcal Conjugate-13 04/13/2017    Social History   Tobacco Use  . Smoking status: Former Smoker    Years: 6.00  . Smokeless tobacco: Never Used  Substance Use Topics  . Alcohol use: No    Review of Systems  DATA OBTAINED: from patient-very limited; nursing-no acute concerns GENERAL:  no fevers, fatigue, appetite changes SKIN: No  itching, rash HEENT: No complaint RESPIRATORY: No cough, wheezing, SOB CARDIAC: No chest pain, palpitations, lower extremity edema  GI: No abdominal pain, No N/V/D or constipation, No heartburn or reflux  GU: No dysuria, frequency or urgency, or incontinence  MUSCULOSKELETAL: No unrelieved bone/joint pain NEUROLOGIC: No headache, dizziness  PSYCHIATRIC: No overt anxiety or sadness  Vitals:   11/16/17 1356  BP: (!) 156/88  Pulse: 88  Resp: 18  Temp: (!) 97.1 F (36.2 C)  SpO2: 95%   Body mass index is 19.27 kg/m. Physical Exam  GENERAL APPEARANCE: Alert,  No acute distress  SKIN: No diaphoresis rash HEENT: Unremarkable RESPIRATORY: Breathing is even, unlabored. Lung sounds are clear   CARDIOVASCULAR: Heart RRR no murmurs, rubs or gallops. No peripheral edema  GASTROINTESTINAL: Abdomen is soft, non-tender, not distended w/ normal bowel sounds.  GENITOURINARY: Bladder non tender, not distended  MUSCULOSKELETAL: No abnormal joints or musculature NEUROLOGIC: Cranial nerves 2-12 grossly intact. Moves all extremities PSYCHIATRIC: Dementia, no behavioral issues  Patient Active Problem List   Diagnosis Date Noted  . Vitamin D deficiency 11/26/2017  . Insomnia 11/26/2017  . BPH (benign prostatic hyperplasia)   . Arthritis   . Hyperlipidemia, unspecified   . Cataract cortical, senile   . PVD (peripheral vascular disease) ([place])   . Actinic keratosis   . Hypertension   . Anemia of chronic disease 02/10/2016  . GERD (gastroesophageal reflux disease) 01/10/2016  . Abdominal pain 10/06/2015  . Acute encephalopathy 09/29/2015  . HTN (hypertension) 09/29/2015  . Altered mental status   . Acute respiratory failure ([place]) 09/24/2015  . Malnutrition of moderate degree 09/24/2015  . Dysphagia 09/16/2015  . Arm swelling 09/16/2015  . Gout 09/16/2015  . Pressure ulcer 08/31/2015  . BPH without obstruction/lower urinary tract symptoms 08/31/2015  . Dementia without behavioral  disturbance 08/31/2015  . 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 Sepsis ([place]) 08/31/2015  . Pneumonia 08/30/2015  . CAP (community acquired pneumonia) 08/30/2015    CMP     Component Value Date/Time   NA 145 07/22/2017   K 4.0 07/22/2017   CL 107 09/25/2015 0519   CO2 28 09/25/2015 0519   GLUCOSE 94 09/25/2015 0519   BUN 22 (A) 07/22/2017   CREATININE 0.8 07/22/2017   CREATININE 0.86 09/25/2015 0519   CALCIUM 10.8 (H) 09/25/2015 0519   PROT 6.3 (L) 09/25/2015 0519   ALBUMIN 3.2 (L) 09/25/2015 0519   AST 20 11/28/2016   ALT 9 (A) 11/28/2016   ALKPHOS 108 11/28/2016   BILITOT 0.6 09/25/2015 0519   GFRNONAA >60 09/25/2015 0519   GFRAA >60 09/25/2015 0519   Recent Labs    11/28/16 05/01/17 07/22/17  NA 139 143 145  K 4.2 4.2 4.0  BUN 16 19 22*  CREATININE 0.7 0.6 0.8   Recent Labs    11/28/16  AST 20  ALT 9*  ALKPHOS 108   Recent Labs  11/28/16 05/01/17 07/22/17  WBC 8.9 8.9 12.6  HGB 14.2 14.1 12.3*  HCT 45 44 37*  PLT 247 201 157   No results for input(s): CHOL, LDLCALC, TRIG in the last 8760 hours.  Invalid input(s): HCL No results found for: [facility] Lab Results  Component Value Date   TSH 1.43 02/13/2016   Lab Results  Component Value Date   HGBA1C 6.2 02/20/2016   Lab Results  Component Value Date   CHOL 171 02/20/2016   HDL 27 (A) 02/20/2016   LDLCALC 98 02/20/2016   TRIG 232 (A) 02/20/2016    Significant Diagnostic Results in last 30 days:  No results found.  Assessment and Plan  BPH without obstruction/lower urinary tract symptoms No infections reported; continue Proscar 5 mg daily  Vitamin D deficiency Newly diagnosed; being treated with replacement 50,000 units weekly  Insomnia Appears to be stable; continue melatonin 3 mg nightly   Labs/tests ordered:    Jack Delaine. [name], MD

## 2017-11-17 ENCOUNTER — Encounter: Payer: Self-pay | Admitting: Internal Medicine

## 2017-11-17 NOTE — Assessment & Plan Note (Signed)
Patient continues on pured diet with no episodes of reflux

## 2017-11-17 NOTE — Assessment & Plan Note (Signed)
Controlled on no meds; blood pressures monitored monthly

## 2017-11-17 NOTE — Assessment & Plan Note (Signed)
No episodes of choking or reflux; patient is not a candidate for intervention; continue pured diet and support

## 2017-11-26 ENCOUNTER — Encounter: Payer: Self-pay | Admitting: Internal Medicine

## 2017-11-26 DIAGNOSIS — G4700 Insomnia, unspecified: Secondary | ICD-10-CM | POA: Insufficient documentation

## 2017-11-26 DIAGNOSIS — E559 Vitamin D deficiency, unspecified: Secondary | ICD-10-CM | POA: Insufficient documentation

## 2017-11-26 NOTE — Assessment & Plan Note (Signed)
No infections reported; continue Proscar 5 mg daily

## 2017-11-26 NOTE — Assessment & Plan Note (Signed)
Newly diagnosed; being treated with replacement 50,000 units weekly

## 2017-11-26 NOTE — Assessment & Plan Note (Signed)
Appears to be stable; continue melatonin 3 mg nightly

## 2017-12-09 ENCOUNTER — Encounter: Payer: Self-pay | Admitting: Internal Medicine

## 2017-12-09 ENCOUNTER — Non-Acute Institutional Stay (SKILLED_NURSING_FACILITY): Payer: Medicare Other | Admitting: Internal Medicine

## 2017-12-09 DIAGNOSIS — F0280 Dementia in other diseases classified elsewhere without behavioral disturbance: Secondary | ICD-10-CM

## 2017-12-09 DIAGNOSIS — G301 Alzheimer's disease with late onset: Secondary | ICD-10-CM | POA: Diagnosis not present

## 2017-12-09 DIAGNOSIS — M1000 Idiopathic gout, unspecified site: Secondary | ICD-10-CM

## 2017-12-09 DIAGNOSIS — N400 Enlarged prostate without lower urinary tract symptoms: Secondary | ICD-10-CM

## 2017-12-09 NOTE — Progress Notes (Signed)
Location:  [facility] Room Number: 235T Place of Service:  SNF (31)  [name], MD  Patient Care Team: [name], MD as PCP - General (Internal Medicine)  Extended Emergency Contact Information Primary Emergency Contact: Ourada,[name] Address: [address]          [place], [place] [address] [name] of [place] Phone: [phone] Mobile Phone: [phone] Relation: Son    Allergies: Patient has no known allergies.  Chief Complaint  Patient presents with  . Medical Management of Chronic Issues    Routine Visit    HPI: Patient is 84 y.o. male who is being seen for routine issues of dementia, gout, and BPH.  Past Medical History:  Diagnosis Date  . Actinic keratosis   . Anemia of chronic disease 02/10/2016  . Arthritis   . BPH (benign prostatic hyperplasia)   . BPH without obstruction/lower urinary tract symptoms 08/31/2015  . Cancer ([place])   . Cataract cortical, senile   . Dementia without behavioral disturbance 08/31/2015  . Esophageal abnormality   . GERD (gastroesophageal reflux disease) 01/10/2016  . Gout   . Hyperlipidemia, unspecified   . Hypertension   . Malnutrition of moderate degree 09/24/2015  . 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 Pneumonia 08/30/2015  . PVD (peripheral vascular disease) ([place])     Past Surgical History:  Procedure Laterality Date  . ESOPHAGOGASTRODUODENOSCOPY N/A 09/23/2015   Procedure: ESOPHAGOGASTRODUODENOSCOPY (EGD);  Surgeon: Arta Silence, MD;  [facility];  Service: Endoscopy;  Laterality: N/A;  . ESOPHAGOGASTRODUODENOSCOPY     01/15/2009 , 10/29/2009  . ESOPHAGOGASTRODUODENOSCOPY (EGD) WITH PROPOFOL N/A 08/31/2015   Procedure: ESOPHAGOGASTRODUODENOSCOPY (EGD) WITH PROPOFOL;  Surgeon: Wonda Horner, MD;  [facility];  Service: Endoscopy;  Laterality: N/A;  . FOOT SURGERY    . JOINT REPLACEMENT     Hip  . TONSILLECTOMY      Allergies as of 12/09/2017   No Known  Allergies     Medication List        Accurate as of 12/09/17 11:59 PM. Always use your most recent med list.          acetaminophen 325 MG tablet Commonly known as:  TYLENOL Take 650 mg by mouth every 12 (twelve) hours.   allopurinol 300 MG tablet Commonly known as:  ZYLOPRIM Take 300 mg by mouth daily.   aspirin EC 81 MG tablet Take 81 mg by mouth daily.   donepezil 5 MG tablet Commonly known as:  ARICEPT Take 5 mg by mouth daily.   finasteride 5 MG tablet Commonly known as:  PROSCAR Take 5 mg by mouth daily.   ipratropium-albuterol 0.5-2.5 (3) MG/3ML Soln Commonly known as:  DUONEB Take 3 mLs by nebulization. Inhale 1 vial via Neb twice a day for congestion   Melatonin 3 MG Tabs Take 1 tablet by mouth at bedtime.   mirtazapine 7.5 MG tablet Commonly known as:  REMERON Take 7.5 mg by mouth at bedtime.   MULTIVITAMIN ADULTS PO Take 1 tablet by mouth daily.   NUTRITIONAL SUPPLEMENT PO Take by mouth. Initiate 4oz Medpass QID ( if resident does not like may resume Ensure)   NUTRITIONAL SUPPLEMENTS PO Magic cup with meals as supplement   polyethylene glycol packet Commonly known as:  MIRALAX / GLYCOLAX Take 17 g by mouth daily. Hold for diarrhea   SYSTANE 0.4-0.3 % Soln Generic drug:  Polyethyl Glycol-Propyl Glycol Apply 1 drop to eye 3 (three) times daily.  Vitamin D (Ergocalciferol) 50000 units Caps capsule Commonly known as:  DRISDOL Take 50,000 Units by mouth every 30 (thirty) days. on the 7th       No orders of the defined types were placed in this encounter.   Immunization History  Administered Date(s) Administered  . Influenza Split 04/28/2014  . Influenza-Unspecified 03/06/2012, 02/21/2016, 03/06/2017  . PPD Test 09/08/2015, 09/26/2015  . Pneumococcal Conjugate-13 04/13/2017    Social History   Tobacco Use  . Smoking status: Former Smoker    Years: 6.00  . Smokeless tobacco: Never Used  Substance Use Topics  . Alcohol use: No     Review of Systems  DATA OBTAINED: from patient-and limited; nursing-no acute concerns GENERAL:  no fevers, fatigue, appetite changes SKIN: No itching, rash HEENT: No complaint RESPIRATORY: No cough, wheezing, SOB CARDIAC: No chest pain, palpitations, lower extremity edema  GI: No abdominal pain, No N/V/D or constipation, No heartburn or reflux  GU: No dysuria, frequency or urgency, or incontinence  MUSCULOSKELETAL: No unrelieved bone/joint pain NEUROLOGIC: No headache, dizziness  PSYCHIATRIC: No overt anxiety or sadness  Vitals:   12/09/17 1111  BP: (!) 106/54  Pulse: 63  Resp: 18  Temp: (!) 97.2 F (36.2 C)   Body mass index is 17.89 kg/m. Physical Exam  GENERAL APPEARANCE: Alert, moderately conversant, No acute distress  SKIN: No diaphoresis rash HEENT: Unremarkable RESPIRATORY: Breathing is even, unlabored. Lung sounds are clear   CARDIOVASCULAR: Heart RRR no murmurs, rubs or gallops. No peripheral edema  GASTROINTESTINAL: Abdomen is soft, non-tender, not distended w/ normal bowel sounds.  GENITOURINARY: Bladder non tender, not distended  MUSCULOSKELETAL: No abnormal joints or musculature NEUROLOGIC: Cranial nerves 2-12 grossly intact. Moves all extremities PSYCHIATRIC: Mood and affect with dementia, no behavioral issues  Patient Active Problem List   Diagnosis Date Noted  . Vitamin D deficiency 11/26/2017  . Insomnia 11/26/2017  . BPH (benign prostatic hyperplasia)   . Arthritis   . Hyperlipidemia, unspecified   . Cataract cortical, senile   . PVD (peripheral vascular disease) ([place])   . Actinic keratosis   . Hypertension   . Anemia of chronic disease 02/10/2016  . GERD (gastroesophageal reflux disease) 01/10/2016  . Abdominal pain 10/06/2015  . Acute encephalopathy 09/29/2015  . HTN (hypertension) 09/29/2015  . Altered mental status   . Acute respiratory failure ([place]) 09/24/2015  . Malnutrition of moderate degree 09/24/2015  . Dysphagia 09/16/2015  .  Arm swelling 09/16/2015  . Gout 09/16/2015  . Pressure ulcer 08/31/2015  . BPH without obstruction/lower urinary tract symptoms 08/31/2015  . Dementia without behavioral disturbance 08/31/2015  . 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 Sepsis ([place]) 08/31/2015  . Pneumonia 08/30/2015  . CAP (community acquired pneumonia) 08/30/2015    CMP     Component Value Date/Time   NA 145 07/22/2017   K 4.0 07/22/2017   CL 107 09/25/2015 0519   CO2 28 09/25/2015 0519   GLUCOSE 94 09/25/2015 0519   BUN 22 (A) 07/22/2017   CREATININE 0.8 07/22/2017   CREATININE 0.86 09/25/2015 0519   CALCIUM 10.8 (H) 09/25/2015 0519   PROT 6.3 (L) 09/25/2015 0519   ALBUMIN 3.2 (L) 09/25/2015 0519   AST 20 11/28/2016   ALT 9 (A) 11/28/2016   ALKPHOS 108 11/28/2016   BILITOT 0.6 09/25/2015 0519   GFRNONAA >60 09/25/2015 0519   GFRAA >60 09/25/2015 0519   Recent Labs    05/01/17 07/22/17  NA 143 145  K 4.2 4.0  BUN 19 22*  CREATININE 0.6 0.8   No results for input(s): AST, ALT, ALKPHOS, BILITOT, PROT, ALBUMIN in the last 8760 hours. Recent Labs    05/01/17 07/22/17  WBC 8.9 12.6  HGB 14.1 12.3*  HCT 44 37*  PLT 201 157   No results for input(s): CHOL, LDLCALC, TRIG in the last 8760 hours.  Invalid input(s): HCL No results found for: [facility] Lab Results  Component Value Date   TSH 1.43 02/13/2016   Lab Results  Component Value Date   HGBA1C 6.2 02/20/2016   Lab Results  Component Value Date   CHOL 171 02/20/2016   HDL 27 (A) 02/20/2016   LDLCALC 98 02/20/2016   TRIG 232 (A) 02/20/2016    Significant Diagnostic Results in last 30 days:  No results found.  Assessment and Plan  Dementia without behavioral disturbance Chronic and stable; continue Aricept 5 mg daily  Gout Continues without flares; continue allopurinol 300 mg daily  BPH (benign prostatic hyperplasia) No recent problems or infection; continue Proscar 5 mg daily    Anne D. [name], MD

## 2017-12-16 ENCOUNTER — Non-Acute Institutional Stay (SKILLED_NURSING_FACILITY): Payer: Medicare Other

## 2017-12-16 DIAGNOSIS — Z0000 Encounter for general adult medical examination without abnormal findings: Secondary | ICD-10-CM | POA: Diagnosis not present

## 2017-12-16 NOTE — Progress Notes (Signed)
Subjective:   Jack Cantrell is a 84 y.o. male who presents for Medicare Annual/Subsequent preventive examination at [facility]  Last AWV-12/17/2016    Objective:    Vitals: BP (!) 120/56 (BP Location: Left Arm, Patient Position: Supine)   Pulse 65   Temp 98 F (36.7 C) (Oral)   Ht 5\' 3"  (1.6 m)   Wt 101 lb (45.8 kg)   BMI 17.89 kg/m   Body mass index is 17.89 kg/m.  Advanced Directives 12/16/2017 11/16/2017 10/21/2017 09/22/2017 08/18/2017 06/24/2017 05/25/2017  Does Patient Have a Medical Advance Directive? Yes Yes Yes Yes Yes Yes Yes  Type of Advance Directive Out of facility DNR (pink MOST or yellow form) Out of facility DNR (pink MOST or yellow form) Out of facility DNR (pink MOST or yellow form) Out of facility DNR (pink MOST or yellow form) Out of facility DNR (pink MOST or yellow form) Out of facility DNR (pink MOST or yellow form) Out of facility DNR (pink MOST or yellow form)  Does patient want to make changes to medical advance directive? No - Patient declined - No - Patient declined No - Patient declined No - Patient declined - -  Copy of Vivian in [place]  Would patient like information on creating a medical advance directive? - - - - - - -  Pre-existing out of facility DNR order (yellow form or pink MOST form) Yellow form placed in chart (order not valid for inpatient use) Yellow form placed in chart (order not valid for inpatient use) Yellow form placed in chart (order not valid for inpatient use) Yellow form placed in chart (order not valid for inpatient use) Yellow form placed in chart (order not valid for inpatient use) Yellow form placed in chart (order not valid for inpatient use) Yellow form placed in chart (order not valid for inpatient use)    Tobacco Social History   Tobacco Use  Smoking Status Former Smoker  . Years: 6.00  Smokeless Tobacco Never Used     Counseling given: Not Answered   Clinical  Intake:  Pre-visit preparation completed: No  Pain : No/denies pain     Nutritional Risks: None Diabetes: No  How often do you need to have someone help you when you read instructions, pamphlets, or other written materials from your doctor or pharmacy?: 3 - Sometimes What is the last grade level you completed in school?: College  Interpreter Needed?: No  Information entered by :: Tyson Dense, RN  Past Medical History:  Diagnosis Date  . Actinic keratosis   . Anemia of chronic disease 02/10/2016  . Arthritis   . BPH (benign prostatic hyperplasia)   . BPH without obstruction/lower urinary tract symptoms 08/31/2015  . Cancer ([place])   . Cataract cortical, senile   . Dementia without behavioral disturbance 08/31/2015  . Esophageal abnormality   . GERD (gastroesophageal reflux disease) 01/10/2016  . Gout   . Hyperlipidemia, unspecified   . Hypertension   . Malnutrition of moderate degree 09/24/2015  . 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 Pneumonia 08/30/2015  . PVD (peripheral vascular disease) ([place])    Past Surgical History:  Procedure Laterality Date  . ESOPHAGOGASTRODUODENOSCOPY N/A 09/23/2015   Procedure: ESOPHAGOGASTRODUODENOSCOPY (EGD);  Surgeon: Arta Silence, MD;  Location: [facility];  Service: Endoscopy;  Laterality: N/A;  . ESOPHAGOGASTRODUODENOSCOPY     01/15/2009 , 10/29/2009  . ESOPHAGOGASTRODUODENOSCOPY (EGD) WITH PROPOFOL N/A 08/31/2015   Procedure:  ESOPHAGOGASTRODUODENOSCOPY (EGD) WITH PROPOFOL;  Surgeon: Wonda Horner, MD;  Location: [facility];  Service: Endoscopy;  Laterality: N/A;  . FOOT SURGERY    . JOINT REPLACEMENT     Hip  . TONSILLECTOMY     Family History  Problem Relation Age of Onset  . Heart disease Brother        CHF  . Heart disease Brother   . Hypertension Brother   . Colon polyps Son    Social History   Socioeconomic History  . Marital status: Single    Spouse name: Not on file  . Number of children: Not on file  . Years of education: Not on  file  . Highest education level: Not on file  Occupational History  . Occupation: retired Tour manager  . Financial resource strain: Not hard at all  . Food insecurity:    Worry: Never true    Inability: Never true  . Transportation needs:    Medical: No    Non-medical: No  Tobacco Use  . Smoking status: Former Smoker    Years: 6.00  . Smokeless tobacco: Never Used  Substance and Sexual Activity  . Alcohol use: No  . Drug use: No  . Sexual activity: Never  Lifestyle  . Physical activity:    Days per week: 0 days    Minutes per session: 0 min  . Stress: Not at all  Relationships  . Social connections:    Talks on phone: Never    Gets together: Once a week    Attends religious service: Never    Active member of club or organization: No    Attends meetings of clubs or organizations: Never    Relationship status: Never married  Other Topics Concern  . Not on file  Social History Narrative   Admitted to [facility] 09/07/15   Widowed   Former Smoker   Alcohol none   DNR    Outpatient Encounter Medications as of 12/16/2017  Medication Sig  . acetaminophen (TYLENOL) 325 MG tablet Take 650 mg by mouth every 12 (twelve) hours.  Marland Kitchen allopurinol (ZYLOPRIM) 300 MG tablet Take 300 mg by mouth daily.  Marland Kitchen aspirin EC 81 MG tablet Take 81 mg by mouth daily.  Marland Kitchen donepezil (ARICEPT) 5 MG tablet Take 5 mg by mouth daily.   . finasteride (PROSCAR) 5 MG tablet Take 5 mg by mouth daily.  Marland Kitchen ipratropium-albuterol (DUONEB) 0.5-2.5 (3) MG/3ML SOLN Take 3 mLs by nebulization. Inhale 1 vial via Neb twice a day for congestion  . Melatonin 3 MG TABS Take 1 tablet by mouth at bedtime.  . mirtazapine (REMERON) 7.5 MG tablet Take 7.5 mg by mouth at bedtime.  . Multiple Vitamins-Minerals (MULTIVITAMIN ADULTS PO) Take 1 tablet by mouth daily.  . Nutritional Supplements (NUTRITIONAL SUPPLEMENT PO) Take by mouth. Initiate 4oz Medpass QID ( if resident does not like may resume Ensure)   . NUTRITIONAL SUPPLEMENTS PO Magic cup with meals as supplement  . Polyethyl Glycol-Propyl Glycol (SYSTANE) 0.4-0.3 % SOLN Apply 1 drop to eye 3 (three) times daily.  . polyethylene glycol (MIRALAX / GLYCOLAX) packet Take 17 g by mouth daily. Hold for diarrhea  . Vitamin D, Ergocalciferol, (DRISDOL) 50000 units CAPS capsule Take 50,000 Units by mouth every 30 (thirty) days. on the 7th   No facility-administered encounter medications on file as of 12/16/2017.     Activities of Daily Living In your present state of health, do you have  any difficulty performing the following activities: 12/16/2017 12/17/2016  Hearing? Tempie Donning  Vision? N Y  Difficulty concentrating or making decisions? Tempie Donning  Walking or climbing stairs? Y Y  Dressing or bathing? Y Y  Doing errands, shopping? Tempie Donning  Preparing Food and eating ? Y Y  Using the Toilet? Y Y  In the past six months, have you accidently leaked urine? Y Y  Do you have problems with loss of bowel control? Y Y  Managing your Medications? Y Y  Managing your Finances? Tempie Donning  Housekeeping or managing your Housekeeping? Tempie Donning  Some recent data might be hidden    Patient Care Team: [name], MD as PCP - General (Internal Medicine)   Assessment:   This is a routine wellness examination for Alvy.  Exercise Activities and Dietary recommendations Current Exercise Habits: The patient does not participate in regular exercise at present, Exercise limited by: orthopedic condition(s)  Goals    None      Fall Risk Fall Risk  12/16/2017 12/17/2016  Falls in the past year? No No   Is the patient's home free of loose throw rugs in walkways, pet beds, electrical cords, etc?   yes      Grab bars in the bathroom? yes      Handrails on the stairs?   yes      Adequate lighting?   yes   Depression Screen PHQ 2/9 Scores 12/16/2017 12/17/2016  PHQ - 2 Score 0 0    Cognitive Function     6CIT Screen 12/16/2017 12/17/2016  What Year? 4 points 4 points  What  month? 3 points 3 points  What time? 0 points 0 points  Count back from 20 0 points 0 points  Months in reverse 4 points 4 points  Repeat phrase 6 points 10 points  Total Score 17 21    Immunization History  Administered Date(s) Administered  . Influenza Split 04/28/2014  . Influenza-Unspecified 03/06/2012, 02/21/2016, 03/06/2017  . PPD Test 09/08/2015, 09/26/2015  . Pneumococcal Conjugate-13 04/13/2017    Qualifies for Shingles Vaccine? Not in past records  Screening Tests Health Maintenance  Topic Date Due  . TETANUS/TDAP  05/27/2023 (Originally 08/26/1933)  . INFLUENZA VACCINE  12/24/2017  . PNA vac Low Risk Adult (2 of 2 - PPSV23) 04/13/2018   Cancer Screenings: Lung: Low Dose CT Chest recommended if Age 7-80 years, 30 pack-year currently smoking OR have quit w/in 15years. Patient does not qualify. Colorectal: up to date  Additional Screenings:  Hepatitis C Screening: declined  TDAP due: ordered    Plan:    I have personally reviewed and addressed the Medicare Annual Wellness questionnaire and have noted the following in the patient's chart:  A. Medical and social history B. Use of alcohol, tobacco or illicit drugs  C. Current medications and supplements D. Functional ability and status E.  Nutritional status F.  Physical activity G. Advance directives H. List of other physicians I.  Hospitalizations, surgeries, and ER visits in previous 12 months J.  Parks to include hearing, vision, cognitive, depression L. Referrals and appointments - none  In addition, I have reviewed and discussed with patient certain preventive protocols, quality metrics, and best practice recommendations. A written personalized care plan for preventive services as well as general preventive health recommendations were provided to patient.  See attached scanned questionnaire for additional information.   Signed,   Tyson Dense, RN Nurse Health Advisor  Patient  Concerns: none

## 2017-12-16 NOTE — Patient Instructions (Signed)
Jack Cantrell , Thank you for taking time to come for your Medicare Wellness Visit. I appreciate your ongoing commitment to your health goals. Please review the following plan we discussed and let me know if I can assist you in the future.   Screening recommendations/referrals: Colonoscopy excluded, over age 82 Recommended yearly ophthalmology/optometry visit for glaucoma screening and checkup Recommended yearly dental visit for hygiene and checkup  Vaccinations: Influenza vaccine up to date, due 2019 fall season Pneumococcal vaccine up to date, completed Tdap vaccine due, declined Shingles vaccine not in past records    Advanced directives: in chart  Conditions/risks identified: none  Next appointment: Dr. [name] makes rounds  Preventive Care 18 Years and Older, Male Preventive care refers to lifestyle choices and visits with your health care provider that can promote health and wellness. What does preventive care include?  A yearly physical exam. This is also called an annual well check.  Dental exams once or twice a year.  Routine eye exams. Ask your health care provider how often you should have your eyes checked.  Personal lifestyle choices, including:  Daily care of your teeth and gums.  Regular physical activity.  Eating a healthy diet.  Avoiding tobacco and drug use.  Limiting alcohol use.  Practicing safe sex.  Taking low doses of aspirin every day.  Taking vitamin and mineral supplements as recommended by your health care provider. What happens during an annual well check? The services and screenings done by your health care provider during your annual well check will depend on your age, overall health, lifestyle risk factors, and family history of disease. Counseling  Your health care provider may ask you questions about your:  Alcohol use.  Tobacco use.  Drug use.  Emotional well-being.  Home and relationship well-being.  Sexual  activity.  Eating habits.  History of falls.  Memory and ability to understand (cognition).  Work and work Statistician. Screening  You may have the following tests or measurements:  Height, weight, and BMI.  Blood pressure.  Lipid and cholesterol levels. These may be checked every 5 years, or more frequently if you are over 25 years old.  Skin check.  Lung cancer screening. You may have this screening every year starting at age 49 if you have a 30-pack-year history of smoking and currently smoke or have quit within the past 15 years.  Fecal occult blood test (FOBT) of the stool. You may have this test every year starting at age 7.  Flexible sigmoidoscopy or colonoscopy. You may have a sigmoidoscopy every 5 years or a colonoscopy every 10 years starting at age 73.  Prostate cancer screening. Recommendations will vary depending on your family history and other risks.  Hepatitis C blood test.  Hepatitis B blood test.  Sexually transmitted disease (STD) testing.  Diabetes screening. This is done by checking your blood sugar (glucose) after you have not eaten for a while (fasting). You may have this done every 1-3 years.  Abdominal aortic aneurysm (AAA) screening. You may need this if you are a current or former smoker.  Osteoporosis. You may be screened starting at age 27 if you are at high risk. Talk with your health care provider about your test results, treatment options, and if necessary, the need for more tests. Vaccines  Your health care provider may recommend certain vaccines, such as:  Influenza vaccine. This is recommended every year.  Tetanus, diphtheria, and acellular pertussis (Tdap, Td) vaccine. You may need a Td booster every  10 years.  Zoster vaccine. You may need this after age 76.  Pneumococcal 13-valent conjugate (PCV13) vaccine. One dose is recommended after age 82.  Pneumococcal polysaccharide (PPSV23) vaccine. One dose is recommended after age  87. Talk to your health care provider about which screenings and vaccines you need and how often you need them. This information is not intended to replace advice given to you by your health care provider. Make sure you discuss any questions you have with your health care provider. Document Released: 06/08/2015 Document Revised: 01/30/2016 Document Reviewed: 03/13/2015 Elsevier Interactive Patient Education  2017 [place] Prevention in the Home Falls can cause injuries. They can happen to people of all ages. There are many things you can do to make your home safe and to help prevent falls. What can I do on the outside of my home?  Regularly fix the edges of walkways and driveways and fix any cracks.  Remove anything that might make you trip as you walk through a door, such as a raised step or threshold.  Trim any bushes or trees on the path to your home.  Use bright outdoor lighting.  Clear any walking paths of anything that might make someone trip, such as rocks or tools.  Regularly check to see if handrails are loose or broken. Make sure that both sides of any steps have handrails.  Any raised decks and porches should have guardrails on the edges.  Have any leaves, snow, or ice cleared regularly.  Use sand or salt on walking paths during winter.  Clean up any spills in your garage right away. This includes oil or grease spills. What can I do in the bathroom?  Use night lights.  Install grab bars by the toilet and in the tub and shower. Do not use towel bars as grab bars.  Use non-skid mats or decals in the tub or shower.  If you need to sit down in the shower, use a plastic, non-slip stool.  Keep the floor dry. Clean up any water that spills on the floor as soon as it happens.  Remove soap buildup in the tub or shower regularly.  Attach bath mats securely with double-sided non-slip rug tape.  Do not have throw rugs and other things on the floor that can make  you trip. What can I do in the bedroom?  Use night lights.  Make sure that you have a light by your bed that is easy to reach.  Do not use any sheets or blankets that are too big for your bed. They should not hang down onto the floor.  Have a firm chair that has side arms. You can use this for support while you get dressed.  Do not have throw rugs and other things on the floor that can make you trip. What can I do in the kitchen?  Clean up any spills right away.  Avoid walking on wet floors.  Keep items that you use a lot in easy-to-reach places.  If you need to reach something above you, use a strong step stool that has a grab bar.  Keep electrical cords out of the way.  Do not use floor polish or wax that makes floors slippery. If you must use wax, use non-skid floor wax.  Do not have throw rugs and other things on the floor that can make you trip. What can I do with my stairs?  Do not leave any items on the stairs.  Make sure  that there are handrails on both sides of the stairs and use them. Fix handrails that are broken or loose. Make sure that handrails are as long as the stairways.  Check any carpeting to make sure that it is firmly attached to the stairs. Fix any carpet that is loose or worn.  Avoid having throw rugs at the top or bottom of the stairs. If you do have throw rugs, attach them to the floor with carpet tape.  Make sure that you have a light switch at the top of the stairs and the bottom of the stairs. If you do not have them, ask someone to add them for you. What else can I do to help prevent falls?  Wear shoes that:  Do not have high heels.  Have rubber bottoms.  Are comfortable and fit you well.  Are closed at the toe. Do not wear sandals.  If you use a stepladder:  Make sure that it is fully opened. Do not climb a closed stepladder.  Make sure that both sides of the stepladder are locked into place.  Ask someone to hold it for you, if  possible.  Clearly mark and make sure that you can see:  Any grab bars or handrails.  First and last steps.  Where the edge of each step is.  Use tools that help you move around (mobility aids) if they are needed. These include:  Canes.  Walkers.  Scooters.  Crutches.  Turn on the lights when you go into a dark area. Replace any light bulbs as soon as they burn out.  Set up your furniture so you have a clear path. Avoid moving your furniture around.  If any of your floors are uneven, fix them.  If there are any pets around you, be aware of where they are.  Review your medicines with your doctor. Some medicines can make you feel dizzy. This can increase your chance of falling. Ask your doctor what other things that you can do to help prevent falls. This information is not intended to replace advice given to you by your health care provider. Make sure you discuss any questions you have with your health care provider. Document Released: 03/08/2009 Document Revised: 10/18/2015 Document Reviewed: 06/16/2014 Elsevier Interactive Patient Education  2017 [employer].

## 2018-01-03 ENCOUNTER — Encounter: Payer: Self-pay | Admitting: Internal Medicine

## 2018-01-03 NOTE — Assessment & Plan Note (Signed)
Chronic and stable; continue Aricept 5 mg daily 

## 2018-01-03 NOTE — Assessment & Plan Note (Signed)
No recent problems or infection; continue Proscar 5 mg daily

## 2018-01-03 NOTE — Assessment & Plan Note (Signed)
Continues without flares; continue allopurinol 300 mg daily

## 2018-01-14 ENCOUNTER — Encounter: Payer: Self-pay | Admitting: Internal Medicine

## 2018-01-14 ENCOUNTER — Non-Acute Institutional Stay (SKILLED_NURSING_FACILITY): Payer: Medicare Other | Admitting: Internal Medicine

## 2018-01-14 DIAGNOSIS — I739 Peripheral vascular disease, unspecified: Secondary | ICD-10-CM | POA: Diagnosis not present

## 2018-01-14 DIAGNOSIS — N400 Enlarged prostate without lower urinary tract symptoms: Secondary | ICD-10-CM

## 2018-01-14 DIAGNOSIS — D638 Anemia in other chronic diseases classified elsewhere: Secondary | ICD-10-CM

## 2018-01-14 NOTE — Progress Notes (Signed)
Location:  [name] Room Number: 993Z Place of Service:  SNF (31)  Jack Delaine. [name], MD  Patient Care Team: [name], MD as PCP - General (Internal Medicine)  Extended Emergency Contact Information Primary Emergency Contact: Cantrell,Jack Address: [address]          [place], [place] [address] Jack Cantrell of [place] Phone: [phone] Mobile Phone: [phone] Relation: Son    Allergies: Patient has no known allergies.  Chief Complaint  Patient presents with  . Medical Management of Chronic Issues    Routine Visit    HPI: Patient is 84 y.o. male who is being seen for routine issues of peripheral vascular disease, anemia of chronic disease, and BPH without obstruction.  Past Medical History:  Diagnosis Date  . Actinic keratosis   . Anemia of chronic disease 02/10/2016  . Arthritis   . BPH (benign prostatic hyperplasia)   . BPH without obstruction/lower urinary tract symptoms 08/31/2015  . Cancer ([place])   . Cataract cortical, senile   . Dementia without behavioral disturbance 08/31/2015  . Esophageal abnormality   . GERD (gastroesophageal reflux disease) 01/10/2016  . Gout   . Hyperlipidemia, unspecified   . Hypertension   . Malnutrition of moderate degree 09/24/2015  . 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 Pneumonia 08/30/2015  . PVD (peripheral vascular disease) ([place])     Past Surgical History:  Procedure Laterality Date  . ESOPHAGOGASTRODUODENOSCOPY N/A 09/23/2015   Procedure: ESOPHAGOGASTRODUODENOSCOPY (EGD);  Surgeon: Arta Silence, MD;  Location: [facility];  Service: Endoscopy;  Laterality: N/A;  . ESOPHAGOGASTRODUODENOSCOPY     01/15/2009 , 10/29/2009  . ESOPHAGOGASTRODUODENOSCOPY (EGD) WITH PROPOFOL N/A 08/31/2015   Procedure: ESOPHAGOGASTRODUODENOSCOPY (EGD) WITH PROPOFOL;  Surgeon: Wonda Horner, MD;  Location: [facility];  Service: Endoscopy;  Laterality: N/A;  . FOOT SURGERY    . JOINT REPLACEMENT     Hip  .  TONSILLECTOMY      Allergies as of 01/14/2018   No Known Allergies     Medication List        Accurate as of 01/14/18 11:59 PM. Always use your most recent med list.          acetaminophen 325 MG tablet Commonly known as:  TYLENOL Take 650 mg by mouth every 12 (twelve) hours.   allopurinol 300 MG tablet Commonly known as:  ZYLOPRIM Take 300 mg by mouth daily.   aspirin EC 81 MG tablet Take 81 mg by mouth daily.   donepezil 5 MG tablet Commonly known as:  ARICEPT Take 5 mg by mouth daily.   finasteride 5 MG tablet Commonly known as:  PROSCAR Take 5 mg by mouth daily.   ipratropium-albuterol 0.5-2.5 (3) MG/3ML Soln Commonly known as:  DUONEB Take 3 mLs by nebulization. Inhale 1 vial via Neb twice a day for congestion   LORazepam 0.5 MG tablet Commonly known as:  ATIVAN Take 0.5 mg by mouth. Take half tablet(0.25 mg) by mouth daily for agitaton related to end of life.   Melatonin 3 MG Tabs Take 1 tablet by mouth at bedtime.   mirtazapine 7.5 MG tablet Commonly known as:  REMERON Take 7.5 mg by mouth at bedtime.   MULTIVITAMIN ADULTS PO Take 1 tablet by mouth daily.   NUTRITIONAL SUPPLEMENT PO Take by mouth. Initiate 4oz Medpass QID ( if resident does not like may resume Ensure)   NUTRITIONAL SUPPLEMENTS PO Magic cup with meals as supplement   polyethylene glycol packet Commonly  known as:  MIRALAX / GLYCOLAX Take 17 g by mouth daily. Hold for diarrhea   SYSTANE 0.4-0.3 % Soln Generic drug:  Polyethyl Glycol-Propyl Glycol Apply 1 drop to eye 3 (three) times daily.   Vitamin D (Ergocalciferol) 50000 units Caps capsule Commonly known as:  DRISDOL Take 50,000 Units by mouth every 30 (thirty) days. on the 7th       No orders of the defined types were placed in this encounter.   Immunization History  Administered Date(s) Administered  . Influenza Split 04/28/2014  . Influenza-Unspecified 03/06/2012, 02/21/2016, 03/06/2017  . PPD Test 09/08/2015,  09/26/2015  . Pneumococcal Conjugate-13 04/13/2017    Social History   Tobacco Use  . Smoking status: Former Smoker    Years: 6.00  . Smokeless tobacco: Never Used  Substance Use Topics  . Alcohol use: No    Review of Systems  DATA OBTAINED: from patient-limited; nursing- no acute concerns GENERAL:  no fevers, fatigue, appetite changes SKIN: No itching, rash HEENT: No complaint RESPIRATORY: No cough, wheezing, SOB CARDIAC: No chest pain, palpitations, lower extremity edema  GI: No abdominal pain, No N/V/D or constipation, No heartburn or reflux  GU: No dysuria, frequency or urgency, or incontinence  MUSCULOSKELETAL: No unrelieved bone/joint pain NEUROLOGIC: No headache, dizziness  PSYCHIATRIC: No overt anxiety or sadness  Vitals:   01/14/18 1129  BP: (!) 105/59  Pulse: (!) 56  Resp: 16  Temp: (!) 96.8 F (36 C)   Body mass index is 17.89 kg/m. Physical Exam  GENERAL APPEARANCE: Alert, currently conversant, No acute distress  SKIN: No diaphoresis rash HEENT: Unremarkable RESPIRATORY: Breathing is even, unlabored. Lung sounds are clear   CARDIOVASCULAR: Heart RRR no murmurs, rubs or gallops. No peripheral edema  GASTROINTESTINAL: Abdomen is soft, non-tender, not distended w/ normal bowel sounds.  GENITOURINARY: Bladder non tender, not distended  MUSCULOSKELETAL: No abnormal joints or musculature NEUROLOGIC: Cranial nerves 2-12 grossly intact. Moves all extremities PSYCHIATRIC: Mood and affect with dementia, no behavioral issues  Patient Active Problem List   Diagnosis Date Noted  . Vitamin D deficiency 11/26/2017  . Insomnia 11/26/2017  . BPH (benign prostatic hyperplasia)   . Arthritis   . Hyperlipidemia, unspecified   . Cataract cortical, senile   . PVD (peripheral vascular disease) ([place])   . Actinic keratosis   . Hypertension   . Anemia of chronic disease 02/10/2016  . GERD (gastroesophageal reflux disease) 01/10/2016  . Abdominal pain 10/06/2015  .  Acute encephalopathy 09/29/2015  . HTN (hypertension) 09/29/2015  . Altered mental status   . Acute respiratory failure ([place]) 09/24/2015  . Malnutrition of moderate degree 09/24/2015  . Dysphagia 09/16/2015  . Arm swelling 09/16/2015  . Gout 09/16/2015  . Pressure ulcer 08/31/2015  . BPH without obstruction/lower urinary tract symptoms 08/31/2015  . Dementia without behavioral disturbance 08/31/2015  . 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 Sepsis ([place]) 08/31/2015  . Pneumonia 08/30/2015  . CAP (community acquired pneumonia) 08/30/2015    CMP     Component Value Date/Time   NA 145 07/22/2017   K 4.0 07/22/2017   CL 107 09/25/2015 0519   CO2 28 09/25/2015 0519   GLUCOSE 94 09/25/2015 0519   BUN 22 (A) 07/22/2017   CREATININE 0.8 07/22/2017   CREATININE 0.86 09/25/2015 0519   CALCIUM 10.8 (H) 09/25/2015 0519   PROT 6.3 (L) 09/25/2015 0519   ALBUMIN 3.2 (L) 09/25/2015 0519   AST 20 11/28/2016   ALT 9 (A) 11/28/2016   ALKPHOS 108 11/28/2016  BILITOT 0.6 09/25/2015 0519   GFRNONAA >60 09/25/2015 0519   GFRAA >60 09/25/2015 0519   Recent Labs    05/01/17 07/22/17  NA 143 145  K 4.2 4.0  BUN 19 22*  CREATININE 0.6 0.8   No results for input(s): AST, ALT, ALKPHOS, BILITOT, PROT, ALBUMIN in the last 8760 hours. Recent Labs    05/01/17 07/22/17  WBC 8.9 12.6  HGB 14.1 12.3*  HCT 44 37*  PLT 201 157   No results for input(s): CHOL, LDLCALC, TRIG in the last 8760 hours.  Invalid input(s): HCL No results found for: [facility] Lab Results  Component Value Date   TSH 1.43 02/13/2016   Lab Results  Component Value Date   HGBA1C 6.2 02/20/2016   Lab Results  Component Value Date   CHOL 171 02/20/2016   HDL 27 (A) 02/20/2016   LDLCALC 98 02/20/2016   TRIG 232 (A) 02/20/2016    Significant Diagnostic Results in last 30 days:  No results found.  Assessment and Plan  PVD (peripheral vascular disease) ([place]) No reported lesions; continue ASA 81 mg daily  Anemia  of chronic disease Gl[place]in 12.3 which is stable from recent priors; monitor intervals  BPH without obstruction/lower urinary tract symptoms On Proscar 5 mg daily; no infections reported; continue current therapy    Webb Silversmith D. [name], MD

## 2018-01-16 ENCOUNTER — Encounter: Payer: Self-pay | Admitting: Internal Medicine

## 2018-01-16 NOTE — Assessment & Plan Note (Signed)
Globin 12.3 which is stable from recent priors; monitor intervals

## 2018-01-16 NOTE — Assessment & Plan Note (Signed)
No reported lesions; continue ASA 81 mg daily

## 2018-01-16 NOTE — Assessment & Plan Note (Signed)
On Proscar 5 mg daily; no infections reported; continue current therapy

## 2018-02-23 DEATH — deceased
